# Patient Record
Sex: Male | Born: 1960
Health system: Southern US, Community
[De-identification: ages and names within clinical notes are randomized; demographics above are authoritative.]

## PROBLEM LIST (undated history)

## (undated) DIAGNOSIS — I10 Essential (primary) hypertension: Secondary | ICD-10-CM

## (undated) DIAGNOSIS — E119 Type 2 diabetes mellitus without complications: Secondary | ICD-10-CM

## (undated) DIAGNOSIS — C169 Malignant neoplasm of stomach, unspecified: Secondary | ICD-10-CM

## (undated) DIAGNOSIS — E785 Hyperlipidemia, unspecified: Secondary | ICD-10-CM

## (undated) DIAGNOSIS — C801 Malignant (primary) neoplasm, unspecified: Secondary | ICD-10-CM

## (undated) HISTORY — DX: Malignant (primary) neoplasm, unspecified: C80.1

## (undated) HISTORY — DX: Malignant neoplasm of stomach, unspecified: C16.9

## (undated) HISTORY — DX: Essential (primary) hypertension: I10

## (undated) HISTORY — PX: LAPAROSCOPIC SIGMOID COLECTOMY: SHX5928

## (undated) HISTORY — PX: LAPAROSCOPIC CHOLECYSTECTOMY: SUR755

## (undated) HISTORY — PX: APPENDECTOMY: SHX54

---

## 1998-06-03 ENCOUNTER — Emergency Department (HOSPITAL_COMMUNITY): Admission: EM | Admit: 1998-06-03 | Discharge: 1998-06-03 | Payer: Self-pay | Admitting: Emergency Medicine

## 1998-06-04 ENCOUNTER — Encounter: Admission: RE | Admit: 1998-06-04 | Discharge: 1998-09-02 | Payer: Self-pay | Admitting: Internal Medicine

## 1999-01-26 ENCOUNTER — Emergency Department (HOSPITAL_COMMUNITY): Admission: EM | Admit: 1999-01-26 | Discharge: 1999-01-26 | Payer: Self-pay

## 1999-12-29 ENCOUNTER — Emergency Department (HOSPITAL_COMMUNITY): Admission: EM | Admit: 1999-12-29 | Discharge: 1999-12-29 | Payer: Self-pay | Admitting: Emergency Medicine

## 2000-04-20 ENCOUNTER — Emergency Department (HOSPITAL_COMMUNITY): Admission: EM | Admit: 2000-04-20 | Discharge: 2000-04-20 | Payer: Self-pay | Admitting: Emergency Medicine

## 2000-04-20 ENCOUNTER — Encounter: Payer: Self-pay | Admitting: Emergency Medicine

## 2006-05-10 ENCOUNTER — Encounter: Admission: RE | Admit: 2006-05-10 | Discharge: 2006-05-10 | Payer: Self-pay | Admitting: General Surgery

## 2006-05-28 ENCOUNTER — Encounter: Admission: RE | Admit: 2006-05-28 | Discharge: 2006-05-28 | Payer: Self-pay | Admitting: General Surgery

## 2006-07-24 ENCOUNTER — Encounter (INDEPENDENT_AMBULATORY_CARE_PROVIDER_SITE_OTHER): Payer: Self-pay | Admitting: Specialist

## 2006-07-24 ENCOUNTER — Inpatient Hospital Stay (HOSPITAL_COMMUNITY): Admission: RE | Admit: 2006-07-24 | Discharge: 2006-07-31 | Payer: Self-pay | Admitting: General Surgery

## 2006-08-04 ENCOUNTER — Emergency Department (HOSPITAL_COMMUNITY): Admission: EM | Admit: 2006-08-04 | Discharge: 2006-08-04 | Payer: Self-pay | Admitting: Emergency Medicine

## 2007-03-14 ENCOUNTER — Emergency Department (HOSPITAL_COMMUNITY): Admission: EM | Admit: 2007-03-14 | Discharge: 2007-03-15 | Payer: Self-pay | Admitting: Emergency Medicine

## 2011-03-17 NOTE — Op Note (Signed)
Walter Phillips, STEARNS NO.:  [id number]   MEDICAL RECORD NO.:  [id number]          PATIENT TYPE:  INP   LOCATION:  0006                         FACILITY:  [facility]   PHYSICIAN:  Gita Kudo, M.D. DATE OF BIRTH:  1961/03/23   DATE OF PROCEDURE:  07/24/2006  DATE OF DISCHARGE:                                 OPERATIVE REPORT   OPERATIVE PROCEDURE:  Sigmoid colon resection.   SURGEON:  Dr. Maryagnes Amos.   ASSISTANT:  Dr. Derrell Lolling.   ANESTHESIA:  General endotracheal.   PREOPERATIVE DIAGNOSIS:  Sigmoid diverticulitis.   POSTOPERATIVE DIAGNOSIS:  Sigmoid diverticulitis with perforation and  adherence to the bladder, no apparent bladder fistula.   CLINICAL SUMMARY:  A 50 year old male brought in for elective surgery.  At  the laparoscopic cholecystectomy earlier this year, a mass was felt.  He was  asymptomatic.  It was worked up and felt to be a sigmoid diverticular  abscess with a little perforation.  He comes in for elective surgery now  approximately six to eight weeks later.   OPERATIVE FINDINGS:  The patient intense inflammatory mass in the sigmoid  colon that was very adherent to the bladder.   The pathologist inspected after it was removed and felt it was benign and  not malignant.  His other laparotomy was normal.  The gallbladder was  surgically absent, and there were markedly few adhesions.  She had a  previous appendix and very little adhesions from that also.  The ureters  were identified on both sides.  There was no leakage of blue dye that was  injected intravenously to check on bladder integrity.   OPERATIVE PROCEDURE:  Under satisfactory general endotracheal anesthesia,  having received both Lovenox and antibiotics preoperative, the patient was  positioned, prepped and draped in standard fashion.  PAS hose were applied.   The Foley catheter and nasogastric tubes had been inserted.  A midline  incision was made, and laparotomy revealed the findings  above.   The lateral reflections of the sigmoid were taken down with cautery.  The  colon was mobilized.  I removed as much of the colon from the bladder as  possible by gentle sharp and blunt dissection and then ended up using a  scalpel to cut away the inflammatory mass without entering the bladder.  There was some bleeding in the bladder that was controlled with suture.   Then, the colon was resected.  Proximally, the bowel was divided between  bowel clamps and then the mesentery taken between clamps and ties of 2-0  silk down to beyond the inflammatory mass.  Stay sutures of silk were placed  and the bowel clamped and removed.  It was inspected grossly and sent for  pathology.  Then, an anastomosis was performed with a single layer of 3-0  and 2-0 silk sutures of mattress, Gambee and simple type.  When completed,  the anastomosis lay without tension, had good blood supply and was patent.   There was no real mesentery to approximate.  The abdomen was lavaged with  saline.  The bladder was inspected and no  leakage of blue dye noted.  Two  Vicryl sutures were placed to help cover over the denuded area of bladder.  Then, the pelvis was drained with a #19 Blake drain, let out a right lower  quadrant stab wound.  The anastomosis did not lie the near the bladder  adherence.  It was actually placed distal to it in the pelvis.  Then, the  abdomen was closed in a single layer with running double-stranded #1 PDS  suture and the skin edges approximated with staples.  There were no  complications, and the sponge and needle counts were correct.           ______________________________  Gita Kudo, M.D.     MRL/MEDQ  D:  07/24/2006  T:  07/26/2006  Job:  [id number]

## 2011-03-17 NOTE — Discharge Summary (Signed)
Walter Phillips, PERNELL NO.:  [id number]   MEDICAL RECORD NO.:  [id number]          PATIENT TYPE:  INP   LOCATION:  1613                         FACILITY:  [facility]   PHYSICIAN:  Gita Kudo, M.D. DATE OF BIRTH:  [birth year]   DATE OF ADMISSION:  07/24/2006  DATE OF DISCHARGE:  07/31/2006                                 DISCHARGE SUMMARY   CHIEF COMPLAINT:  Sigmoid diverticulitis with some bladder symptoms.   HISTORY OF PRESENT ILLNESS:  [name] is a very nice 50 year old man who  underwent a laparoscopic cholecystectomy at [facility] on May 07, 2006.  The patient's exam at that time revealed a mass, and the laparoscopic  cholecystectomy was completed without complication.  The intraoperative  cholangiogram showed there might of been a tiny stone in the distal duct.  He was discharged to be followed as an outpatient.  In the interim, he  underwent CT scan, and we did indeed see severe diverticular disease with a  mass that looked very close to the bladder.  His liver functions remained  normal, and we just followed him up with a BE in preparation for surgery,  and he comes in now for that procedure having had a good outpatient bowel  prep.   LABORATORY STUDIES:  Pathology: Section of sigmoid colon with severe  diverticulitis and a pericolonic abscess.  No malignancy, four benign lymph  nodes.  Initial hemoglobin 15, hematocrit 43, white count 6300.  Followup 14  and 39.  His CMETs were totally normal except for a slightly low postop of  sodium that was corrected.  Glucose were slightly elevated at 105 and 117.  His LFTs showed alkaline phosphatase of 130 and the bilirubin of 1.3.   HOSPITAL COURSE:  On the morning of admission, the patient underwent a  sigmoid resection.  Of note is the fact that the colon was quite adherent to  the bladder, and it had to be removed by scalpel dissection.  The report  showed that it was benign.  The patient had a primary  anastomosis.  His  Foley catheter was left in for some time.  He continued to do quite well and  was improving to the point when he was discharged on the seventh postop day.  He had regained bowel function, was voiding, comfortable and had no problems  with his incision.  He will be followed as an outpatient.   DISCHARGE DIAGNOSES:  1. Sigmoid diverticulitis with adherence to the bladder.  2. Slightly elevated liver function studies, approximately 4 months post      laparoscopic cholecystectomy with intraoperative cholangiogram.   OPERATIONS:  Sigmoid resection.   COMPLICATIONS:  Infections.   CONSULTATIONS:  None.   CONDITION ON DISCHARGE:  Good.           ______________________________  Gita Kudo, M.D.     MRL/MEDQ  D:  08/13/2006  T:  08/13/2006  Job:  [id number]   cc:   Gabriel Earing, M.D.  Fax: [phone]   Barbette Hair. Arlyce Dice, MD,FACG  [address]  [place]  [place] [address]

## 2014-01-26 ENCOUNTER — Other Ambulatory Visit (INDEPENDENT_AMBULATORY_CARE_PROVIDER_SITE_OTHER): Payer: Self-pay

## 2014-01-26 ENCOUNTER — Telehealth (INDEPENDENT_AMBULATORY_CARE_PROVIDER_SITE_OTHER): Payer: Self-pay

## 2014-01-26 ENCOUNTER — Ambulatory Visit (INDEPENDENT_AMBULATORY_CARE_PROVIDER_SITE_OTHER): Payer: 59 | Admitting: General Surgery

## 2014-01-26 ENCOUNTER — Encounter (INDEPENDENT_AMBULATORY_CARE_PROVIDER_SITE_OTHER): Payer: Self-pay | Admitting: General Surgery

## 2014-01-26 VITALS — BP 128/82 | HR 78 | Temp 97.8°F | Resp 16 | Ht 62.0 in | Wt 173.2 lb

## 2014-01-26 DIAGNOSIS — M6200 Separation of muscle (nontraumatic), unspecified site: Secondary | ICD-10-CM

## 2014-01-26 DIAGNOSIS — M6208 Separation of muscle (nontraumatic), other site: Secondary | ICD-10-CM

## 2014-01-26 NOTE — Progress Notes (Signed)
Patient ID: Walter Phillips, male   DOB: [birth year], 53 y.o.   MRN: [id number]  Chief Complaint  Patient presents with  . New Evaluation    Eval abd Hernia    HPI Walter Phillips is a 53 y.o. male.  The patient is a 53 year old Spanish-speaking male who is referred by Dr. Jimmye Norman for evaluation of a possible ventral hernia. The patient underwent and bowel resection in 2007. Patient has had no pain or the symptoms on his anterior abdominal wall.  HPI  Past Medical History  Diagnosis Date  . Cancer   . Stomach cancer     History reviewed. No pertinent past surgical history.  Family History  Problem Relation Age of Onset  . Cancer Father   . Cancer Sister 27    stomach    Social History History  Substance Use Topics  . Smoking status: Current Some Day Smoker -- 0.50 packs/day  . Smokeless tobacco: Not on file  . Alcohol Use: Yes     Comment: occ    No Known Allergies  Current Outpatient Prescriptions  Medication Sig Dispense Refill  . betamethasone dipropionate 0.05 % lotion       . cephALEXin (KEFLEX) 500 MG capsule       . clobetasol (TEMOVATE) 0.05 % external solution       . cyclobenzaprine (FLEXERIL) 10 MG tablet       . fenofibrate (TRICOR) 145 MG tablet       . glimepiride (AMARYL) 2 MG tablet       . metFORMIN (GLUCOPHAGE) 500 MG tablet       . naproxen (NAPROSYN) 500 MG tablet        No current facility-administered medications for this visit.    Review of Systems Review of Systems  Constitutional: Negative.   HENT: Negative.   Eyes: Negative.   Respiratory: Negative.   Cardiovascular: Negative.   Gastrointestinal: Negative.   Endocrine: Negative.   Neurological: Negative.     Blood pressure 128/82, pulse 78, temperature 97.8 F (36.6 C), temperature source Temporal, resp. rate 16, height 5\' 2"  (1.575 m), weight 173 lb 3.2 oz (78.563 kg).  Physical Exam Physical Exam  Constitutional: He is oriented to person, place, and time. He appears  well-developed and well-nourished.  HENT:  Head: Normocephalic and atraumatic.  Eyes: Conjunctivae and EOM are normal. Pupils are equal, round, and reactive to light.  Neck: Normal range of motion. Neck supple.  Cardiovascular: Normal rate, regular rhythm and normal heart sounds.   Pulmonary/Chest: Effort normal and breath sounds normal.  Abdominal: Soft. Bowel sounds are normal. He exhibits no distension and no mass. There is no tenderness. There is no rebound and no guarding. Hernia confirmed negative in the ventral area.    Well-healed incision  Musculoskeletal: Normal range of motion.  Neurological: He is alert and oriented to person, place, and time.  Skin: Skin is warm and dry.    Data Reviewed none  Assessment    53 year old male with rectus diastases, no incisional hernia     Plan    1. The patient follow up as needed        Rosario Jacks., Anne Hahn 01/26/2014, 3:15 PM

## 2014-01-26 NOTE — Telephone Encounter (Signed)
Office notes faxed to [facility] attn: Dr. Jimmye Norman

## 2014-02-16 ENCOUNTER — Encounter (INDEPENDENT_AMBULATORY_CARE_PROVIDER_SITE_OTHER): Payer: Self-pay

## 2014-04-17 DIAGNOSIS — K439 Ventral hernia without obstruction or gangrene: Secondary | ICD-10-CM | POA: Insufficient documentation

## 2014-05-27 HISTORY — PX: VENTRAL HERNIA REPAIR: SHX424

## 2018-04-24 ENCOUNTER — Ambulatory Visit: Payer: Self-pay | Admitting: Family Medicine

## 2019-10-15 ENCOUNTER — Emergency Department (HOSPITAL_COMMUNITY): Payer: HRSA Program

## 2019-10-15 ENCOUNTER — Other Ambulatory Visit: Payer: Self-pay

## 2019-10-15 ENCOUNTER — Encounter (HOSPITAL_COMMUNITY): Payer: Self-pay | Admitting: Internal Medicine

## 2019-10-15 ENCOUNTER — Inpatient Hospital Stay (HOSPITAL_COMMUNITY)
Admission: EM | Admit: 2019-10-15 | Discharge: 2019-10-23 | DRG: 177 | Disposition: A | Discharge status: Home / Self Care | Payer: HRSA Program | Attending: Internal Medicine | Admitting: Internal Medicine

## 2019-10-15 DIAGNOSIS — Z791 Long term (current) use of non-steroidal anti-inflammatories (NSAID): Secondary | ICD-10-CM

## 2019-10-15 DIAGNOSIS — U071 COVID-19: Secondary | ICD-10-CM | POA: Diagnosis present

## 2019-10-15 DIAGNOSIS — Z809 Family history of malignant neoplasm, unspecified: Secondary | ICD-10-CM

## 2019-10-15 DIAGNOSIS — J9611 Chronic respiratory failure with hypoxia: Secondary | ICD-10-CM

## 2019-10-15 DIAGNOSIS — Z933 Colostomy status: Secondary | ICD-10-CM | POA: Diagnosis not present

## 2019-10-15 DIAGNOSIS — Z7984 Long term (current) use of oral hypoglycemic drugs: Secondary | ICD-10-CM

## 2019-10-15 DIAGNOSIS — Z85028 Personal history of other malignant neoplasm of stomach: Secondary | ICD-10-CM

## 2019-10-15 DIAGNOSIS — J1282 Pneumonia due to coronavirus disease 2019: Secondary | ICD-10-CM

## 2019-10-15 DIAGNOSIS — Z8616 Personal history of COVID-19: Secondary | ICD-10-CM | POA: Diagnosis present

## 2019-10-15 DIAGNOSIS — J1289 Other viral pneumonia: Secondary | ICD-10-CM | POA: Diagnosis present

## 2019-10-15 DIAGNOSIS — A4189 Other specified sepsis: Secondary | ICD-10-CM | POA: Diagnosis present

## 2019-10-15 DIAGNOSIS — A419 Sepsis, unspecified organism: Secondary | ICD-10-CM | POA: Diagnosis present

## 2019-10-15 DIAGNOSIS — Z79899 Other long term (current) drug therapy: Secondary | ICD-10-CM

## 2019-10-15 DIAGNOSIS — E785 Hyperlipidemia, unspecified: Secondary | ICD-10-CM | POA: Diagnosis present

## 2019-10-15 DIAGNOSIS — Z23 Encounter for immunization: Secondary | ICD-10-CM

## 2019-10-15 DIAGNOSIS — F17210 Nicotine dependence, cigarettes, uncomplicated: Secondary | ICD-10-CM | POA: Diagnosis present

## 2019-10-15 DIAGNOSIS — R0902 Hypoxemia: Secondary | ICD-10-CM

## 2019-10-15 DIAGNOSIS — E1165 Type 2 diabetes mellitus with hyperglycemia: Secondary | ICD-10-CM

## 2019-10-15 DIAGNOSIS — J9601 Acute respiratory failure with hypoxia: Secondary | ICD-10-CM | POA: Diagnosis present

## 2019-10-15 DIAGNOSIS — Z9049 Acquired absence of other specified parts of digestive tract: Secondary | ICD-10-CM

## 2019-10-15 DIAGNOSIS — E119 Type 2 diabetes mellitus without complications: Secondary | ICD-10-CM

## 2019-10-15 HISTORY — DX: Hyperlipidemia, unspecified: E78.5

## 2019-10-15 HISTORY — DX: Chronic respiratory failure with hypoxia: J96.11

## 2019-10-15 HISTORY — DX: Type 2 diabetes mellitus without complications: E11.9

## 2019-10-15 LAB — POCT I-STAT 7, (LYTES, BLD GAS, ICA,H+H)
Acid-base deficit: 2 mmol/L (ref 0.0–2.0)
Bicarbonate: 19.8 mmol/L — ABNORMAL LOW (ref 20.0–28.0)
Calcium, Ion: 1.18 mmol/L (ref 1.15–1.40)
HCT: 39 % (ref 39.0–52.0)
Hemoglobin: 13.3 g/dL (ref 13.0–17.0)
O2 Saturation: 93 %
Patient temperature: 98.3
Potassium: 3.8 mmol/L (ref 3.5–5.1)
Sodium: 137 mmol/L (ref 135–145)
TCO2: 21 mmol/L — ABNORMAL LOW (ref 22–32)
pCO2 arterial: 26.9 mmHg — ABNORMAL LOW (ref 32.0–48.0)
pH, Arterial: 7.476 — ABNORMAL HIGH (ref 7.350–7.450)
pO2, Arterial: 60 mmHg — ABNORMAL LOW (ref 83.0–108.0)

## 2019-10-15 LAB — CBC WITH DIFFERENTIAL/PLATELET
Abs Immature Granulocytes: 0.21 10*3/uL — ABNORMAL HIGH (ref 0.00–0.07)
Basophils Absolute: 0 10*3/uL (ref 0.0–0.1)
Basophils Relative: 0 %
Eosinophils Absolute: 0 10*3/uL (ref 0.0–0.5)
Eosinophils Relative: 0 %
HCT: 42.7 % (ref 39.0–52.0)
Hemoglobin: 14.6 g/dL (ref 13.0–17.0)
Immature Granulocytes: 2 %
Lymphocytes Relative: 7 %
Lymphs Abs: 0.8 10*3/uL (ref 0.7–4.0)
MCH: 30.5 pg (ref 26.0–34.0)
MCHC: 34.2 g/dL (ref 30.0–36.0)
MCV: 89.1 fL (ref 80.0–100.0)
Monocytes Absolute: 0.3 10*3/uL (ref 0.1–1.0)
Monocytes Relative: 3 %
Neutro Abs: 10 10*3/uL — ABNORMAL HIGH (ref 1.7–7.7)
Neutrophils Relative %: 88 %
Platelets: 386 10*3/uL (ref 150–400)
RBC: 4.79 MIL/uL (ref 4.22–5.81)
RDW: 12.3 % (ref 11.5–15.5)
WBC: 11.3 10*3/uL — ABNORMAL HIGH (ref 4.0–10.5)
nRBC: 0.2 % (ref 0.0–0.2)

## 2019-10-15 LAB — GLUCOSE, CAPILLARY
Glucose-Capillary: 231 mg/dL — ABNORMAL HIGH (ref 70–99)
Glucose-Capillary: 243 mg/dL — ABNORMAL HIGH (ref 70–99)

## 2019-10-15 LAB — CBG MONITORING, ED
Glucose-Capillary: 129 mg/dL — ABNORMAL HIGH (ref 70–99)
Glucose-Capillary: 190 mg/dL — ABNORMAL HIGH (ref 70–99)

## 2019-10-15 LAB — RESPIRATORY PANEL BY RT PCR (FLU A&B, COVID)
Influenza A by PCR: NEGATIVE
Influenza B by PCR: NEGATIVE
SARS Coronavirus 2 by RT PCR: POSITIVE — AB

## 2019-10-15 LAB — POC SARS CORONAVIRUS 2 AG -  ED: SARS Coronavirus 2 Ag: NEGATIVE

## 2019-10-15 LAB — COMPREHENSIVE METABOLIC PANEL
ALT: 56 U/L — ABNORMAL HIGH (ref 0–44)
AST: 44 U/L — ABNORMAL HIGH (ref 15–41)
Albumin: 2.9 g/dL — ABNORMAL LOW (ref 3.5–5.0)
Alkaline Phosphatase: 70 U/L (ref 38–126)
Anion gap: 13 (ref 5–15)
BUN: 15 mg/dL (ref 6–20)
CO2: 19 mmol/L — ABNORMAL LOW (ref 22–32)
Calcium: 8.7 mg/dL — ABNORMAL LOW (ref 8.9–10.3)
Chloride: 104 mmol/L (ref 98–111)
Creatinine, Ser: 0.52 mg/dL — ABNORMAL LOW (ref 0.61–1.24)
GFR calc Af Amer: >60 mL/min (ref >60)
GFR calc non Af Amer: >60 mL/min (ref >60)
Glucose, Bld: 141 mg/dL — ABNORMAL HIGH (ref 70–99)
Potassium: 3.9 mmol/L (ref 3.5–5.1)
Sodium: 136 mmol/L (ref 135–145)
Total Bilirubin: 1.2 mg/dL (ref 0.3–1.2)
Total Protein: 6.9 g/dL (ref 6.5–8.1)

## 2019-10-15 LAB — EXPECTORATED SPUTUM ASSESSMENT W GRAM STAIN, RFLX TO RESP C

## 2019-10-15 LAB — ABO/RH: ABO/RH(D): O POS

## 2019-10-15 LAB — PROCALCITONIN
Procalcitonin: <0.1 ng/mL
Procalcitonin: <0.1 ng/mL

## 2019-10-15 LAB — BRAIN NATRIURETIC PEPTIDE: B Natriuretic Peptide: 39.4 pg/mL (ref 0.0–100.0)

## 2019-10-15 LAB — FIBRINOGEN: Fibrinogen: 582 mg/dL — ABNORMAL HIGH (ref 210–475)

## 2019-10-15 LAB — LACTIC ACID, PLASMA
Lactic Acid, Venous: 2.3 mmol/L (ref 0.5–1.9)
Lactic Acid, Venous: 1.8 mmol/L (ref 0.5–1.9)

## 2019-10-15 LAB — HIV ANTIBODY (ROUTINE TESTING W REFLEX): HIV Screen 4th Generation wRfx: NONREACTIVE

## 2019-10-15 LAB — TRIGLYCERIDES: Triglycerides: 274 mg/dL — ABNORMAL HIGH (ref <150)

## 2019-10-15 LAB — C-REACTIVE PROTEIN: CRP: 3.8 mg/dL — ABNORMAL HIGH (ref <1.0)

## 2019-10-15 LAB — LACTATE DEHYDROGENASE: LDH: 590 U/L — ABNORMAL HIGH (ref 98–192)

## 2019-10-15 LAB — FERRITIN: Ferritin: 1382 ng/mL — ABNORMAL HIGH (ref 24–336)

## 2019-10-15 LAB — D-DIMER, QUANTITATIVE: D-Dimer, Quant: 2.66 ug/mL-FEU — ABNORMAL HIGH (ref 0.00–0.50)

## 2019-10-15 MED ORDER — ONDANSETRON HCL 4 MG PO TABS: 4.0000 mg | ORAL_TABLET | Freq: Four times a day (QID) | ORAL | Status: DC | PRN

## 2019-10-15 MED ORDER — ZINC SULFATE 220 (50 ZN) MG PO CAPS
220.0000 mg | ORAL_CAPSULE | Freq: Every day | ORAL | Status: DC
  Administered 2019-10-15 – 2019-10-23 (×9): 220 mg via ORAL
  Filled 2019-10-15 (×9): qty 1

## 2019-10-15 MED ORDER — ACETAMINOPHEN 325 MG PO TABS: 650.0000 mg | ORAL_TABLET | Freq: Four times a day (QID) | ORAL | Status: DC | PRN

## 2019-10-15 MED ORDER — ALBUTEROL SULFATE HFA 108 (90 BASE) MCG/ACT IN AERS
2.0000 | INHALATION_SPRAY | Freq: Four times a day (QID) | RESPIRATORY_TRACT | Status: DC
  Administered 2019-10-15 – 2019-10-23 (×31): 2 via RESPIRATORY_TRACT
  Filled 2019-10-15 (×2): qty 6.7

## 2019-10-15 MED ORDER — ENOXAPARIN SODIUM 40 MG/0.4ML ~~LOC~~ SOLN
40.0000 mg | SUBCUTANEOUS | Status: DC
  Administered 2019-10-15 – 2019-10-16 (×2): 40 mg via SUBCUTANEOUS
  Filled 2019-10-15 (×2): qty 0.4

## 2019-10-15 MED ORDER — DEXAMETHASONE SODIUM PHOSPHATE 10 MG/ML IJ SOLN: 6.0000 mg | INTRAMUSCULAR | Status: DC

## 2019-10-15 MED ORDER — INSULIN ASPART 100 UNIT/ML ~~LOC~~ SOLN
0.0000 [IU] | SUBCUTANEOUS | Status: DC
  Administered 2019-10-15: 13:00:00 2 [IU] via SUBCUTANEOUS
  Administered 2019-10-15: 1 [IU] via SUBCUTANEOUS
  Administered 2019-10-15 (×2): 3 [IU] via SUBCUTANEOUS

## 2019-10-15 MED ORDER — DEXAMETHASONE SODIUM PHOSPHATE 10 MG/ML IJ SOLN
6.0000 mg | Freq: Once | INTRAMUSCULAR | Status: AC
  Administered 2019-10-15: 6 mg via INTRAVENOUS
  Filled 2019-10-15: qty 1

## 2019-10-15 MED ORDER — ENOXAPARIN SODIUM 40 MG/0.4ML ~~LOC~~ SOLN: 40.0000 mg | SUBCUTANEOUS | Status: DC

## 2019-10-15 MED ORDER — DIPHENHYDRAMINE HCL 25 MG PO CAPS
25.0000 mg | ORAL_CAPSULE | Freq: Once | ORAL | Status: AC
  Administered 2019-10-16: 25 mg via ORAL
  Filled 2019-10-15: qty 1

## 2019-10-15 MED ORDER — HYDROCOD POLST-CPM POLST ER 10-8 MG/5ML PO SUER
5.0000 mL | Freq: Two times a day (BID) | ORAL | Status: DC | PRN
  Administered 2019-10-20: 5 mL via ORAL
  Filled 2019-10-15: qty 5

## 2019-10-15 MED ORDER — ALUM & MAG HYDROXIDE-SIMETH 200-200-20 MG/5ML PO SUSP
15.0000 mL | ORAL | Status: DC | PRN
  Administered 2019-10-15: 15 mL via ORAL
  Filled 2019-10-15 (×2): qty 30

## 2019-10-15 MED ORDER — SODIUM CHLORIDE 0.9 % IV SOLN
200.0000 mg | Freq: Once | INTRAVENOUS | Status: AC
  Administered 2019-10-15: 200 mg via INTRAVENOUS
  Filled 2019-10-15: qty 40

## 2019-10-15 MED ORDER — ONDANSETRON HCL 4 MG/2ML IJ SOLN: 4.0000 mg | Freq: Four times a day (QID) | INTRAMUSCULAR | Status: DC | PRN

## 2019-10-15 MED ORDER — SODIUM CHLORIDE 0.9 % IV SOLN
100.0000 mg | Freq: Every day | INTRAVENOUS | Status: AC
  Administered 2019-10-16 – 2019-10-19 (×4): 100 mg via INTRAVENOUS
  Filled 2019-10-15 (×5): qty 20

## 2019-10-15 MED ORDER — ASCORBIC ACID 500 MG PO TABS
500.0000 mg | ORAL_TABLET | Freq: Every day | ORAL | Status: DC
  Administered 2019-10-15 – 2019-10-23 (×9): 500 mg via ORAL
  Filled 2019-10-15 (×9): qty 1

## 2019-10-15 MED ORDER — FAMOTIDINE 20 MG PO TABS
20.0000 mg | ORAL_TABLET | Freq: Two times a day (BID) | ORAL | Status: DC
  Administered 2019-10-15 – 2019-10-23 (×17): 20 mg via ORAL
  Filled 2019-10-15 (×17): qty 1

## 2019-10-15 MED ORDER — GUAIFENESIN-DM 100-10 MG/5ML PO SYRP
10.0000 mL | ORAL_SOLUTION | ORAL | Status: DC | PRN
  Administered 2019-10-18 – 2019-10-21 (×4): 10 mL via ORAL
  Filled 2019-10-15 (×4): qty 10

## 2019-10-15 NOTE — ED Notes (Signed)
Lunch tray ordered 

## 2019-10-15 NOTE — ED Triage Notes (Signed)
Patient has been sick x3 weeks; saw his dr and was placed on abx. Tested for Covid yesterday and was positive.

## 2019-10-15 NOTE — ED Notes (Signed)
CBG 129 

## 2019-10-15 NOTE — ED Provider Notes (Signed)
[name] EMERGENCY DEPARTMENT Provider Note   CSN: [id number] Arrival date & time: 10/15/19  [id number]     History Chief Complaint  Patient presents with  . Covid/SOB    Walter Phillips is a 58 y.o. male.  The history is provided by the patient.  Shortness of Breath Severity:  Moderate Onset quality:  Gradual Duration:  3 weeks Timing:  Constant Progression:  Worsening Chronicity:  New Relieved by:  Nothing Worsened by:  Nothing Associated symptoms: chest pain and cough   Patient with history of diabetes and hyperlipidemia presents with cough and shortness of breath.  He reports over 2 weeks ago he began having cough symptoms.  Reports his PCP placed on antibiotics.  He never improved so he was tested for COVID-19 yesterday and was told he was positive  Today he reports worsening shortness of breath.  No fever.  He has had chest pain but none currently. No fevers today     Past Medical History:  Diagnosis Date  . Cancer   . Stomach cancer     There are no problems to display for this patient.   No past surgical history on file.     Family History  Problem Relation Age of Onset  . Cancer Father   . Cancer Sister 32       stomach    Social History   Tobacco Use  . Smoking status: Current Some Day Smoker    Packs/day: 0.50  Substance Use Topics  . Alcohol use: Yes    Comment: occ  . Drug use: No    Home Medications Prior to Admission medications   Medication Sig Start Date End Date Taking? Authorizing Provider  betamethasone dipropionate 0.05 % lotion  12/22/13   [provider]  cephALEXin (KEFLEX) 500 MG capsule  01/10/14   [provider]  clobetasol (TEMOVATE) 0.05 % external solution  01/16/14   [provider]  cyclobenzaprine (FLEXERIL) 10 MG tablet  12/22/13   [provider]  fenofibrate (TRICOR) 145 MG tablet  12/22/13   [provider]  glimepiride (AMARYL) 2 MG tablet  12/22/13    [provider]  metFORMIN (GLUCOPHAGE) 500 MG tablet  12/22/13   [provider]  naproxen (NAPROSYN) 500 MG tablet  01/10/14   [provider]    Allergies    Patient has no known allergies.  Review of Systems   Review of Systems  Respiratory: Positive for cough and shortness of breath.   Cardiovascular: Positive for chest pain.  All other systems reviewed and are negative.   Physical Exam Updated Vital Signs BP 126/75   Pulse 86   Temp 98.3 F (36.8 C) (Oral)   Resp (!) 26   SpO2 91%  Pulse ox 70s on room air per nursing Physical Exam CONSTITUTIONAL: Well developed/well nourished HEAD: Normocephalic/atraumatic EYES: EOMI ENMT: Mask in place NECK: supple no meningeal signs SPINE/BACK:entire spine nontender CV: S1/S2 noted, no murmurs/rubs/gallops noted LUNGS: Tachypnea, crackles bilaterally ABDOMEN: soft, nontender NEURO: Pt is awake/alert/appropriate, moves all extremitiesx4.  No facial droop.   EXTREMITIES: pulses normal/equal, full ROM, no lower extremity SKIN: warm, color normal PSYCH: no abnormalities of mood noted, alert and oriented to situation  ED Results / Procedures / Treatments   Labs (all labs ordered are listed, but only abnormal results are displayed) Labs Reviewed  RESPIRATORY PANEL BY RT PCR (FLU A&B, COVID) - Abnormal; Notable for the following components:      Result Value  SARS Coronavirus 2 by RT PCR POSITIVE (*)    All other components within normal limits  LACTIC ACID, PLASMA - Abnormal; Notable for the following components:   Lactic Acid, Venous 2.3 (*)    All other components within normal limits  CBC WITH DIFFERENTIAL/PLATELET - Abnormal; Notable for the following components:   WBC 11.3 (*)    Neutro Abs 10.0 (*)    Abs Immature Granulocytes 0.21 (*)    All other components within normal limits  COMPREHENSIVE METABOLIC PANEL - Abnormal; Notable for the following components:   CO2 19 (*)    Glucose, Bld 141  (*)    Creatinine, Ser 0.52 (*)    Calcium 8.7 (*)    Albumin 2.9 (*)    AST 44 (*)    ALT 56 (*)    All other components within normal limits  D-DIMER, QUANTITATIVE (NOT AT [facility]) - Abnormal; Notable for the following components:   D-Dimer, Quant 2.66 (*)    All other components within normal limits  LACTATE DEHYDROGENASE - Abnormal; Notable for the following components:   LDH 590 (*)    All other components within normal limits  FERRITIN - Abnormal; Notable for the following components:   Ferritin 1,382 (*)    All other components within normal limits  TRIGLYCERIDES - Abnormal; Notable for the following components:   Triglycerides 274 (*)    All other components within normal limits  FIBRINOGEN - Abnormal; Notable for the following components:   Fibrinogen 582 (*)    All other components within normal limits  C-REACTIVE PROTEIN - Abnormal; Notable for the following components:   CRP 3.8 (*)    All other components within normal limits  CULTURE, BLOOD (ROUTINE X 2)  CULTURE, BLOOD (ROUTINE X 2)  LACTIC ACID, PLASMA  PROCALCITONIN  BRAIN NATRIURETIC PEPTIDE  POC SARS CORONAVIRUS 2 AG -  ED    EKG EKG Interpretation  Date/Time:  Wednesday October 15 2019 04:41:41 EST Ventricular Rate:  75 PR Interval:    QRS Duration: 89 QT Interval:  385 QTC Calculation: 430 R Axis:   43 Text Interpretation: Sinus rhythm Abnormal R-wave progression, early transition Confirmed by Ripley Fraise [phone]) on 10/15/2019 5:00:02 AM   Radiology DG Chest Port 1 View  Result Date: 10/15/2019 CLINICAL DATA:  Shortness of breath.  COVID-19 positive. EXAM: PORTABLE CHEST 1 VIEW COMPARISON:  Radiograph 03/14/2007 FINDINGS: Low lung volumes. Prominent heterogeneous opacities throughout both lungs, basilar predominant. Mild background fine interstitial opacities. Heart is normal in size. Normal mediastinal contours. No pleural fluid or pneumothorax. Overlying artifact projects over the chest.  IMPRESSION: Low lung volumes with prominent heterogeneous opacities throughout both lungs, consistent with pneumonia, pattern typical of COVID-19 . Background fine interstitial opacities which may represent emphysema. Electronically Signed   By: Keith Rake M.D.   On: 10/15/2019 05:21    Procedures .Critical Care Performed by: Ripley Fraise, MD Authorized by: Ripley Fraise, MD   Critical care provider statement:    Critical care time (minutes):  60   Critical care start time:  10/15/2019 5:00 AM   Critical care end time:  10/15/2019 6:00 AM   Critical care time was exclusive of:  Separately billable procedures and treating other patients   Critical care was necessary to treat or prevent imminent or life-threatening deterioration of the following conditions:  Respiratory failure and sepsis   Critical care was time spent personally by me on the following activities:  Evaluation of patient's response to treatment,  examination of patient, re-evaluation of patient's condition, pulse oximetry, ordering and review of radiographic studies, ordering and review of laboratory studies and ordering and performing treatments and interventions   I assumed direction of critical care for this patient from another provider in my specialty: no       Medications Ordered in ED Medications  dexamethasone (DECADRON) injection 6 mg (has no administration in time range)    ED Course  I have reviewed the triage vital signs and the nursing notes.  Pertinent labs & imaging results that were available during my care of the patient were reviewed by me and considered in my medical decision making (see chart for details).    MDM Rules/Calculators/A&P                      5:04 AM Patient reports he has been feeling unwell for 3 weeks.  He was given antibiotics without improvement. He reports he tested positive for COVID-19 yesterday.  He became more short of breath tonight.  Room air pulse ox was in the  70s, patient is currently maintaining in the 90s on 15 L/min Work-up is pending at this time 7:10 AM Patient with severe Covid pneumonia.  Patient is requiring high flow oxygen, as he desats to the 70s on room air Overall he is hemodynamically appropriate. remdesivir and Decadron been ordered. Patient requires admission Dr. Ronnald Nian to call report to the hospitalist  Walter Phillips was evaluated in Emergency Department on 10/15/2019 for the symptoms described in the history of present illness. He was evaluated in the context of the global COVID-19 pandemic, which necessitated consideration that the patient might be at risk for infection with the SARS-CoV-2 virus that causes COVID-19. Institutional protocols and algorithms that pertain to the evaluation of patients at risk for COVID-19 are in a state of rapid change based on information released by regulatory bodies including the CDC and federal and state organizations. These policies and algorithms were followed during the patient's care in the ED.  Final Clinical Impression(s) / ED Diagnoses Final diagnoses:  Pneumonia due to COVID-19 virus  Hypoxia    Rx / DC Orders ED Discharge Orders    None       Ripley Fraise, MD 10/15/19 [phone]

## 2019-10-15 NOTE — Progress Notes (Signed)
Patient arrived to unit on 100%  Non-rebreathe with saturation 87-91%. Changed to 15 liters HFNC pt desats to 80% so back on NRB mask reapplied. Pt currently 92%. No distress noted at this time. Will continue to monitor.

## 2019-10-15 NOTE — ED Notes (Signed)
Pt O2 sats drop when speaking to 86-89% on 15 lpm NRB. Md aware.

## 2019-10-15 NOTE — H&P (Signed)
History and Physical    Walter Phillips DOB: [birth year] DOA: 10/15/2019  Referring MD/NP/PA: Lennice Sites, MD PCP: Patient, No Pcp Per  Patient coming from: Home  Chief Complaint: Difficulty breathing  I have personally briefly reviewed patient's old medical records in [place]   HPI: Walter Phillips is a 58 y.o. male with medical history significant of diabetes mellitus type 2, hyperlipidemia, and diverticulitis s/p sigmoid colectomy.  History obtained with the use of Spanish interpretive services.  He presents with complaints of difficulty breathing.  Patient reports a 3-week history of symptoms.  He reports having 3-day history of subjective fever, sore throat, headache, diarrhea, nausea, cough, chest discomfort, and then shortness of breath within this time period.  Patient had decreased appetite due to his symptoms.  Seen by his primary care provider yesterday and given an injection in the office along with medications antibiotics and cough medicine.  He reports that the cough medicine had helped some, but he felt as though he was unable to take a deep breath in last night.  ED Course: On admission to the emergency department patient was noted to be afebrile, respiration 24-33, O2 saturations 88- 98% on 15 L nonrebreather, and all other vital signs maintained.  Labs significant for WBC 11.3, lactic acid 2.3, CRP 3.8, ferritin 1382, LDH 590, triglycerides 274, D-dimer 2.66, fibrinogen 582.  Chest x-ray showing bilateral opacities consistent with atypical pneumonia.  Influenza screen was negative, but was positive for Covid.  Blood cultures have been obtained and the patient was started on 6 mg of Decadron IV, and remdesivir.  Review of Systems  Constitutional: Positive for fever.  HENT: Positive for sore throat. Negative for hearing loss.   Eyes: Negative for photophobia and pain.  Respiratory: Positive for cough and shortness of breath.   Cardiovascular: Negative for  chest pain and leg swelling.  Gastrointestinal: Positive for diarrhea and nausea.  Genitourinary: Negative for dysuria and hematuria.  Musculoskeletal: Negative for falls.  Neurological: Positive for weakness. Negative for focal weakness and loss of consciousness.  Psychiatric/Behavioral: Negative for substance abuse.    Past Medical History:  Diagnosis Date  . Cancer ([place])   . Diabetes mellitus, type II ([place])   . Dyslipidemia   . 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 APPENDECTOMY    . LAPAROSCOPIC CHOLECYSTECTOMY    . LAPAROSCOPIC SIGMOID COLECTOMY    . VENTRAL HERNIA REPAIR  05/27/2014     reports that he has been smoking. He has been smoking about 0.50 packs per day. He does not have any smokeless tobacco history on file. He reports current alcohol use. He reports that he does not use drugs.  No Known Allergies  Family History  Problem Relation Age of Onset  . Cancer Father   . Cancer Sister 20       stomach    Prior to Admission medications   Medication Sig Start Date End Date Taking? Authorizing Provider  betamethasone dipropionate 0.05 % lotion  12/22/13   [provider]  cephALEXin (KEFLEX) 500 MG capsule  01/10/14   [provider]  clobetasol (TEMOVATE) 0.05 % external solution  01/16/14   [provider]  cyclobenzaprine (FLEXERIL) 10 MG tablet  12/22/13   [provider]  fenofibrate (TRICOR) 145 MG tablet  12/22/13   [provider]  glimepiride (AMARYL) 2 MG tablet  12/22/13   [provider]  metFORMIN (GLUCOPHAGE) 500 MG tablet  12/22/13   [provider]  naproxen (NAPROSYN) 500 MG tablet  01/10/14   [provider]    Physical Exam:  Constitutional: Older male currently in  respiratory distress. Vitals:   10/15/19 0519 10/15/19 0545 10/15/19 0630 10/15/19 0715  BP:  125/65 133/68 131/77  Pulse:  78 65 69  Resp:  (!) 26 (!) 28 (!) 21  Temp:       TempSrc:      SpO2: 90% (!) 89% 98% (!) 88%   Eyes: PERRL, lids and conjunctivae normal ENMT: Mucous membranes are dry. Posterior pharynx clear of any exudate or lesions.  Neck: normal, supple, no masses, no thyromegaly Respiratory: Tachypneic on 15 L high flow nasal cannula oxygen and nonrebreather on.  Positive crackles noted in the lower lung fields.  No significant wheezing appreciated.  Patient only able to talk in short 2-3 word sentences with O2 saturation dropping into the mid 80s. . Cardiovascular: Regular rate and rhythm, no murmurs / rubs / gallops. No extremity edema. 2+ pedal pulses. No carotid bruits.  Abdomen: no tenderness, no masses palpated. No hepatosplenomegaly. Bowel sounds positive.  Musculoskeletal: no clubbing / cyanosis. No joint deformity upper and lower extremities. Good ROM, no contractures. Normal muscle tone.  Skin: no rashes, lesions, ulcers. No induration Neurologic: CN 2-12 grossly intact. Sensation intact, DTR normal. Strength 4+/5 in all 4.  Psychiatric: Normal judgment and insight. Alert and oriented x 3. Normal mood.     Labs on Admission: I have personally reviewed following labs and imaging studies  CBC: Recent Labs  Lab 10/15/19 0458  WBC 11.3*  NEUTROABS 10.0*  HGB 14.6  HCT 42.7  MCV 89.1  PLT [id number]   Basic Metabolic Panel: Recent Labs  Lab 10/15/19 0458  NA 136  K 3.9  CL 104  CO2 19*  GLUCOSE 141*  BUN 15  CREATININE 0.52*  CALCIUM 8.7*   GFR: CrCl cannot be calculated (Unknown ideal weight.). Liver Function Tests: Recent Labs  Lab 10/15/19 0458  AST 44*  ALT 56*  ALKPHOS 70  BILITOT 1.2  PROT 6.9  ALBUMIN 2.9*   No results for input(s): LIPASE, AMYLASE in the last 168 hours. No results for input(s): AMMONIA in the last 168 hours. Coagulation Profile: No results for input(s): INR, PROTIME in the last 168 hours. Cardiac Enzymes: No results for input(s): CKTOTAL, CKMB, CKMBINDEX, TROPONINI in the last 168  hours. BNP (last 3 results) No results for input(s): PROBNP in the last 8760 hours. HbA1C: No results for input(s): HGBA1C in the last 72 hours. CBG: No results for input(s): GLUCAP in the last 168 hours. Lipid Profile: Recent Labs    10/15/19 0458  TRIG 274*   Thyroid Function Tests: No results for input(s): TSH, T4TOTAL, FREET4, T3FREE, THYROIDAB in the last 72 hours. Anemia Panel: Recent Labs    10/15/19 0458  FERRITIN 1,382*   Urine analysis: No results found for: COLORURINE, APPEARANCEUR, LABSPEC, PHURINE, GLUCOSEU, HGBUR, BILIRUBINUR, KETONESUR, PROTEINUR, UROBILINOGEN, NITRITE, LEUKOCYTESUR Sepsis Labs: Recent Results (from the past 240 hour(s))  Blood Culture (routine x 2)     Status: None (Preliminary result)   Collection Time: 10/15/19  4:58 AM   Specimen: BLOOD RIGHT FOREARM  Result Value Ref Range Status   Specimen Description BLOOD RIGHT FOREARM  Final   Special Requests   Final    BOTTLES DRAWN AEROBIC AND ANAEROBIC Blood Culture results may not be optimal due to an inadequate volume of blood received in culture bottles Performed  at [facility], [place] [address] [place], [place] [address]    Culture PENDING  Incomplete   Report Status PENDING  Incomplete  Respiratory Panel by RT PCR (Flu A&B, Covid) - Nasopharyngeal Swab     Status: Abnormal   Collection Time: 10/15/19  5:26 AM   Specimen: Nasopharyngeal Swab  Result Value Ref Range Status   SARS Coronavirus 2 by RT PCR POSITIVE (A) NEGATIVE Final    Comment: RESULT CALLED TO, READ BACK BY AND VERIFIED WITH: RN MORRIS, TAYLOR [place] [id number] FCP (NOTE) SARS-CoV-2 target nucleic acids are DETECTED. SARS-CoV-2 RNA is generally detectable in upper respiratory specimens  during the acute phase of infection. Positive results are indicative of the presence of the identified virus, but do not rule out bacterial infection or co-infection with other pathogens not detected by the test. Clinical correlation with  patient history and other diagnostic information is necessary to determine patient infection status. The expected result is Negative. Fact Sheet for Patients:  PinkCheek.be Fact Sheet for Healthcare Providers: GravelBags.it This test is not yet approved or cleared by the [place] FDA and  has been authorized for detection and/or diagnosis of SARS-CoV-2 by FDA under an Emergency Use Authorization (EUA).  This EUA will remain in effect (meaning this test can be used) for  the duration of  the COVID-19 declaration under Section 564(b)(1) of the Act, 21 U.S.C. section 360bbb-3(b)(1), unless the authorization is terminated or revoked sooner.    Influenza A by PCR NEGATIVE NEGATIVE Final   Influenza B by PCR NEGATIVE NEGATIVE Final    Comment: (NOTE) The Xpert Xpress SARS-CoV-2/FLU/RSV assay is intended as an aid in  the diagnosis of influenza from Nasopharyngeal swab specimens and  should not be used as a sole basis for treatment. Nasal washings and  aspirates are unacceptable for Xpert Xpress SARS-CoV-2/FLU/RSV  testing. Fact Sheet for Patients: PinkCheek.be Fact Sheet for Healthcare Providers: GravelBags.it This test is not yet approved or cleared by the [place] FDA and  has been authorized for detection and/or diagnosis of SARS-CoV-2 by  FDA under an Emergency Use Authorization (EUA). This EUA will remain  in effect (meaning this test can be used) for the duration of the  Covid-19 declaration under Section 564(b)(1) of the Act, 21  U.S.C. section 360bbb-3(b)(1), unless the authorization is  terminated or revoked. Performed at [facility], [place] [address], [place], [place] [address]      Radiological Exams on Admission: DG Chest Port 1 View  Result Date: 10/15/2019 CLINICAL DATA:  Shortness of breath.  COVID-19 positive. EXAM: PORTABLE CHEST 1 VIEW  COMPARISON:  Radiograph 03/14/2007 FINDINGS: Low lung volumes. Prominent heterogeneous opacities throughout both lungs, basilar predominant. Mild background fine interstitial opacities. Heart is normal in size. Normal mediastinal contours. No pleural fluid or pneumothorax. Overlying artifact projects over the chest. IMPRESSION: Low lung volumes with prominent heterogeneous opacities throughout both lungs, consistent with pneumonia, pattern typical of COVID-19 . Background fine interstitial opacities which may represent emphysema. Electronically Signed   By: Keith Rake M.D.   On: 10/15/2019 05:21    EKG: Independently reviewed.  Sinus rhythm at 75 bpm   Assessment/Plan Acute respiratory failure with hypoxia Sepsis secondary to pneumonia due to COVID-19: Acute.  Patient presents plaints of cough and shortness of breath.  Found to be tachypneic with WBC 11.3 and lactic acid 2.3.  Chest x-ray showing a bilateral opacities concerning for pneumonia.  Inflammatory markers elevated.  Patient has been given  Decadron and started on remdesivir. -Admit to a progressive bed at [facility] -COVID-19 admission order set utilized -Continuous pulse oximetry with oxygen as needed to maintain O2 sats greater than 90% -Follow-up procalcitonin -Follow-up blood and sputum cultures -Check ABG -Albuterol inhaler -Decadron 6 mg IV -Remdesivir per pharmacy -Antitussives as needed -Vitamin C and zinc -Continue to monitor inflammatory markers daily   Diabetes mellitus type 2: Patient on oral medications of metformin and Amaryl setting.  No recent hemoglobin A1c available. -Hypoglycemic protocol -Check hemoglobin A1c in a.m. -Hold oral agents -CBGs every 4 hours with sensitive SSI -Adjust insulin regimen as needed  Dyslipidemia -Continue fenofibrate  DVT prophylaxis: Lovenox Code Status: Full Family Communication: No family present at bedside Disposition Plan: Possible discharge home in3- 5 days Consults  called: None Admission status: Inpatient  Norval Morton MD [facility] Pager [phone]   If 7PM-7AM, please contact night-coverage [email or web address] Password [email or web address]  10/15/2019, 7:24 AM

## 2019-10-16 DIAGNOSIS — A419 Sepsis, unspecified organism: Secondary | ICD-10-CM

## 2019-10-16 DIAGNOSIS — E119 Type 2 diabetes mellitus without complications: Secondary | ICD-10-CM

## 2019-10-16 DIAGNOSIS — J9601 Acute respiratory failure with hypoxia: Secondary | ICD-10-CM

## 2019-10-16 DIAGNOSIS — J1289 Other viral pneumonia: Secondary | ICD-10-CM

## 2019-10-16 DIAGNOSIS — R6520 Severe sepsis without septic shock: Secondary | ICD-10-CM

## 2019-10-16 DIAGNOSIS — E1165 Type 2 diabetes mellitus with hyperglycemia: Secondary | ICD-10-CM

## 2019-10-16 DIAGNOSIS — U071 COVID-19: Principal | ICD-10-CM

## 2019-10-16 LAB — COMPREHENSIVE METABOLIC PANEL
ALT: 49 U/L — ABNORMAL HIGH (ref 0–44)
AST: 39 U/L (ref 15–41)
Albumin: 3 g/dL — ABNORMAL LOW (ref 3.5–5.0)
Alkaline Phosphatase: 75 U/L (ref 38–126)
Anion gap: 15 (ref 5–15)
BUN: 17 mg/dL (ref 6–20)
CO2: 21 mmol/L — ABNORMAL LOW (ref 22–32)
Calcium: 8.6 mg/dL — ABNORMAL LOW (ref 8.9–10.3)
Chloride: 101 mmol/L (ref 98–111)
Creatinine, Ser: 0.57 mg/dL — ABNORMAL LOW (ref 0.61–1.24)
GFR calc Af Amer: >60 mL/min (ref >60)
GFR calc non Af Amer: >60 mL/min (ref >60)
Glucose, Bld: 97 mg/dL (ref 70–99)
Potassium: 3.6 mmol/L (ref 3.5–5.1)
Sodium: 137 mmol/L (ref 135–145)
Total Bilirubin: 1.3 mg/dL — ABNORMAL HIGH (ref 0.3–1.2)
Total Protein: 7.3 g/dL (ref 6.5–8.1)

## 2019-10-16 LAB — CBC WITH DIFFERENTIAL/PLATELET
Abs Immature Granulocytes: 0.2 10*3/uL — ABNORMAL HIGH (ref 0.00–0.07)
Basophils Absolute: 0 10*3/uL (ref 0.0–0.1)
Basophils Relative: 0 %
Eosinophils Absolute: 0.1 10*3/uL (ref 0.0–0.5)
Eosinophils Relative: 1 %
HCT: 44.6 % (ref 39.0–52.0)
Hemoglobin: 15.3 g/dL (ref 13.0–17.0)
Immature Granulocytes: 2 %
Lymphocytes Relative: 5 %
Lymphs Abs: 0.6 10*3/uL — ABNORMAL LOW (ref 0.7–4.0)
MCH: 30.8 pg (ref 26.0–34.0)
MCHC: 34.3 g/dL (ref 30.0–36.0)
MCV: 89.7 fL (ref 80.0–100.0)
Monocytes Absolute: 0.2 10*3/uL (ref 0.1–1.0)
Monocytes Relative: 2 %
Neutro Abs: 12.1 10*3/uL — ABNORMAL HIGH (ref 1.7–7.7)
Neutrophils Relative %: 90 %
Platelets: 405 10*3/uL — ABNORMAL HIGH (ref 150–400)
RBC: 4.97 MIL/uL (ref 4.22–5.81)
RDW: 12.6 % (ref 11.5–15.5)
WBC: 13.3 10*3/uL — ABNORMAL HIGH (ref 4.0–10.5)
nRBC: 0.2 % (ref 0.0–0.2)

## 2019-10-16 LAB — GLUCOSE, CAPILLARY
Glucose-Capillary: 113 mg/dL — ABNORMAL HIGH (ref 70–99)
Glucose-Capillary: 120 mg/dL — ABNORMAL HIGH (ref 70–99)
Glucose-Capillary: 255 mg/dL — ABNORMAL HIGH (ref 70–99)
Glucose-Capillary: 86 mg/dL (ref 70–99)
Glucose-Capillary: 260 mg/dL — ABNORMAL HIGH (ref 70–99)
Glucose-Capillary: 220 mg/dL — ABNORMAL HIGH (ref 70–99)
Glucose-Capillary: 195 mg/dL — ABNORMAL HIGH (ref 70–99)

## 2019-10-16 LAB — TYPE AND SCREEN
ABO/RH(D): O POS
Antibody Screen: NEGATIVE

## 2019-10-16 LAB — HEMOGLOBIN A1C
Hgb A1c MFr Bld: 7.9 % — ABNORMAL HIGH (ref 4.8–5.6)
Mean Plasma Glucose: 180.03 mg/dL

## 2019-10-16 LAB — D-DIMER, QUANTITATIVE: D-Dimer, Quant: 7.31 ug/mL-FEU — ABNORMAL HIGH (ref 0.00–0.50)

## 2019-10-16 LAB — FERRITIN: Ferritin: 1268 ng/mL — ABNORMAL HIGH (ref 24–336)

## 2019-10-16 LAB — ABO/RH: ABO/RH(D): O POS

## 2019-10-16 LAB — MAGNESIUM: Magnesium: 1.8 mg/dL (ref 1.7–2.4)

## 2019-10-16 LAB — C-REACTIVE PROTEIN: CRP: 11.9 mg/dL — ABNORMAL HIGH (ref <1.0)

## 2019-10-16 MED ORDER — ENOXAPARIN SODIUM 40 MG/0.4ML ~~LOC~~ SOLN
40.0000 mg | Freq: Two times a day (BID) | SUBCUTANEOUS | Status: DC
  Administered 2019-10-16 – 2019-10-18 (×4): 40 mg via SUBCUTANEOUS
  Filled 2019-10-16 (×4): qty 0.4

## 2019-10-16 MED ORDER — DEXAMETHASONE SODIUM PHOSPHATE 10 MG/ML IJ SOLN
6.0000 mg | Freq: Two times a day (BID) | INTRAMUSCULAR | Status: DC
  Administered 2019-10-16 – 2019-10-20 (×10): 6 mg via INTRAVENOUS
  Filled 2019-10-16 (×10): qty 1

## 2019-10-16 MED ORDER — TOCILIZUMAB 400 MG/20ML IV SOLN
8.0000 mg/kg | Freq: Once | INTRAVENOUS | Status: AC
  Administered 2019-10-16: 566 mg via INTRAVENOUS
  Filled 2019-10-16: qty 20

## 2019-10-16 MED ORDER — INSULIN ASPART 100 UNIT/ML ~~LOC~~ SOLN
0.0000 [IU] | Freq: Three times a day (TID) | SUBCUTANEOUS | Status: DC
  Administered 2019-10-16: 12:00:00 8 [IU] via SUBCUTANEOUS
  Administered 2019-10-16: 16:00:00 5 [IU] via SUBCUTANEOUS
  Administered 2019-10-17: 8 [IU] via SUBCUTANEOUS
  Administered 2019-10-17: 3 [IU] via SUBCUTANEOUS
  Administered 2019-10-17: 5 [IU] via SUBCUTANEOUS
  Administered 2019-10-18: 2 [IU] via SUBCUTANEOUS
  Administered 2019-10-18: 3 [IU] via SUBCUTANEOUS
  Administered 2019-10-18: 8 [IU] via SUBCUTANEOUS
  Administered 2019-10-19: 5 [IU] via SUBCUTANEOUS
  Administered 2019-10-19 – 2019-10-20 (×3): 3 [IU] via SUBCUTANEOUS
  Administered 2019-10-20: 16:00:00 8 [IU] via SUBCUTANEOUS
  Administered 2019-10-20: 5 [IU] via SUBCUTANEOUS
  Administered 2019-10-21: 8 [IU] via SUBCUTANEOUS
  Administered 2019-10-21: 2 [IU] via SUBCUTANEOUS
  Administered 2019-10-21: 3 [IU] via SUBCUTANEOUS
  Administered 2019-10-22: 5 [IU] via SUBCUTANEOUS
  Administered 2019-10-22 – 2019-10-23 (×3): 3 [IU] via SUBCUTANEOUS

## 2019-10-16 MED ORDER — INSULIN DETEMIR 100 UNIT/ML ~~LOC~~ SOLN
5.0000 [IU] | Freq: Every day | SUBCUTANEOUS | Status: DC
  Administered 2019-10-16 – 2019-10-22 (×7): 5 [IU] via SUBCUTANEOUS
  Filled 2019-10-16 (×9): qty 0.05

## 2019-10-16 MED ORDER — INSULIN ASPART 100 UNIT/ML ~~LOC~~ SOLN
0.0000 [IU] | Freq: Every day | SUBCUTANEOUS | Status: DC
  Administered 2019-10-17 – 2019-10-18 (×2): 2 [IU] via SUBCUTANEOUS
  Administered 2019-10-20: 3 [IU] via SUBCUTANEOUS
  Administered 2019-10-21 – 2019-10-22 (×2): 2 [IU] via SUBCUTANEOUS

## 2019-10-16 MED ORDER — INSULIN ASPART 100 UNIT/ML ~~LOC~~ SOLN
3.0000 [IU] | Freq: Three times a day (TID) | SUBCUTANEOUS | Status: DC
  Administered 2019-10-16 – 2019-10-18 (×7): 3 [IU] via SUBCUTANEOUS

## 2019-10-16 MED ORDER — METOPROLOL TARTRATE 25 MG PO TABS
25.0000 mg | ORAL_TABLET | Freq: Two times a day (BID) | ORAL | Status: DC
  Administered 2019-10-16 – 2019-10-23 (×15): 25 mg via ORAL
  Filled 2019-10-16 (×15): qty 1

## 2019-10-16 MED ORDER — SODIUM CHLORIDE 0.9% IV SOLUTION: Freq: Once | INTRAVENOUS | Status: AC

## 2019-10-16 NOTE — Progress Notes (Signed)
   Vital Signs MEWS/VS Documentation      10/15/2019 1935 10/16/2019 0020 10/16/2019 0409 10/16/2019 0421   MEWS Score:  0  0  5  4   MEWS Score Color:  Green  Green  Red  Red   Resp:  19  20  (!) 31  --   Pulse:  72  80  (!) 130  (!) 115   BP:  139/69  (!) 152/83  126/83  --   Temp:  98.3 F (36.8 C)  98.6 F (37 C)  100.1 F (37.8 C)  --   O2 Device:  Non-rebreather Mask;HFNC  Non-rebreather Mask;HFNC  HFNC  --   O2 Flow Rate (L/min):  15 L/min  15 L/min  --  --   Level of Consciousness:  Alert  Alert  --  --     Notified R. Shanon Brow, MD regarding changes; Charge RN Danae Chen notified; following MEWS protocol. Will continue to monitor.      Walter Phillips 10/16/2019,4:28 AM

## 2019-10-16 NOTE — Progress Notes (Signed)
[facility] PROGRESS NOTE    Progress Note  Walter Phillips  [id number] DOB: 1961-01-23 DOA: 10/15/2019 PCP: Patient, No Pcp Per     Brief Narrative:   Walter Phillips is an 58 y.o. male past medical history significant for diabetes mellitus type 2, hyperlipidemia diverticulosis status post sigmoid colostomy that comes in complaining of difficulty breathing.  He relates has been having fever sore throat headache diarrhea nausea cough and chest discomfort.  On admission to the emergency room he was found to be satting 88%, breathing 24-30 times per minute placed on a nonrebreather.  Was found to have a leukocytosis and elevated lactate with a chest x-ray showing bilateral atypical pneumonia flu swab was negative, SARS-CoV-2 PCR was positive.  Assessment/Plan:   Sepsis/acute respiratory failure with hypoxia secondary to pneumonia due to COVID-19: Patient is requiring 15 L of oxygen to keep saturations greater than 96%. General IV remdesivir, Decadron vitamin C and zinc. Inflammatory markers are elevated. He relates his breathing is unchanged compared to yesterday. I talked to him about convalescent plasma and Actemra and he has agreed to receive both Try to give the patient peripherally 16 hours a day, if not prone out of bed to chair. The treatment plan and use of medications (convalescent plasma and Actemra) and known side effects were discussed with patient/family, they were clearly explained that there is no proven definitive treatment for COVID-19 infection, any medications used here are based on published clinical articles/anecdotal data which are not peer-reviewed or randomized control trials.  Complete risks and long-term side effects are unknown, however in the best clinical judgment they seem to be of some clinical benefit rather than medical risks.  Patient/family agree with the treatment plan and want to receive the given medications.  Uncontrolled type 2 diabetes mellitus  with hyperglycemia ([place]) Hold oral hypoglycemic agents.  With an A1c of 7.9 His blood sugar is bound to become erratic, will add long-acting insulin, change sliding scale to moderate.  DVT prophylaxis: lovenox Family Communication:none Disposition Plan/Barrier to D/C: Unable to determine. Code Status:     Code Status Orders  (From admission, onward)         Start     Ordered   10/15/19 0732  Full code  Continuous     10/15/19 0737        Code Status History    This patient has a current code status but no historical code status.   Advance Care Planning Activity        IV Access:    Peripheral IV   Procedures and diagnostic studies:   DG Chest Port 1 View  Result Date: 10/15/2019 CLINICAL DATA:  Shortness of breath.  COVID-19 positive. EXAM: PORTABLE CHEST 1 VIEW COMPARISON:  Radiograph 03/14/2007 FINDINGS: Low lung volumes. Prominent heterogeneous opacities throughout both lungs, basilar predominant. Mild background fine interstitial opacities. Heart is normal in size. Normal mediastinal contours. No pleural fluid or pneumothorax. Overlying artifact projects over the chest. IMPRESSION: Low lung volumes with prominent heterogeneous opacities throughout both lungs, consistent with pneumonia, pattern typical of COVID-19 . Background fine interstitial opacities which may represent emphysema. Electronically Signed   By: Keith Rake M.D.   On: 10/15/2019 05:21     Medical Consultants:    None.  Anti-Infectives:   IV remdesivir  Subjective:    Walter Phillips he relates he continues to be short of breath even with going up to the chair, he relates his shortness of breath is the same or worse  than yesterday.  Objective:    Vitals:   10/16/19 0450 10/16/19 0519 10/16/19 0600 10/16/19 0700  BP: 121/63 122/73 122/78 123/77  Pulse: (!) 126 (!) 122 (!) 105 (!) 102  Resp: (!) 22 (!) 23 (!) 24 (!) 25  Temp: 99 F (37.2 C) 99.1 F (37.3 C) 99 F (37.2 C)     TempSrc: Oral Oral Oral   SpO2: 91% 93% 95% 96%  Weight:      Height:       SpO2: 96 % O2 Flow Rate (L/min): 15 L/min   Intake/Output Summary (Last 24 hours) at 10/16/2019 0721 Last data filed at 10/15/2019 2113 Gross per 24 hour  Intake 490 ml  Output 1450 ml  Net -960 ml   Filed Weights   10/15/19 0926 10/15/19 1445  Weight: 75.8 kg 70.7 kg    Exam: General exam: In no acute distress. Respiratory system: Good air movement and crackles bilaterally Cardiovascular system: S1 & S2 heard, RRR. No JVD. Gastrointestinal system: Abdomen is nondistended, soft and nontender.  Central nervous system: Alert and oriented. No focal neurological deficits. Extremities: No pedal edema. Skin: No rashes, lesions or ulcers Psychiatry: Judgement and insight appear normal. Mood & affect appropriate.    Data Reviewed:    Labs: Basic Metabolic Panel: Recent Labs  Lab 10/15/19 0458 10/15/19 0832  NA 136 137  K 3.9 3.8  CL 104  --   CO2 19*  --   GLUCOSE 141*  --   BUN 15  --   CREATININE 0.52*  --   CALCIUM 8.7*  --    GFR Estimated Creatinine Clearance: 85.4 mL/min (A) (by C-G formula based on SCr of 0.52 mg/dL (L)). Liver Function Tests: Recent Labs  Lab 10/15/19 0458  AST 44*  ALT 56*  ALKPHOS 70  BILITOT 1.2  PROT 6.9  ALBUMIN 2.9*   No results for input(s): LIPASE, AMYLASE in the last 168 hours. No results for input(s): AMMONIA in the last 168 hours. Coagulation profile No results for input(s): INR, PROTIME in the last 168 hours. COVID-19 Labs  Recent Labs    10/15/19 0458  DDIMER 2.66*  FERRITIN 1,382*  LDH 590*  CRP 3.8*    Lab Results  Component Value Date   SARSCOV2NAA POSITIVE (A) 10/15/2019    CBC: Recent Labs  Lab 10/15/19 0458 10/15/19 0832 10/16/19 0440  WBC 11.3*  --  13.3*  NEUTROABS 10.0*  --  12.1*  HGB 14.6 13.3 15.3  HCT 42.7 39.0 44.6  MCV 89.1  --  89.7  PLT 386  --  405*   Cardiac Enzymes: No results for input(s):  CKTOTAL, CKMB, CKMBINDEX, TROPONINI in the last 168 hours. BNP (last 3 results) No results for input(s): PROBNP in the last 8760 hours. CBG: Recent Labs  Lab 10/15/19 0914 10/15/19 1247 10/15/19 1619 10/15/19 1936 10/16/19 0457  GLUCAP 129* 190* 231* 243* 113*   D-Dimer: Recent Labs    10/15/19 0458  DDIMER 2.66*   Hgb A1c: No results for input(s): HGBA1C in the last 72 hours. Lipid Profile: Recent Labs    10/15/19 0458  TRIG 274*   Thyroid function studies: No results for input(s): TSH, T4TOTAL, T3FREE, THYROIDAB in the last 72 hours.  Invalid input(s): FREET3 Anemia work up: Recent Labs    10/15/19 0458  FERRITIN 1,382*   Sepsis Labs: Recent Labs  Lab 10/15/19 0458 10/15/19 0804 10/16/19 0440  PROCALCITON <0.10 <0.10  --   WBC 11.3*  --  13.3*  LATICACIDVEN 2.3* 1.8  --    Microbiology Recent Results (from the past 240 hour(s))  Blood Culture (routine x 2)     Status: None (Preliminary result)   Collection Time: 10/15/19  4:58 AM   Specimen: BLOOD RIGHT FOREARM  Result Value Ref Range Status   Specimen Description BLOOD RIGHT FOREARM  Final   Special Requests   Final    BOTTLES DRAWN AEROBIC AND ANAEROBIC Blood Culture results may not be optimal due to an inadequate volume of blood received in culture bottles Performed at [facility] [address] [place], [place] [address]    Culture PENDING  Incomplete   Report Status PENDING  Incomplete  Respiratory Panel by RT PCR (Flu A&B, Covid) - Nasopharyngeal Swab     Status: Abnormal   Collection Time: 10/15/19  5:26 AM   Specimen: Nasopharyngeal Swab  Result Value Ref Range Status   SARS Coronavirus 2 by RT PCR POSITIVE (A) NEGATIVE Final    Comment: RESULT CALLED TO, READ BACK BY AND VERIFIED WITH: RN MORRIS, TAY[place] [id number] FCP (NOTE) SARS-CoV-2 target nucleic acids are DETECTED. SARS-CoV-2 RNA is generally detectable in upper respiratory specimens  during the acute phase of infection.  Positive results are indicative of the presence of the identified virus, but do not rule out bacterial infection or co-infection with other pathogens not detected by the test. Clinical correlation with patient history and other diagnostic information is necessary to determine patient infection status. The expected result is Negative. Fact Sheet for Patients:  PinkCheek.be Fact Sheet for Healthcare Providers: GravelBags.it This test is not yet approved or cleared by the [place] FDA and  has been authorized for detection and/or diagnosis of SARS-CoV-2 by FDA under an Emergency Use Authorization (EUA).  This EUA will remain in effect (meaning this test can be used) for  the duration of  the COVID-19 declaration under Section 564(b)(1) of the Act, 21 U.S.C. section 360bbb-3(b)(1), unless the authorization is terminated or revoked sooner.    Influenza A by PCR NEGATIVE NEGATIVE Final   Influenza B by PCR NEGATIVE NEGATIVE Final    Comment: (NOTE) The Xpert Xpress SARS-CoV-2/FLU/RSV assay is intended as an aid in  the diagnosis of influenza from Nasopharyngeal swab specimens and  should not be used as a sole basis for treatment. Nasal washings and  aspirates are unacceptable for Xpert Xpress SARS-CoV-2/FLU/RSV  testing. Fact Sheet for Patients: PinkCheek.be Fact Sheet for Healthcare Providers: GravelBags.it This test is not yet approved or cleared by the [place] FDA and  has been authorized for detection and/or diagnosis of SARS-CoV-2 by  FDA under an Emergency Use Authorization (EUA). This EUA will remain  in effect (meaning this test can be used) for the duration of the  Covid-19 declaration under Section 564(b)(1) of the Act, 21  U.S.C. section 360bbb-3(b)(1), unless the authorization is  terminated or revoked. Performed at [facility], [place]  [address] [place], [place] [address]   Culture, sputum-assessment     Status: None   Collection Time: 10/15/19  3:18 PM   Specimen: Expectorated Sputum  Result Value Ref Range Status   Specimen Description EXPECTORATED SPUTUM  Final   Special Requests NONE  Final   Sputum evaluation   Final    Sputum specimen not acceptable for testing.  Please recollect.   Performed at [facility], [place] [address] [place],  [address]    Report Status 10/15/2019 FINAL  Final  Medications:   . albuterol  2 puff Inhalation Q6H  . vitamin C  500 mg Oral Daily  . dexamethasone (DECADRON) injection  6 mg Intravenous Q24H  . enoxaparin (LOVENOX) injection  40 mg Subcutaneous Q24H  . famotidine  20 mg Oral BID  . insulin aspart  0-9 Units Subcutaneous Q4H  . zinc sulfate  220 mg Oral Daily   Continuous Infusions: . remdesivir 100 mg in NS 100 mL        LOS: 1 day   Charlynne Cousins  [facility]  10/16/2019, 7:21 AM

## 2019-10-17 LAB — COMPREHENSIVE METABOLIC PANEL
ALT: 37 U/L (ref 0–44)
AST: 25 U/L (ref 15–41)
Albumin: 3.1 g/dL — ABNORMAL LOW (ref 3.5–5.0)
Alkaline Phosphatase: 81 U/L (ref 38–126)
Anion gap: 9 (ref 5–15)
BUN: 20 mg/dL (ref 6–20)
CO2: 25 mmol/L (ref 22–32)
Calcium: 8.8 mg/dL — ABNORMAL LOW (ref 8.9–10.3)
Chloride: 102 mmol/L (ref 98–111)
Creatinine, Ser: 0.4 mg/dL — ABNORMAL LOW (ref 0.61–1.24)
GFR calc Af Amer: >60 mL/min (ref >60)
GFR calc non Af Amer: >60 mL/min (ref >60)
Glucose, Bld: 158 mg/dL — ABNORMAL HIGH (ref 70–99)
Potassium: 4.1 mmol/L (ref 3.5–5.1)
Sodium: 136 mmol/L (ref 135–145)
Total Bilirubin: 1.1 mg/dL (ref 0.3–1.2)
Total Protein: 7.1 g/dL (ref 6.5–8.1)

## 2019-10-17 LAB — CBC WITH DIFFERENTIAL/PLATELET
Abs Immature Granulocytes: 0.13 10*3/uL — ABNORMAL HIGH (ref 0.00–0.07)
Basophils Absolute: 0 10*3/uL (ref 0.0–0.1)
Basophils Relative: 0 %
Eosinophils Absolute: 0 10*3/uL (ref 0.0–0.5)
Eosinophils Relative: 0 %
HCT: 40.6 % (ref 39.0–52.0)
Hemoglobin: 14 g/dL (ref 13.0–17.0)
Immature Granulocytes: 1 %
Lymphocytes Relative: 5 %
Lymphs Abs: 0.4 10*3/uL — ABNORMAL LOW (ref 0.7–4.0)
MCH: 31 pg (ref 26.0–34.0)
MCHC: 34.5 g/dL (ref 30.0–36.0)
MCV: 90 fL (ref 80.0–100.0)
Monocytes Absolute: 0.1 10*3/uL (ref 0.1–1.0)
Monocytes Relative: 1 %
Neutro Abs: 8.7 10*3/uL — ABNORMAL HIGH (ref 1.7–7.7)
Neutrophils Relative %: 93 %
Platelets: 382 10*3/uL (ref 150–400)
RBC: 4.51 MIL/uL (ref 4.22–5.81)
RDW: 12.8 % (ref 11.5–15.5)
WBC: 9.3 10*3/uL (ref 4.0–10.5)
nRBC: 0 % (ref 0.0–0.2)

## 2019-10-17 LAB — PREPARE FRESH FROZEN PLASMA: Unit division: 0

## 2019-10-17 LAB — GLUCOSE, CAPILLARY
Glucose-Capillary: 156 mg/dL — ABNORMAL HIGH (ref 70–99)
Glucose-Capillary: 236 mg/dL — ABNORMAL HIGH (ref 70–99)
Glucose-Capillary: 266 mg/dL — ABNORMAL HIGH (ref 70–99)
Glucose-Capillary: 239 mg/dL — ABNORMAL HIGH (ref 70–99)

## 2019-10-17 LAB — BPAM FFP
Blood Product Expiration Date: 202012180032
ISSUE DATE / TIME: 202012171313
Unit Type and Rh: 5100

## 2019-10-17 LAB — FERRITIN: Ferritin: 1261 ng/mL — ABNORMAL HIGH (ref 24–336)

## 2019-10-17 LAB — MAGNESIUM: Magnesium: 2.1 mg/dL (ref 1.7–2.4)

## 2019-10-17 LAB — C-REACTIVE PROTEIN: CRP: 16.6 mg/dL — ABNORMAL HIGH (ref <1.0)

## 2019-10-17 LAB — D-DIMER, QUANTITATIVE: D-Dimer, Quant: 5.91 ug/mL-FEU — ABNORMAL HIGH (ref 0.00–0.50)

## 2019-10-17 NOTE — TOC Initial Note (Signed)
Transition of Care [facility]) - Initial/Assessment Note    Patient Details  Name: Walter Phillips MRN: [id number] Date of Birth: Jan 07, 1961  Transition of Care [facility]) CM/SW Contact:    Ninfa Meeker, RN Phone Number: 10/17/2019, 2:16 PM  Clinical Narrative:  Patient is 58 yr old gentleman went to [facility] with shortness of breath, ill feeling. Patient admitted and transferred to [facility] for further  sepsis and acute respiratory failure, pneumonia due to COVID 19. Patient is on 10LHFNC, Remdesivir until 12/21. Case manager has scheduled telephonic hospital follow up with [facility] for [place] at 3:30pm. Appointment has been placed on AVS. Case manager will continue to monitor for needs.                     Expected Discharge Plan: Home/Self Care Barriers to Discharge: Continued Medical Work up   Patient Goals and CMS Choice       Expected Discharge Plan and Services Expected Discharge Plan: Home/Self Care   Discharge Planning Services: CM Consult                                          Prior Living Arrangements/Services                       Activities of Daily Living Home Assistive Devices/Equipment: None ADL Screening (condition at time of admission) Patient's cognitive ability adequate to safely complete daily activities?: Yes Is the patient deaf or have difficulty hearing?: No Does the patient have difficulty seeing, even when wearing glasses/contacts?: No Does the patient have difficulty concentrating, remembering, or making decisions?: No Patient able to express need for assistance with ADLs?: Yes Does the patient have difficulty dressing or bathing?: No Independently performs ADLs?: Yes (appropriate for developmental age) Does the patient have difficulty walking or climbing stairs?: No Weakness of Legs: None Weakness of Arms/Hands: None  Permission Sought/Granted                  Emotional Assessment               Admission diagnosis:  Hypoxia [R09.02] Acute respiratory failure with hypoxia ([place]) [J96.01] Pneumonia due to COVID-19 virus [U07.1, J12.89] Patient Active Problem List   Diagnosis Date Noted  . Uncontrolled type 2 diabetes mellitus with hyperglycemia ([place]) 10/16/2019  . Pneumonia due to COVID-19 virus 10/15/2019  . Sepsis ([place]) 10/15/2019  . Acute respiratory failure with hypoxia ([place]) 10/15/2019  . Dyslipidemia 10/15/2019   PCP:  Patient, No Pcp Per Pharmacy:   [place], [place] [address] Phone: [phone] Fax: [phone]     Social Determinants of Health (SDOH) Interventions    Readmission Risk Interventions No flowsheet data found.

## 2019-10-17 NOTE — Progress Notes (Signed)
[facility] PROGRESS NOTE    Progress Note  Walter Phillips  [id number] DOB: 1961-04-10 DOA: 10/15/2019 PCP: Patient, No Pcp Per     Brief Narrative:   Walter Phillips is an 58 y.o. male past medical history significant for diabetes mellitus type 2, hyperlipidemia diverticulosis status post sigmoid colostomy that comes in complaining of difficulty breathing.  He relates has been having fever sore throat headache diarrhea nausea cough and chest discomfort.  On admission to the emergency room he was found to be satting 88%, breathing 24-30 times per minute placed on a nonrebreather.  Was found to have a leukocytosis and elevated lactate with a chest x-ray showing bilateral atypical pneumonia flu swab was negative, SARS-CoV-2 PCR was positive.  Assessment/Plan:   Sepsis/acute respiratory failure with hypoxia secondary to pneumonia due to COVID-19: Walter Phillips is currently requiring 10 L of high flow nasal cannula to keep saturations greater than 95%. Order on IV remdesivir, Decadron vitamin C and zinc. He also received Actemra and convalescent plasma on 10/16/2019. His inflammatory markers were elevated and trending up.  Uncontrolled type 2 diabetes mellitus with hyperglycemia (HCC) A1c of 7.9, currently on long-acting insulin plus sliding scale.  DVT prophylaxis: lovenox Family Communication:none Disposition Plan/Barrier to D/C: Unable to determine. Code Status:     Code Status Orders  (From admission, onward)         Start     Ordered   10/15/19 0732  Full code  Continuous     10/15/19 0737        Code Status History    This patient has a current code status but no historical code status.   Advance Care Planning Activity        IV Access:    Peripheral IV   Procedures and diagnostic studies:   No results found.   Medical Consultants:    None.  Anti-Infectives:   IV remdesivir  Subjective:    Walter Phillips relates his breathing is better than  yesterday.  Objective:    Vitals:   10/16/19 1929 10/17/19 0000 10/17/19 0349 10/17/19 0412  BP: 90/67 128/77 121/73   Pulse: 79 66 64   Resp: 20 (!) 22 (!) 31 (!) 22  Temp: 98.1 F (36.7 C) 98.2 F (36.8 C) 97.6 F (36.4 C)   TempSrc: Oral Oral Oral   SpO2: 94% 95% 92%   Weight:      Height:       SpO2: 92 % O2 Flow Rate (L/min): 10 L/min   Intake/Output Summary (Last 24 hours) at 10/17/2019 0727 Last data filed at 10/17/2019 0300 Gross per 24 hour  Intake 848 ml  Output 350 ml  Net 498 ml   Filed Weights   10/15/19 0926 10/15/19 1445  Weight: 75.8 kg 70.7 kg    Exam: General exam: In no acute distress. Respiratory system: Good air movement and diffuse crackles bilaterally. Cardiovascular system: S1 & S2 heard, RRR. No JVD. Gastrointestinal system: Abdomen is nondistended, soft and nontender.  Central nervous system: Alert and oriented. No focal neurological deficits. Extremities: No pedal edema. Skin: No rashes, lesions or ulcers Psychiatry: Judgement and insight appear normal. Mood & affect appropriate.    Data Reviewed:    Labs: Basic Metabolic Panel: Recent Labs  Lab 10/15/19 0458 10/15/19 0832 10/16/19 0440 10/17/19 0315  NA 136 137 137 136  K 3.9 3.8 3.6 4.1  CL 104  --  101 102  CO2 19*  --  21* 25  GLUCOSE 141*  --  97 158*  BUN 15  --  17 20  CREATININE 0.52*  --  0.57* 0.40*  CALCIUM 8.7*  --  8.6* 8.8*  MG  --   --  1.8 2.1   GFR Estimated Creatinine Clearance: 85.4 mL/min (A) (by C-G formula based on SCr of 0.4 mg/dL (L)). Liver Function Tests: Recent Labs  Lab 10/15/19 0458 10/16/19 0440 10/17/19 0315  AST 44* 39 25  ALT 56* 49* 37  ALKPHOS 70 75 81  BILITOT 1.2 1.3* 1.1  PROT 6.9 7.3 7.1  ALBUMIN 2.9* 3.0* 3.1*   No results for input(s): LIPASE, AMYLASE in the last 168 hours. No results for input(s): AMMONIA in the last 168 hours. Coagulation profile No results for input(s): INR, PROTIME in the last 168 hours.  COVID-19 Labs  Recent Labs    10/15/19 0458 10/16/19 0440 10/17/19 0315  DDIMER 2.66* 7.31* 5.91*  FERRITIN 1,382* 1,268*  --   LDH 590*  --   --   CRP 3.8* 11.9*  --     Lab Results  Component Value Date   SARSCOV2NAA POSITIVE (A) 10/15/2019    CBC: Recent Labs  Lab 10/15/19 0458 10/15/19 0832 10/16/19 0440 10/17/19 0315  WBC 11.3*  --  13.3* 9.3  NEUTROABS 10.0*  --  12.1* 8.7*  HGB 14.6 13.3 15.3 14.0  HCT 42.7 39.0 44.6 40.6  MCV 89.1  --  89.7 90.0  PLT 386  --  405* 382   Cardiac Enzymes: No results for input(s): CKTOTAL, CKMB, CKMBINDEX, TROPONINI in the last 168 hours. BNP (last 3 results) No results for input(s): PROBNP in the last 8760 hours. CBG: Recent Labs  Lab 10/16/19 0457 10/16/19 0731 10/16/19 1153 10/16/19 1603 10/16/19 2024  GLUCAP 113* 120* 260* 220* 195*   D-Dimer: Recent Labs    10/16/19 0440 10/17/19 0315  DDIMER 7.31* 5.91*   Hgb A1c: Recent Labs    10/16/19 0440  HGBA1C 7.9*   Lipid Profile: Recent Labs    10/15/19 0458  TRIG 274*   Thyroid function studies: No results for input(s): TSH, T4TOTAL, T3FREE, THYROIDAB in the last 72 hours.  Invalid input(s): FREET3 Anemia work up: Recent Labs    10/15/19 0458 10/16/19 0440  FERRITIN 1,382* 1,268*   Sepsis Labs: Recent Labs  Lab 10/15/19 0458 10/15/19 0804 10/16/19 0440 10/17/19 0315  PROCALCITON <0.10 <0.10  --   --   WBC 11.3*  --  13.3* 9.3  LATICACIDVEN 2.3* 1.8  --   --    Microbiology Recent Results (from the past 240 hour(s))  Blood Culture (routine x 2)     Status: None (Preliminary result)   Collection Time: 10/15/19  4:58 AM   Specimen: BLOOD RIGHT FOREARM  Result Value Ref Range Status   Specimen Description BLOOD RIGHT FOREARM  Final   Special Requests   Final    BOTTLES DRAWN AEROBIC AND ANAEROBIC Blood Culture results may not be optimal due to an inadequate volume of blood received in culture bottles   Culture   Final    NO GROWTH 1 DAY  Performed at [facility], [place] [address], [place], [place] [address]    Report Status PENDING  Incomplete  Blood Culture (routine x 2)     Status: None (Preliminary result)   Collection Time: 10/15/19  5:03 AM   Specimen: BLOOD RIGHT FOREARM  Result Value Ref Range Status   Specimen Description BLOOD RIGHT FOREARM  Final   Special Requests  Final    BOTTLES DRAWN AEROBIC AND ANAEROBIC Blood Culture results may not be optimal due to an inadequate volume of blood received in culture bottles   Culture   Final    NO GROWTH 1 DAY Performed at [place] [address] [place], [place] [address]    Report Status PENDING  Incomplete  Respiratory Panel by RT PCR (Flu A&B, Covid) - Nasopharyngeal Swab     Status: Abnormal   Collection Time: 10/15/19  5:26 AM   Specimen: Nasopharyngeal Swab  Result Value Ref Range Status   SARS Coronavirus 2 by RT PCR POSITIVE (A) NEGATIVE Final    Comment: RESULT CALLED TO, READ BACK BY AND VERIFIED WITH: RN MORRIS, TAYLOR [place] [id number] FCP (NOTE) SARS-CoV-2 target nucleic acids are DETECTED. SARS-CoV-2 RNA is generally detectable in upper respiratory specimens  during the acute phase of infection. Positive results are indicative of the presence of the identified virus, but do not rule out bacterial infection or co-infection with other pathogens not detected by the test. Clinical correlation with patient history and other diagnostic information is necessary to determine patient infection status. The expected result is Negative. Fact Sheet for Patients:  PinkCheek.be Fact Sheet for Healthcare Providers: GravelBags.it This test is not yet approved or cleared by the [place] FDA and  has been authorized for detection and/or diagnosis of SARS-CoV-2 by FDA under an Emergency Use Authorization (EUA).  This EUA will remain in effect (meaning this test can be used) for  the duration  of  the COVID-19 declaration under Section 564(b)(1) of the Act, 21 U.S.C. section 360bbb-3(b)(1), unless the authorization is terminated or revoked sooner.    Influenza A by PCR NEGATIVE NEGATIVE Final   Influenza B by PCR NEGATIVE NEGATIVE Final    Comment: (NOTE) The Xpert Xpress SARS-CoV-2/FLU/RSV assay is intended as an aid in  the diagnosis of influenza from Nasopharyngeal swab specimens and  should not be used as a sole basis for treatment. Nasal washings and  aspirates are unacceptable for Xpert Xpress SARS-CoV-2/FLU/RSV  testing. Fact Sheet for Patients: PinkCheek.be Fact Sheet for Healthcare Providers: GravelBags.it This test is not yet approved or cleared by the [place] FDA and  has been authorized for detection and/or diagnosis of SARS-CoV-2 by  FDA under an Emergency Use Authorization (EUA). This EUA will remain  in effect (meaning this test can be used) for the duration of the  Covid-19 declaration under Section 564(b)(1) of the Act, 21  U.S.C. section 360bbb-3(b)(1), unless the authorization is  terminated or revoked. Performed at [facility], [place] [address], [place], [place] [address]   Culture, sputum-assessment     Status: None   Collection Time: 10/15/19  3:18 PM   Specimen: Expectorated Sputum  Result Value Ref Range Status   Specimen Description EXPECTORATED SPUTUM  Final   Special Requests NONE  Final   Sputum evaluation   Final    Sputum specimen not acceptable for testing.  Please recollect.   Performed at [facility], [place] [address], [place], [place] [address]    Report Status 10/15/2019 FINAL  Final     Medications:   . albuterol  2 puff Inhalation Q6H  . vitamin C  500 mg Oral Daily  . dexamethasone (DECADRON) injection  6 mg Intravenous Q12H  . enoxaparin (LOVENOX) injection  40 mg Subcutaneous Q12H  . famotidine  20 mg Oral BID  . insulin aspart  0-15  Units Subcutaneous TID  WC  . insulin aspart  0-5 Units Subcutaneous QHS  . insulin aspart  3 Units Subcutaneous TID WC  . insulin detemir  5 Units Subcutaneous QHS  . metoprolol tartrate  25 mg Oral BID  . zinc sulfate  220 mg Oral Daily   Continuous Infusions: . remdesivir 100 mg in NS 100 mL Stopped (10/16/19 1900)      LOS: 2 days   Charlynne Cousins  [facility]  10/17/2019, 7:27 AM

## 2019-10-17 NOTE — Plan of Care (Signed)
  Problem: Respiratory: Goal: Complications related to the disease process, condition or treatment will be avoided or minimized Outcome: Not Applicable  Remains on O2

## 2019-10-18 LAB — CBC WITH DIFFERENTIAL/PLATELET
Abs Immature Granulocytes: 0.18 10*3/uL — ABNORMAL HIGH (ref 0.00–0.07)
Basophils Absolute: 0 10*3/uL (ref 0.0–0.1)
Basophils Relative: 0 %
Eosinophils Absolute: 0 10*3/uL (ref 0.0–0.5)
Eosinophils Relative: 0 %
HCT: 44.9 % (ref 39.0–52.0)
Hemoglobin: 15.3 g/dL (ref 13.0–17.0)
Immature Granulocytes: 2 %
Lymphocytes Relative: 4 %
Lymphs Abs: 0.4 10*3/uL — ABNORMAL LOW (ref 0.7–4.0)
MCH: 30.8 pg (ref 26.0–34.0)
MCHC: 34.1 g/dL (ref 30.0–36.0)
MCV: 90.3 fL (ref 80.0–100.0)
Monocytes Absolute: 0.2 10*3/uL (ref 0.1–1.0)
Monocytes Relative: 2 %
Neutro Abs: 8.8 10*3/uL — ABNORMAL HIGH (ref 1.7–7.7)
Neutrophils Relative %: 92 %
Platelets: 441 10*3/uL — ABNORMAL HIGH (ref 150–400)
RBC: 4.97 MIL/uL (ref 4.22–5.81)
RDW: 13.1 % (ref 11.5–15.5)
WBC: 9.6 10*3/uL (ref 4.0–10.5)
nRBC: 0 % (ref 0.0–0.2)

## 2019-10-18 LAB — GLUCOSE, CAPILLARY
Glucose-Capillary: 152 mg/dL — ABNORMAL HIGH (ref 70–99)
Glucose-Capillary: 263 mg/dL — ABNORMAL HIGH (ref 70–99)
Glucose-Capillary: 147 mg/dL — ABNORMAL HIGH (ref 70–99)
Glucose-Capillary: 241 mg/dL — ABNORMAL HIGH (ref 70–99)

## 2019-10-18 LAB — COMPREHENSIVE METABOLIC PANEL
ALT: 37 U/L (ref 0–44)
AST: 22 U/L (ref 15–41)
Albumin: 3 g/dL — ABNORMAL LOW (ref 3.5–5.0)
Alkaline Phosphatase: 83 U/L (ref 38–126)
Anion gap: 12 (ref 5–15)
BUN: 23 mg/dL — ABNORMAL HIGH (ref 6–20)
CO2: 23 mmol/L (ref 22–32)
Calcium: 8.9 mg/dL (ref 8.9–10.3)
Chloride: 102 mmol/L (ref 98–111)
Creatinine, Ser: 0.54 mg/dL — ABNORMAL LOW (ref 0.61–1.24)
GFR calc Af Amer: >60 mL/min (ref >60)
GFR calc non Af Amer: >60 mL/min (ref >60)
Glucose, Bld: 132 mg/dL — ABNORMAL HIGH (ref 70–99)
Potassium: 3.9 mmol/L (ref 3.5–5.1)
Sodium: 137 mmol/L (ref 135–145)
Total Bilirubin: 1 mg/dL (ref 0.3–1.2)
Total Protein: 7 g/dL (ref 6.5–8.1)

## 2019-10-18 LAB — C-REACTIVE PROTEIN: CRP: 7.1 mg/dL — ABNORMAL HIGH (ref <1.0)

## 2019-10-18 LAB — MAGNESIUM: Magnesium: 2 mg/dL (ref 1.7–2.4)

## 2019-10-18 LAB — D-DIMER, QUANTITATIVE: D-Dimer, Quant: 2.87 ug/mL-FEU — ABNORMAL HIGH (ref 0.00–0.50)

## 2019-10-18 LAB — FERRITIN: Ferritin: 1087 ng/mL — ABNORMAL HIGH (ref 24–336)

## 2019-10-18 MED ORDER — INSULIN ASPART 100 UNIT/ML ~~LOC~~ SOLN
6.0000 [IU] | Freq: Three times a day (TID) | SUBCUTANEOUS | Status: DC
  Administered 2019-10-18 – 2019-10-23 (×17): 6 [IU] via SUBCUTANEOUS

## 2019-10-18 MED ORDER — ENOXAPARIN SODIUM 40 MG/0.4ML ~~LOC~~ SOLN
40.0000 mg | SUBCUTANEOUS | Status: DC
  Administered 2019-10-19 – 2019-10-23 (×5): 40 mg via SUBCUTANEOUS
  Filled 2019-10-18 (×5): qty 0.4

## 2019-10-18 NOTE — Plan of Care (Signed)
  Problem: Education: Goal: Knowledge of risk factors and measures for prevention of condition will improve Outcome: Progressing   Problem: Coping: Goal: Psychosocial and spiritual needs will be supported Outcome: Progressing   Problem: Respiratory: Goal: Will maintain a patent airway Outcome: Progressing   Problem: Education: Goal: Knowledge of General Education information will improve Description: Including pain rating scale, medication(s)/side effects and non-pharmacologic comfort measures Outcome: Progressing   Problem: Health Behavior/Discharge Planning: Goal: Ability to manage health-related needs will improve Outcome: Progressing   Problem: Clinical Measurements: Goal: Ability to maintain clinical measurements within normal limits will improve Outcome: Progressing Goal: Will remain free from infection Outcome: Progressing Goal: Diagnostic test results will improve Outcome: Progressing Goal: Respiratory complications will improve Outcome: Progressing Goal: Cardiovascular complication will be avoided Outcome: Progressing   Problem: Activity: Goal: Risk for activity intolerance will decrease Outcome: Progressing   Problem: Nutrition: Goal: Adequate nutrition will be maintained Outcome: Progressing   Problem: Coping: Goal: Level of anxiety will decrease Outcome: Progressing   Problem: Elimination: Goal: Will not experience complications related to bowel motility Outcome: Progressing Goal: Will not experience complications related to urinary retention Outcome: Progressing   Problem: Pain Managment: Goal: General experience of comfort will improve Outcome: Progressing   Problem: Safety: Goal: Ability to remain free from injury will improve Outcome: Progressing   Problem: Skin Integrity: Goal: Risk for impaired skin integrity will decrease Outcome: Progressing   

## 2019-10-18 NOTE — Progress Notes (Signed)
[facility] PROGRESS NOTE    Progress Note  [name]  CN:[id number] DOB: 1961/07/04 DOA: 10/15/2019 PCP: Patient, No Pcp Per     Brief Narrative:   Walter Phillips is an 58 y.o. male past medical history significant for diabetes mellitus type 2, hyperlipidemia diverticulosis status post sigmoid colostomy that comes in complaining of difficulty breathing.  He relates has been having fever sore throat headache diarrhea nausea cough and chest discomfort.  On admission to the emergency room he was found to be satting 88%, breathing 24-30 times per minute placed on a nonrebreather.  Was found to have a leukocytosis and elevated lactate with a chest x-ray showing bilateral atypical pneumonia flu swab was negative, SARS-CoV-2 PCR was positive.  Assessment/Plan:   Sepsis/acute respiratory failure with hypoxia secondary to pneumonia due to COVID-19: He is back on 15 L of high flow nasal cannula to keep saturations greater than 88%. New IV remdesivir, steroids vitamin C and vitamin zinc, he did receive Actemra and convalescent plasma on 10/16/2019. His inflammatory markers are improving. His breathing is not as good as yesterday.  He seems a little bit anxious and fatigued today.  Uncontrolled type 2 diabetes mellitus with hyperglycemia (HCC) A1c of 7.9, his blood glucose seems to be fairly controlled continue long-acting insulin plus sliding scale, will increase his meal coverage.  DVT prophylaxis: lovenox Family Communication:none Disposition Plan/Barrier to D/C: Unable to determine. Code Status:     Code Status Orders  (From admission, onward)         Start     Ordered   10/15/19 0732  Full code  Continuous     10/15/19 0737        Code Status History    This patient has a current code status but no historical code status.   Advance Care Planning Activity        IV Access:    Peripheral IV   Procedures and diagnostic studies:   No results  found.   Medical Consultants:    None.  Anti-Infectives:   IV remdesivir  Subjective:    Victoriano Gillooly relates his breathing is unchanged compared to yesterday.  Objective:    Vitals:   10/18/19 0344 10/18/19 0345 10/18/19 0756 10/18/19 0821  BP:   107/68   Pulse: (!) 56  76   Resp:   16   Temp:   97.7 F (36.5 C)   TempSrc:   Oral   SpO2: (!) 88% 90% (!) 81% 92%  Weight:      Height:       SpO2: 92 % O2 Flow Rate (L/min): 15 L/min   Intake/Output Summary (Last 24 hours) at 10/18/2019 0828 Last data filed at 10/18/2019 0400 Gross per 24 hour  Intake --  Output 500 ml  Net -500 ml   Filed Weights   10/15/19 0926 10/15/19 1445  Weight: 75.8 kg 70.7 kg    Exam: General exam: In no acute distress. Respiratory system: Good air movement and diffuse crackles bilaterally Cardiovascular system: S1 & S2 heard, RRR. No JVD. Gastrointestinal system: Abdomen is nondistended, soft and nontender.  Central nervous system: Alert and oriented. No focal neurological deficits. Extremities: No pedal edema. Skin: No rashes, lesions or ulcers Psychiatry: Judgement and insight appear normal. Mood & affect appropriate.    Data Reviewed:    Labs: Basic Metabolic Panel: Recent Labs  Lab 10/15/19 0458 10/15/19 0832 10/16/19 0440 10/17/19 0315 10/18/19 0323  NA 136 137 137 136 137  K  3.9 3.8 3.6 4.1 3.9  CL 104  --  101 102 102  CO2 19*  --  21* 25 23  GLUCOSE 141*  --  97 158* 132*  BUN 15  --  17 20 23*  CREATININE 0.52*  --  0.57* 0.40* 0.54*  CALCIUM 8.7*  --  8.6* 8.8* 8.9  MG  --   --  1.8 2.1 2.0   GFR Estimated Creatinine Clearance: 85.4 mL/min (A) (by C-G formula based on SCr of 0.54 mg/dL (L)). Liver Function Tests: Recent Labs  Lab 10/15/19 0458 10/16/19 0440 10/17/19 0315 10/18/19 0323  AST 44* 39 25 22  ALT 56* 49* 37 37  ALKPHOS 70 75 81 83  BILITOT 1.2 1.3* 1.1 1.0  PROT 6.9 7.3 7.1 7.0  ALBUMIN 2.9* 3.0* 3.1* 3.0*   No results for  input(s): LIPASE, AMYLASE in the last 168 hours. No results for input(s): AMMONIA in the last 168 hours. Coagulation profile No results for input(s): INR, PROTIME in the last 168 hours. COVID-19 Labs  Recent Labs    10/16/19 0440 10/17/19 0315 10/18/19 0323  DDIMER 7.31* 5.91* 2.87*  FERRITIN 1,268* 1,261* 1,087*  CRP 11.9* 16.6* 7.1*    Lab Results  Component Value Date   SARSCOV2NAA POSITIVE (A) 10/15/2019    CBC: Recent Labs  Lab 10/15/19 0458 10/15/19 0832 10/16/19 0440 10/17/19 0315 10/18/19 0323  WBC 11.3*  --  13.3* 9.3 9.6  NEUTROABS 10.0*  --  12.1* 8.7* 8.8*  HGB 14.6 13.3 15.3 14.0 15.3  HCT 42.7 39.0 44.6 40.6 44.9  MCV 89.1  --  89.7 90.0 90.3  PLT 386  --  405* 382 441*   Cardiac Enzymes: No results for input(s): CKTOTAL, CKMB, CKMBINDEX, TROPONINI in the last 168 hours. BNP (last 3 results) No results for input(s): PROBNP in the last 8760 hours. CBG: Recent Labs  Lab 10/17/19 0733 10/17/19 1139 10/17/19 1658 10/17/19 1922 10/18/19 0718  GLUCAP 156* 236* 266* 239* 152*   D-Dimer: Recent Labs    10/17/19 0315 10/18/19 0323  DDIMER 5.91* 2.87*   Hgb A1c: Recent Labs    10/16/19 0440  HGBA1C 7.9*   Lipid Profile: No results for input(s): CHOL, HDL, LDLCALC, TRIG, CHOLHDL, LDLDIRECT in the last 72 hours. Thyroid function studies: No results for input(s): TSH, T4TOTAL, T3FREE, THYROIDAB in the last 72 hours.  Invalid input(s): FREET3 Anemia work up: Recent Labs    10/17/19 0315 10/18/19 0323  FERRITIN 1,261* 1,087*   Sepsis Labs: Recent Labs  Lab 10/15/19 0458 10/15/19 0804 10/16/19 0440 10/17/19 0315 10/18/19 0323  PROCALCITON <0.10 <0.10  --   --   --   WBC 11.3*  --  13.3* 9.3 9.6  LATICACIDVEN 2.3* 1.8  --   --   --    Microbiology Recent Results (from the past 240 hour(s))  Blood Culture (routine x 2)     Status: None (Preliminary result)   Collection Time: 10/15/19  4:58 AM   Specimen: BLOOD RIGHT FOREARM   Result Value Ref Range Status   Specimen Description BLOOD RIGHT FOREARM  Final   Special Requests   Final    BOTTLES DRAWN AEROBIC AND ANAEROBIC Blood Culture results may not be optimal due to an inadequate volume of blood received in culture bottles   Culture   Final    NO GROWTH 2 DAYS Performed at [facility], [place] [address], [place], [place] [address]    Report Status PENDING  Incomplete  Blood Culture (routine x 2)     Status: None (Preliminary result)   Collection Time: 10/15/19  5:03 AM   Specimen: BLOOD RIGHT FOREARM  Result Value Ref Range Status   Specimen Description BLOOD RIGHT FOREARM  Final   Special Requests   Final    BOTTLES DRAWN AEROBIC AND ANAEROBIC Blood Culture results may not be optimal due to an inadequate volume of blood received in culture bottles   Culture   Final    NO GROWTH 2 DAYS Performed at [facility], [place] [address], [place], [place] [address]    Report Status PENDING  Incomplete  Respiratory Panel by RT PCR (Flu A&B, Covid) - Nasopharyngeal Swab     Status: Abnormal   Collection Time: 10/15/19  5:26 AM   Specimen: Nasopharyngeal Swab  Result Value Ref Range Status   SARS Coronavirus 2 by RT PCR POSITIVE (A) NEGATIVE Final    Comment: RESULT CALLED TO, READ BACK BY AND VERIFIED WITH: RN MORRIS, TAYLOR [place] [id number] FCP (NOTE) SARS-CoV-2 target nucleic acids are DETECTED. SARS-CoV-2 RNA is generally detectable in upper respiratory specimens  during the acute phase of infection. Positive results are indicative of the presence of the identified virus, but do not rule out bacterial infection or co-infection with other pathogens not detected by the test. Clinical correlation with patient history and other diagnostic information is necessary to determine patient infection status. The expected result is Negative. Fact Sheet for Patients:  PinkCheek.be Fact Sheet for Healthcare  Providers: GravelBags.it This test is not yet approved or cleared by the [place] FDA and  has been authorized for detection and/or diagnosis of SARS-CoV-2 by FDA under an Emergency Use Authorization (EUA).  This EUA will remain in effect (meaning this test can be used) for  the duration of  the COVID-19 declaration under Section 564(b)(1) of the Act, 21 U.S.C. section 360bbb-3(b)(1), unless the authorization is terminated or revoked sooner.    Influenza A by PCR NEGATIVE NEGATIVE Final   Influenza B by PCR NEGATIVE NEGATIVE Final    Comment: (NOTE) The Xpert Xpress SARS-CoV-2/FLU/RSV assay is intended as an aid in  the diagnosis of influenza from Nasopharyngeal swab specimens and  should not be used as a sole basis for treatment. Nasal washings and  aspirates are unacceptable for Xpert Xpress SARS-CoV-2/FLU/RSV  testing. Fact Sheet for Patients: PinkCheek.be Fact Sheet for Healthcare Providers: GravelBags.it This test is not yet approved or cleared by the [place] FDA and  has been authorized for detection and/or diagnosis of SARS-CoV-2 by  FDA under an Emergency Use Authorization (EUA). This EUA will remain  in effect (meaning this test can be used) for the duration of the  Covid-19 declaration under Section 564(b)(1) of the Act, 21  U.S.C. section 360bbb-3(b)(1), unless the authorization is  terminated or revoked. Performed at [facility], [place] [address], [place], [place] [address]   Culture, sputum-assessment     Status: None   Collection Time: 10/15/19  3:18 PM   Specimen: Expectorated Sputum  Result Value Ref Range Status   Specimen Description EXPECTORATED SPUTUM  Final   Special Requests NONE  Final   Sputum evaluation   Final    Sputum specimen not acceptable for testing.  Please recollect.   Performed at [facility], [place] [address],  [place], [place] [address]    Report Status 10/15/2019 FINAL  Final     Medications:   . albuterol  2 puff Inhalation Q6H  . vitamin C  500 mg Oral Daily  . dexamethasone (DECADRON) injection  6 mg Intravenous Q12H  . enoxaparin (LOVENOX) injection  40 mg Subcutaneous Q12H  . famotidine  20 mg Oral BID  . insulin aspart  0-15 Units Subcutaneous TID WC  . insulin aspart  0-5 Units Subcutaneous QHS  . insulin aspart  3 Units Subcutaneous TID WC  . insulin detemir  5 Units Subcutaneous QHS  . metoprolol tartrate  25 mg Oral BID  . zinc sulfate  220 mg Oral Daily   Continuous Infusions: . remdesivir 100 mg in NS 100 mL 100 mg (10/17/19 1002)      LOS: 3 days   Charlynne Cousins  [facility]  10/18/2019, 8:28 AM

## 2019-10-18 NOTE — Progress Notes (Signed)
Patient called wife and updated her on his status. Wife is spanish speaking only. Phone number is [phone]

## 2019-10-19 LAB — CBC WITH DIFFERENTIAL/PLATELET
Abs Immature Granulocytes: 0.36 10*3/uL — ABNORMAL HIGH (ref 0.00–0.07)
Basophils Absolute: 0 10*3/uL (ref 0.0–0.1)
Basophils Relative: 0 %
Eosinophils Absolute: 0 10*3/uL (ref 0.0–0.5)
Eosinophils Relative: 0 %
HCT: 45 % (ref 39.0–52.0)
Hemoglobin: 15.4 g/dL (ref 13.0–17.0)
Immature Granulocytes: 3 %
Lymphocytes Relative: 4 %
Lymphs Abs: 0.4 10*3/uL — ABNORMAL LOW (ref 0.7–4.0)
MCH: 31 pg (ref 26.0–34.0)
MCHC: 34.2 g/dL (ref 30.0–36.0)
MCV: 90.5 fL (ref 80.0–100.0)
Monocytes Absolute: 0.3 10*3/uL (ref 0.1–1.0)
Monocytes Relative: 2 %
Neutro Abs: 9.7 10*3/uL — ABNORMAL HIGH (ref 1.7–7.7)
Neutrophils Relative %: 91 %
Platelets: 464 10*3/uL — ABNORMAL HIGH (ref 150–400)
RBC: 4.97 MIL/uL (ref 4.22–5.81)
RDW: 13 % (ref 11.5–15.5)
WBC: 10.7 10*3/uL — ABNORMAL HIGH (ref 4.0–10.5)
nRBC: 0 % (ref 0.0–0.2)

## 2019-10-19 LAB — FERRITIN: Ferritin: 982 ng/mL — ABNORMAL HIGH (ref 24–336)

## 2019-10-19 LAB — COMPREHENSIVE METABOLIC PANEL
ALT: 36 U/L (ref 0–44)
AST: 20 U/L (ref 15–41)
Albumin: 3.1 g/dL — ABNORMAL LOW (ref 3.5–5.0)
Alkaline Phosphatase: 77 U/L (ref 38–126)
Anion gap: 12 (ref 5–15)
BUN: 20 mg/dL (ref 6–20)
CO2: 22 mmol/L (ref 22–32)
Calcium: 8.7 mg/dL — ABNORMAL LOW (ref 8.9–10.3)
Chloride: 102 mmol/L (ref 98–111)
Creatinine, Ser: 0.51 mg/dL — ABNORMAL LOW (ref 0.61–1.24)
GFR calc Af Amer: >60 mL/min (ref >60)
GFR calc non Af Amer: >60 mL/min (ref >60)
Glucose, Bld: 126 mg/dL — ABNORMAL HIGH (ref 70–99)
Potassium: 4.2 mmol/L (ref 3.5–5.1)
Sodium: 136 mmol/L (ref 135–145)
Total Bilirubin: 1.3 mg/dL — ABNORMAL HIGH (ref 0.3–1.2)
Total Protein: 6.9 g/dL (ref 6.5–8.1)

## 2019-10-19 LAB — GLUCOSE, CAPILLARY
Glucose-Capillary: 156 mg/dL — ABNORMAL HIGH (ref 70–99)
Glucose-Capillary: 215 mg/dL — ABNORMAL HIGH (ref 70–99)
Glucose-Capillary: 177 mg/dL — ABNORMAL HIGH (ref 70–99)
Glucose-Capillary: 169 mg/dL — ABNORMAL HIGH (ref 70–99)

## 2019-10-19 LAB — MAGNESIUM: Magnesium: 2.1 mg/dL (ref 1.7–2.4)

## 2019-10-19 LAB — D-DIMER, QUANTITATIVE: D-Dimer, Quant: 3.06 ug/mL-FEU — ABNORMAL HIGH (ref 0.00–0.50)

## 2019-10-19 LAB — C-REACTIVE PROTEIN: CRP: 2.7 mg/dL — ABNORMAL HIGH (ref <1.0)

## 2019-10-19 MED ORDER — INFLUENZA VAC SPLIT QUAD 0.5 ML IM SUSY
0.5000 mL | PREFILLED_SYRINGE | INTRAMUSCULAR | Status: AC
  Administered 2019-10-23: 0.5 mL via INTRAMUSCULAR
  Filled 2019-10-19: qty 0.5

## 2019-10-19 NOTE — Progress Notes (Signed)
Oriented pt to room and unit. Pt understands some English and able to speak small sentences in [place]. Pt arrived on unit on 3L O2 via [place]. Pt now on RA with sats 94% Pt has no complaints. VSS. Will continue to monitor.

## 2019-10-19 NOTE — Plan of Care (Signed)
  Problem: Education: Goal: Knowledge of risk factors and measures for prevention of condition will improve Outcome: Progressing   Problem: Coping: Goal: Psychosocial and spiritual needs will be supported Outcome: Progressing   Problem: Respiratory: Goal: Will maintain a patent airway Outcome: Progressing   Problem: Education: Goal: Knowledge of General Education information will improve Description: Including pain rating scale, medication(s)/side effects and non-pharmacologic comfort measures Outcome: Progressing   Problem: Health Behavior/Discharge Planning: Goal: Ability to manage health-related needs will improve Outcome: Progressing   Problem: Clinical Measurements: Goal: Ability to maintain clinical measurements within normal limits will improve Outcome: Progressing Goal: Will remain free from infection Outcome: Progressing Goal: Diagnostic test results will improve Outcome: Progressing Goal: Respiratory complications will improve Outcome: Progressing Goal: Cardiovascular complication will be avoided Outcome: Progressing   Problem: Activity: Goal: Risk for activity intolerance will decrease Outcome: Progressing   Problem: Nutrition: Goal: Adequate nutrition will be maintained Outcome: Progressing   Problem: Coping: Goal: Level of anxiety will decrease Outcome: Progressing   Problem: Elimination: Goal: Will not experience complications related to bowel motility Outcome: Progressing Goal: Will not experience complications related to urinary retention Outcome: Progressing   Problem: Pain Managment: Goal: General experience of comfort will improve Outcome: Progressing   Problem: Safety: Goal: Ability to remain free from injury will improve Outcome: Progressing   Problem: Skin Integrity: Goal: Risk for impaired skin integrity will decrease Outcome: Progressing   

## 2019-10-19 NOTE — Progress Notes (Signed)
[facility] PROGRESS NOTE    Progress Note  [name]  CN:[id number] DOB: [birth year] DOA: 10/15/2019 PCP: Patient, No Pcp Per     Brief Narrative:   Walter Phillips is an 58 y.o. male past medical history significant for diabetes mellitus type 2, hyperlipidemia diverticulosis status post sigmoid colostomy that comes in complaining of difficulty breathing.  He relates has been having fever sore throat headache diarrhea nausea cough and chest discomfort.  On admission to the emergency room he was found to be satting 88%, breathing 24-30 times per minute placed on a nonrebreather.  Was found to have a leukocytosis and elevated lactate with a chest x-ray showing bilateral atypical pneumonia flu swab was negative, SARS-CoV-2 PCR was positive.  Assessment/Plan:   Sepsis/acute respiratory failure with hypoxia secondary to pneumonia due to COVID-19: His oxygen saturation is improving, his oxygen needs are also improving, he is currently requiring 6 to 8 L of high flow nasal count to keep saturations greater than 88%. Continue IV remdesivir, steroids vitamin C and zinc.  He is status post convalescent plasma and Actemra on 10/16/2019.  Will be his last day of IV remdesivir continue steroids for now. His inflammatory markers continue to improve.  He relates his breathing is significantly better than yesterday. Keep the patient prone for at least 16 hours a day, if not prone out of bed to chair.  Uncontrolled type 2 diabetes mellitus with hyperglycemia (HCC) A1c of 7.9, currently on long-acting insulin plus sliding scale blood glucose is fairly controlled.  DVT prophylaxis: lovenox Family Communication:none Disposition Plan/Barrier to D/C: Unable to determine. Code Status:     Code Status Orders  (From admission, onward)         Start     Ordered   10/15/19 0732  Full code  Continuous     10/15/19 0737        Code Status History    This patient has a current code status but no  historical code status.   Advance Care Planning Activity        IV Access:    Peripheral IV   Procedures and diagnostic studies:   No results found.   Medical Consultants:    None.  Anti-Infectives:   IV remdesivir  Subjective:    [name] he relates his breathing is better than yesterday, in a better mood this morning.  Objective:    Vitals:   10/18/19 2010 10/19/19 0002 10/19/19 0345 10/19/19 0727  BP: 116/63 122/71 (!) 97/57 112/64  Pulse: 75 (!) 57 86 67  Resp: (!) 22 20 20 20   Temp: 98.2 F (36.8 C) 97.7 F (36.5 C) 97.9 F (36.6 C) 97.9 F (36.6 C)  TempSrc: Oral Oral Oral Oral  SpO2: 92% 100% 92% (!) 82%  Weight:      Height:       SpO2: (!) 82 % O2 Flow Rate (L/min): 8 L/min  No intake or output data in the 24 hours ending 10/19/19 0756 Filed Weights   10/15/19 0926 10/15/19 1445  Weight: 75.8 kg 70.7 kg    Exam: General exam: In no acute distress. Respiratory system: Good air movement and diffuse crackles bilaterally. Cardiovascular system: S1 & S2 heard, RRR. No JVD. Gastrointestinal system: Abdomen is nondistended, soft and nontender.  Central nervous system: Alert and oriented. No focal neurological deficits. Extremities: No pedal edema. Skin: No rashes, lesions or ulcers Psychiatry: Judgement and insight appear normal. Mood & affect appropriate.    Data Reviewed:  Labs: Basic Metabolic Panel: Recent Labs  Lab 10/15/19 0458 10/15/19 0832 10/16/19 0440 10/17/19 0315 10/18/19 0323 10/19/19 0106  NA 136 137 137 136 137 136  K 3.9 3.8 3.6 4.1 3.9 4.2  CL 104  --  101 102 102 102  CO2 19*  --  21* 25 23 22   GLUCOSE 141*  --  97 158* 132* 126*  BUN 15  --  17 20 23* 20  CREATININE 0.52*  --  0.57* 0.40* 0.54* 0.51*  CALCIUM 8.7*  --  8.6* 8.8* 8.9 8.7*  MG  --   --  1.8 2.1 2.0 2.1   GFR Estimated Creatinine Clearance: 85.4 mL/min (A) (by C-G formula based on SCr of 0.51 mg/dL (L)). Liver Function Tests:  Recent Labs  Lab 10/15/19 0458 10/16/19 0440 10/17/19 0315 10/18/19 0323 10/19/19 0106  AST 44* 39 25 22 20   ALT 56* 49* 37 37 36  ALKPHOS 70 75 81 83 77  BILITOT 1.2 1.3* 1.1 1.0 1.3*  PROT 6.9 7.3 7.1 7.0 6.9  ALBUMIN 2.9* 3.0* 3.1* 3.0* 3.1*   No results for input(s): LIPASE, AMYLASE in the last 168 hours. No results for input(s): AMMONIA in the last 168 hours. Coagulation profile No results for input(s): INR, PROTIME in the last 168 hours. COVID-19 Labs  Recent Labs    10/17/19 0315 10/18/19 0323 10/19/19 0106  DDIMER 5.91* 2.87* 3.06*  FERRITIN 1,261* 1,087* 982*  CRP 16.6* 7.1* 2.7*    Lab Results  Component Value Date   SARSCOV2NAA POSITIVE (A) 10/15/2019    CBC: Recent Labs  Lab 10/15/19 0458 10/15/19 0832 10/16/19 0440 10/17/19 0315 10/18/19 0323 10/19/19 0106  WBC 11.3*  --  13.3* 9.3 9.6 10.7*  NEUTROABS 10.0*  --  12.1* 8.7* 8.8* 9.7*  HGB 14.6 13.3 15.3 14.0 15.3 15.4  HCT 42.7 39.0 44.6 40.6 44.9 45.0  MCV 89.1  --  89.7 90.0 90.3 90.5  PLT 386  --  405* 382 441* 464*   Cardiac Enzymes: No results for input(s): CKTOTAL, CKMB, CKMBINDEX, TROPONINI in the last 168 hours. BNP (last 3 results) No results for input(s): PROBNP in the last 8760 hours. CBG: Recent Labs  Lab 10/17/19 1922 10/18/19 0718 10/18/19 1131 10/18/19 1708 10/18/19 2025  GLUCAP 239* 152* 263* 147* 241*   D-Dimer: Recent Labs    10/18/19 0323 10/19/19 0106  DDIMER 2.87* 3.06*   Hgb A1c: No results for input(s): HGBA1C in the last 72 hours. Lipid Profile: No results for input(s): CHOL, HDL, LDLCALC, TRIG, CHOLHDL, LDLDIRECT in the last 72 hours. Thyroid function studies: No results for input(s): TSH, T4TOTAL, T3FREE, THYROIDAB in the last 72 hours.  Invalid input(s): FREET3 Anemia work up: Recent Labs    10/18/19 0323 10/19/19 0106  FERRITIN 1,087* 982*   Sepsis Labs: Recent Labs  Lab 10/15/19 0458 10/15/19 0804 10/16/19 0440 10/17/19 0315  10/18/19 0323 10/19/19 0106  PROCALCITON <0.10 <0.10  --   --   --   --   WBC 11.3*  --  13.3* 9.3 9.6 10.7*  LATICACIDVEN 2.3* 1.8  --   --   --   --    Microbiology Recent Results (from the past 240 hour(s))  Blood Culture (routine x 2)     Status: None (Preliminary result)   Collection Time: 10/15/19  4:58 AM   Specimen: BLOOD RIGHT FOREARM  Result Value Ref Range Status   Specimen Description BLOOD RIGHT FOREARM  Final   Special Requests  Final    BOTTLES DRAWN AEROBIC AND ANAEROBIC Blood Culture results may not be optimal due to an inadequate volume of blood received in culture bottles   Culture   Final    NO GROWTH 3 DAYS Performed at [facility], [place] [address], [place], [place] [address]    Report Status PENDING  Incomplete  Blood Culture (routine x 2)     Status: None (Preliminary result)   Collection Time: 10/15/19  5:03 AM   Specimen: BLOOD RIGHT FOREARM  Result Value Ref Range Status   Specimen Description BLOOD RIGHT FOREARM  Final   Special Requests   Final    BOTTLES DRAWN AEROBIC AND ANAEROBIC Blood Culture results may not be optimal due to an inadequate volume of blood received in culture bottles   Culture   Final    NO GROWTH 3 DAYS Performed at [facility], [place] [address] [place], [place] [address]    Report Status PENDING  Incomplete  Respiratory Panel by RT PCR (Flu A&B, Covid) - Nasopharyngeal Swab     Status: Abnormal   Collection Time: 10/15/19  5:26 AM   Specimen: Nasopharyngeal Swab  Result Value Ref Range Status   SARS Coronavirus 2 by RT PCR POSITIVE (A) NEGATIVE Final    Comment: RESULT CALLED TO, READ BACK BY AND VERIFIED WITH: RN MORRIS, TAYLOR [place] [id number] FCP (NOTE) SARS-CoV-2 target nucleic acids are DETECTED. SARS-CoV-2 RNA is generally detectable in upper respiratory specimens  during the acute phase of infection. Positive results are indicative of the presence of the identified virus, but do not rule out bacterial  infection or co-infection with other pathogens not detected by the test. Clinical correlation with patient history and other diagnostic information is necessary to determine patient infection status. The expected result is Negative. Fact Sheet for Patients:  PinkCheek.be Fact Sheet for Healthcare Providers: GravelBags.it This test is not yet approved or cleared by the [place] FDA and  has been authorized for detection and/or diagnosis of SARS-CoV-2 by FDA under an Emergency Use Authorization (EUA).  This EUA will remain in effect (meaning this test can be used) for  the duration of  the COVID-19 declaration under Section 564(b)(1) of the Act, 21 U.S.C. section 360bbb-3(b)(1), unless the authorization is terminated or revoked sooner.    Influenza A by PCR NEGATIVE NEGATIVE Final   Influenza B by PCR NEGATIVE NEGATIVE Final    Comment: (NOTE) The Xpert Xpress SARS-CoV-2/FLU/RSV assay is intended as an aid in  the diagnosis of influenza from Nasopharyngeal swab specimens and  should not be used as a sole basis for treatment. Nasal washings and  aspirates are unacceptable for Xpert Xpress SARS-CoV-2/FLU/RSV  testing. Fact Sheet for Patients: PinkCheek.be Fact Sheet for Healthcare Providers: GravelBags.it This test is not yet approved or cleared by the [place] FDA and  has been authorized for detection and/or diagnosis of SARS-CoV-2 by  FDA under an Emergency Use Authorization (EUA). This EUA will remain  in effect (meaning this test can be used) for the duration of the  Covid-19 declaration under Section 564(b)(1) of the Act, 21  U.S.C. section 360bbb-3(b)(1), unless the authorization is  terminated or revoked. Performed at [facility], [place] [address], [place],  [address]   Culture, sputum-assessment     Status: None   Collection Time: 10/15/19   3:18 PM   Specimen: Expectorated Sputum  Result Value Ref Range Status   Specimen Description EXPECTORATED SPUTUM  Final  Special Requests NONE  Final   Sputum evaluation   Final    Sputum specimen not acceptable for testing.  Please recollect.   Performed at [facility], [place] [address], [place], [place] [address]    Report Status 10/15/2019 FINAL  Final     Medications:   . albuterol  2 puff Inhalation Q6H  . vitamin C  500 mg Oral Daily  . dexamethasone (DECADRON) injection  6 mg Intravenous Q12H  . enoxaparin (LOVENOX) injection  40 mg Subcutaneous Q24H  . famotidine  20 mg Oral BID  . insulin aspart  0-15 Units Subcutaneous TID WC  . insulin aspart  0-5 Units Subcutaneous QHS  . insulin aspart  6 Units Subcutaneous TID WC  . insulin detemir  5 Units Subcutaneous QHS  . metoprolol tartrate  25 mg Oral BID  . zinc sulfate  220 mg Oral Daily   Continuous Infusions: . remdesivir 100 mg in NS 100 mL 100 mg (10/18/19 1033)      LOS: 4 days   Charlynne Cousins  [facility]  10/19/2019, 7:56 AM

## 2019-10-20 LAB — CBC WITH DIFFERENTIAL/PLATELET
Abs Immature Granulocytes: 0.17 10*3/uL — ABNORMAL HIGH (ref 0.00–0.07)
Basophils Absolute: 0 10*3/uL (ref 0.0–0.1)
Basophils Relative: 0 %
Eosinophils Absolute: 0 10*3/uL (ref 0.0–0.5)
Eosinophils Relative: 0 %
HCT: 46.6 % (ref 39.0–52.0)
Hemoglobin: 16.1 g/dL (ref 13.0–17.0)
Immature Granulocytes: 2 %
Lymphocytes Relative: 4 %
Lymphs Abs: 0.4 10*3/uL — ABNORMAL LOW (ref 0.7–4.0)
MCH: 31 pg (ref 26.0–34.0)
MCHC: 34.5 g/dL (ref 30.0–36.0)
MCV: 89.8 fL (ref 80.0–100.0)
Monocytes Absolute: 0.2 10*3/uL (ref 0.1–1.0)
Monocytes Relative: 2 %
Neutro Abs: 9.5 10*3/uL — ABNORMAL HIGH (ref 1.7–7.7)
Neutrophils Relative %: 92 %
Platelets: 401 10*3/uL — ABNORMAL HIGH (ref 150–400)
RBC: 5.19 MIL/uL (ref 4.22–5.81)
RDW: 13.2 % (ref 11.5–15.5)
WBC: 10.4 10*3/uL (ref 4.0–10.5)
nRBC: 0 % (ref 0.0–0.2)

## 2019-10-20 LAB — COMPREHENSIVE METABOLIC PANEL
ALT: 38 U/L (ref 0–44)
AST: 20 U/L (ref 15–41)
Albumin: 2.9 g/dL — ABNORMAL LOW (ref 3.5–5.0)
Alkaline Phosphatase: 76 U/L (ref 38–126)
Anion gap: 9 (ref 5–15)
BUN: 22 mg/dL — ABNORMAL HIGH (ref 6–20)
CO2: 23 mmol/L (ref 22–32)
Calcium: 8.6 mg/dL — ABNORMAL LOW (ref 8.9–10.3)
Chloride: 102 mmol/L (ref 98–111)
Creatinine, Ser: 0.58 mg/dL — ABNORMAL LOW (ref 0.61–1.24)
GFR calc Af Amer: >60 mL/min (ref >60)
GFR calc non Af Amer: >60 mL/min (ref >60)
Glucose, Bld: 159 mg/dL — ABNORMAL HIGH (ref 70–99)
Potassium: 4.6 mmol/L (ref 3.5–5.1)
Sodium: 134 mmol/L — ABNORMAL LOW (ref 135–145)
Total Bilirubin: 0.9 mg/dL (ref 0.3–1.2)
Total Protein: 6.6 g/dL (ref 6.5–8.1)

## 2019-10-20 LAB — CULTURE, BLOOD (ROUTINE X 2)
Culture: NO GROWTH
Culture: NO GROWTH

## 2019-10-20 LAB — FERRITIN: Ferritin: 837 ng/mL — ABNORMAL HIGH (ref 24–336)

## 2019-10-20 LAB — GLUCOSE, CAPILLARY
Glucose-Capillary: 146 mg/dL — ABNORMAL HIGH (ref 70–99)
Glucose-Capillary: 152 mg/dL — ABNORMAL HIGH (ref 70–99)
Glucose-Capillary: 240 mg/dL — ABNORMAL HIGH (ref 70–99)
Glucose-Capillary: 271 mg/dL — ABNORMAL HIGH (ref 70–99)
Glucose-Capillary: 296 mg/dL — ABNORMAL HIGH (ref 70–99)

## 2019-10-20 LAB — D-DIMER, QUANTITATIVE: D-Dimer, Quant: 3.94 ug/mL-FEU — ABNORMAL HIGH (ref 0.00–0.50)

## 2019-10-20 LAB — C-REACTIVE PROTEIN: CRP: 1 mg/dL — ABNORMAL HIGH (ref <1.0)

## 2019-10-20 LAB — MAGNESIUM: Magnesium: 2.1 mg/dL (ref 1.7–2.4)

## 2019-10-20 NOTE — Progress Notes (Signed)
[facility] PROGRESS NOTE    Progress Note  [name]  [id number] DOB: 23-Jun-1961 DOA: 10/15/2019 PCP: Patient, No Pcp Per     Brief Narrative:   Vasilis Relles is an 58 y.o. male past medical history significant for diabetes mellitus type 2, hyperlipidemia diverticulosis status post sigmoid colostomy that comes in complaining of difficulty breathing.  He relates has been having fever sore throat headache diarrhea nausea cough and chest discomfort.  On admission to the emergency room he was found to be satting 88%, breathing 24-30 times per minute placed on a nonrebreather.  Was found to have a leukocytosis and elevated lactate with a chest x-ray showing bilateral atypical pneumonia flu swab was negative, SARS-CoV-2 PCR was positive.  Assessment/Plan:   Sepsis/acute respiratory failure with hypoxia secondary to pneumonia due to COVID-19: His oxygen requirements continued to improve, this morning he was on 3 L to keep saturations greater than 89%. Completed his course of IV remdesivir, will continue steroids for total of 10 days. He is status post convalescent plasma and Actemra.  His inflammatory markers continue to improve. Try to keep the patient peripherally 16 hours a day for now prone out of bed to chair. He relates his breathing feels better than yesterday, but he was just recently bumped to 7 L of high flow nasal cannula as his saturations were in the low 80s.  Uncontrolled type 2 diabetes mellitus with hyperglycemia (HCC) A1c of 7.9 continue long-acting insulin plus sliding scale. Blood glucose is fairly controlled.  DVT prophylaxis: lovenox Family Communication:none Disposition Plan/Barrier to D/C: Unable to determine. Code Status:     Code Status Orders  (From admission, onward)         Start     Ordered   10/15/19 0732  Full code  Continuous     10/15/19 0737        Code Status History    This patient has a current code status but no historical code  status.   Advance Care Planning Activity        IV Access:    Peripheral IV   Procedures and diagnostic studies:   No results found.   Medical Consultants:    None.  Anti-Infectives:   IV remdesivir  Subjective:    Wilmore Headley relates his breathing is better than yesterday.  Objective:    Vitals:   10/19/19 1637 10/19/19 1800 10/19/19 2000 10/20/19 0500  BP:   119/72 97/61  Pulse:   65 60  Resp: 20  18 20   Temp:   98.2 F (36.8 C) 98 F (36.7 C)  TempSrc:   Oral Oral  SpO2: 94% 94% (!) 89% (!) 89%  Weight:      Height:       SpO2: (!) 89 % O2 Flow Rate (L/min): 3 L/min   Intake/Output Summary (Last 24 hours) at 10/20/2019 0751 Last data filed at 10/19/2019 1900 Gross per 24 hour  Intake --  Output 450 ml  Net -450 ml   Filed Weights   10/15/19 0926 10/15/19 1445  Weight: 75.8 kg 70.7 kg    Exam: General exam: In no acute distress. Respiratory system: Good air movement and significant less crackles today compared to yesterday, barely audible. Cardiovascular system: S1 & S2 heard, RRR. No JVD. Gastrointestinal system: Abdomen is nondistended, soft and nontender.  Central nervous system: Alert and oriented. No focal neurological deficits. Extremities: No pedal edema. Skin: No rashes, lesions or ulcers Psychiatry: Judgement and insight appear normal. Mood &  affect appropriate.    Data Reviewed:    Labs: Basic Metabolic Panel: Recent Labs  Lab 10/16/19 0440 10/17/19 0315 10/18/19 0323 10/19/19 0106 10/20/19 0222  NA 137 136 137 136 134*  K 3.6 4.1 3.9 4.2 4.6  CL 101 102 102 102 102  CO2 21* 25 23 22 23   GLUCOSE 97 158* 132* 126* 159*  BUN 17 20 23* 20 22*  CREATININE 0.57* 0.40* 0.54* 0.51* 0.58*  CALCIUM 8.6* 8.8* 8.9 8.7* 8.6*  MG 1.8 2.1 2.0 2.1 2.1   GFR Estimated Creatinine Clearance: 85.4 mL/min (A) (by C-G formula based on SCr of 0.58 mg/dL (L)). Liver Function Tests: Recent Labs  Lab 10/16/19 0440  10/17/19 0315 10/18/19 0323 10/19/19 0106 10/20/19 0222  AST 39 25 22 20 20   ALT 49* 37 37 36 38  ALKPHOS 75 81 83 77 76  BILITOT 1.3* 1.1 1.0 1.3* 0.9  PROT 7.3 7.1 7.0 6.9 6.6  ALBUMIN 3.0* 3.1* 3.0* 3.1* 2.9*   No results for input(s): LIPASE, AMYLASE in the last 168 hours. No results for input(s): AMMONIA in the last 168 hours. Coagulation profile No results for input(s): INR, PROTIME in the last 168 hours. COVID-19 Labs  Recent Labs    10/18/19 0323 10/19/19 0106 10/20/19 0222  DDIMER 2.87* 3.06* 3.94*  FERRITIN 1,087* 982* 837*  CRP 7.1* 2.7* 1.0*    Lab Results  Component Value Date   SARSCOV2NAA POSITIVE (A) 10/15/2019    CBC: Recent Labs  Lab 10/16/19 0440 10/17/19 0315 10/18/19 0323 10/19/19 0106 10/20/19 0222  WBC 13.3* 9.3 9.6 10.7* 10.4  NEUTROABS 12.1* 8.7* 8.8* 9.7* 9.5*  HGB 15.3 14.0 15.3 15.4 16.1  HCT 44.6 40.6 44.9 45.0 46.6  MCV 89.7 90.0 90.3 90.5 89.8  PLT 405* 382 441* 464* 401*   Cardiac Enzymes: No results for input(s): CKTOTAL, CKMB, CKMBINDEX, TROPONINI in the last 168 hours. BNP (last 3 results) No results for input(s): PROBNP in the last 8760 hours. CBG: Recent Labs  Lab 10/19/19 0805 10/19/19 1208 10/19/19 1642 10/19/19 2048 10/20/19 0126  GLUCAP 156* 215* 177* 169* 146*   D-Dimer: Recent Labs    10/19/19 0106 10/20/19 0222  DDIMER 3.06* 3.94*   Hgb A1c: No results for input(s): HGBA1C in the last 72 hours. Lipid Profile: No results for input(s): CHOL, HDL, LDLCALC, TRIG, CHOLHDL, LDLDIRECT in the last 72 hours. Thyroid function studies: No results for input(s): TSH, T4TOTAL, T3FREE, THYROIDAB in the last 72 hours.  Invalid input(s): FREET3 Anemia work up: Recent Labs    10/19/19 0106 10/20/19 0222  FERRITIN 982* 837*   Sepsis Labs: Recent Labs  Lab 10/15/19 0458 10/15/19 0804 10/17/19 0315 10/18/19 0323 10/19/19 0106 10/20/19 0222  PROCALCITON <0.10 <0.10  --   --   --   --   WBC 11.3*  --   9.3 9.6 10.7* 10.4  LATICACIDVEN 2.3* 1.8  --   --   --   --    Microbiology Recent Results (from the past 240 hour(s))  Blood Culture (routine x 2)     Status: None   Collection Time: 10/15/19  4:58 AM   Specimen: BLOOD RIGHT FOREARM  Result Value Ref Range Status   Specimen Description BLOOD RIGHT FOREARM  Final   Special Requests   Final    BOTTLES DRAWN AEROBIC AND ANAEROBIC Blood Culture results may not be optimal due to an inadequate volume of blood received in culture bottles   Culture   Final  NO GROWTH 5 DAYS Performed at [facility], [place] [address], [place], [place] [address]    Report Status 10/20/2019 FINAL  Final  Blood Culture (routine x 2)     Status: None   Collection Time: 10/15/19  5:03 AM   Specimen: BLOOD RIGHT FOREARM  Result Value Ref Range Status   Specimen Description BLOOD RIGHT FOREARM  Final   Special Requests   Final    BOTTLES DRAWN AEROBIC AND ANAEROBIC Blood Culture results may not be optimal due to an inadequate volume of blood received in culture bottles   Culture   Final    NO GROWTH 5 DAYS Performed at [facility], [place] [address], [place], [place] [address]    Report Status 10/20/2019 FINAL  Final  Respiratory Panel by RT PCR (Flu A&B, Covid) - Nasopharyngeal Swab     Status: Abnormal   Collection Time: 10/15/19  5:26 AM   Specimen: Nasopharyngeal Swab  Result Value Ref Range Status   SARS Coronavirus 2 by RT PCR POSITIVE (A) NEGATIVE Final    Comment: RESULT CALLED TO, READ BACK BY AND VERIFIED WITH: RN MORRIS, TAYLOR [place] [id number] FCP (NOTE) SARS-CoV-2 target nucleic acids are DETECTED. SARS-CoV-2 RNA is generally detectable in upper respiratory specimens  during the acute phase of infection. Positive results are indicative of the presence of the identified virus, but do not rule out bacterial infection or co-infection with other pathogens not detected by the test. Clinical correlation with patient history and other  diagnostic information is necessary to determine patient infection status. The expected result is Negative. Fact Sheet for Patients:  PinkCheek.be Fact Sheet for Healthcare Providers: GravelBags.it This test is not yet approved or cleared by the [place] FDA and  has been authorized for detection and/or diagnosis of SARS-CoV-2 by FDA under an Emergency Use Authorization (EUA).  This EUA will remain in effect (meaning this test can be used) for  the duration of  the COVID-19 declaration under Section 564(b)(1) of the Act, 21 U.S.C. section 360bbb-3(b)(1), unless the authorization is terminated or revoked sooner.    Influenza A by PCR NEGATIVE NEGATIVE Final   Influenza B by PCR NEGATIVE NEGATIVE Final    Comment: (NOTE) The Xpert Xpress SARS-CoV-2/FLU/RSV assay is intended as an aid in  the diagnosis of influenza from Nasopharyngeal swab specimens and  should not be used as a sole basis for treatment. Nasal washings and  aspirates are unacceptable for Xpert Xpress SARS-CoV-2/FLU/RSV  testing. Fact Sheet for Patients: PinkCheek.be Fact Sheet for Healthcare Providers: GravelBags.it This test is not yet approved or cleared by the [place] FDA and  has been authorized for detection and/or diagnosis of SARS-CoV-2 by  FDA under an Emergency Use Authorization (EUA). This EUA will remain  in effect (meaning this test can be used) for the duration of the  Covid-19 declaration under Section 564(b)(1) of the Act, 21  U.S.C. section 360bbb-3(b)(1), unless the authorization is  terminated or revoked. Performed at [facility], [place] [address] [place], [place] [address]   Culture, sputum-assessment     Status: None   Collection Time: 10/15/19  3:18 PM   Specimen: Expectorated Sputum  Result Value Ref Range Status   Specimen Description EXPECTORATED SPUTUM  Final    Special Requests NONE  Final   Sputum evaluation   Final    Sputum specimen not acceptable for testing.  Please recollect.   Performed at [facility], [place]  [address], [place], [place] [address]    Report Status 10/15/2019 FINAL  Final     Medications:   . albuterol  2 puff Inhalation Q6H  . vitamin C  500 mg Oral Daily  . dexamethasone (DECADRON) injection  6 mg Intravenous Q12H  . enoxaparin (LOVENOX) injection  40 mg Subcutaneous Q24H  . famotidine  20 mg Oral BID  . influenza vac split quadrivalent PF  0.5 mL Intramuscular Tomorrow-1000  . insulin aspart  0-15 Units Subcutaneous TID WC  . insulin aspart  0-5 Units Subcutaneous QHS  . insulin aspart  6 Units Subcutaneous TID WC  . insulin detemir  5 Units Subcutaneous QHS  . metoprolol tartrate  25 mg Oral BID  . zinc sulfate  220 mg Oral Daily   Continuous Infusions:     LOS: 5 days   Charlynne Cousins  [facility]  10/20/2019, 7:51 AM

## 2019-10-21 LAB — GLUCOSE, CAPILLARY
Glucose-Capillary: 149 mg/dL — ABNORMAL HIGH (ref 70–99)
Glucose-Capillary: 160 mg/dL — ABNORMAL HIGH (ref 70–99)
Glucose-Capillary: 179 mg/dL — ABNORMAL HIGH (ref 70–99)
Glucose-Capillary: 267 mg/dL — ABNORMAL HIGH (ref 70–99)
Glucose-Capillary: 235 mg/dL — ABNORMAL HIGH (ref 70–99)

## 2019-10-21 LAB — D-DIMER, QUANTITATIVE: D-Dimer, Quant: 3.61 ug/mL-FEU — ABNORMAL HIGH (ref 0.00–0.50)

## 2019-10-21 LAB — C-REACTIVE PROTEIN: CRP: 0.5 mg/dL (ref <1.0)

## 2019-10-21 MED ORDER — DEXAMETHASONE 6 MG PO TABS
6.0000 mg | ORAL_TABLET | Freq: Every day | ORAL | Status: AC
  Administered 2019-10-21 – 2019-10-23 (×3): 6 mg via ORAL
  Filled 2019-10-21 (×3): qty 1

## 2019-10-21 NOTE — Progress Notes (Addendum)
Ambulated [place]. Required 4-5 L 02. Pt become SHOB and had coughing episode. currently resting in bed prone position on 4L no s/s of respiratory distress.

## 2019-10-21 NOTE — Progress Notes (Signed)
Weaned from [facility] HFNC to 4L sats 91%. Will continue to monitor

## 2019-10-21 NOTE — Progress Notes (Signed)
[facility] PROGRESS NOTE    Progress Note  Walter Phillips  [id number] DOB: [birth year] DOA: 10/15/2019 PCP: Patient, No Pcp Per     Brief Narrative:   Walter Phillips is an 58 y.o. male past medical history significant for diabetes mellitus type 2, hyperlipidemia diverticulosis status post sigmoid colostomy that comes in complaining of difficulty breathing.  He relates has been having fever sore throat headache diarrhea nausea cough and chest discomfort.  On admission to the emergency room he was found to be satting 88%, breathing 24-30 times per minute placed on a nonrebreather.  Was found to have a leukocytosis and elevated lactate with a chest x-ray showing bilateral atypical pneumonia flu swab was negative, SARS-CoV-2 PCR was positive.  Assessment/Plan:   Sepsis/acute respiratory failure with hypoxia secondary to pneumonia due to COVID-19: His oxygen requirements are worsening, on 10/19/2019 he was requiring 3 L, this morning and yesterday he is requiring 5-6. Complete his course of IV remdesivir, continued steroids for total of 10 days. He is status post convalescent plasma and Actemra, inflammatory markers were trending down last check on 10/19/2019, will recheck CRP and D-dimer today. Try to keep him prone for early 16 hours a day, if not prone out of bed to chair.  Uncontrolled type 2 diabetes mellitus with hyperglycemia (HCC) A1c of 7.9, his blood glucose is fairly controlled on long-acting insulin plus sliding scale. He is to note that he is on steroids.   DVT prophylaxis: lovenox Family Communication:none Disposition Plan/Barrier to D/C: Unable to determine. Code Status:     Code Status Orders  (From admission, onward)         Start     Ordered   10/15/19 0732  Full code  Continuous     10/15/19 0737        Code Status History    This patient has a current code status but no historical code status.   Advance Care Planning Activity        IV Access:     Peripheral IV   Procedures and diagnostic studies:   No results found.   Medical Consultants:    None.  Anti-Infectives:   IV remdesivir  Subjective:    Walter Phillips relates his breathing continues to improve.  Objective:    Vitals:   10/20/19 1621 10/20/19 2004 10/21/19 0400 10/21/19 0813  BP: 113/73 128/76 (!) 99/59 (!) 140/116  Pulse: 71 67 (!) 56 61  Resp: (!) 22 20 20 20   Temp: 98 F (36.7 C) 98.1 F (36.7 C) 98.4 F (36.9 C) 97.7 F (36.5 C)  TempSrc: Oral Oral Oral Oral  SpO2: 90% 95% 96% (!) 88%  Weight:      Height:       SpO2: (!) 88 % O2 Flow Rate (L/min): 5 L/min   Intake/Output Summary (Last 24 hours) at 10/21/2019 0844 Last data filed at 10/21/2019 0427 Gross per 24 hour  Intake 1320 ml  Output 650 ml  Net 670 ml   Filed Weights   10/15/19 0926 10/15/19 1445  Weight: 75.8 kg 70.7 kg    Exam: General exam: In no acute distress. Respiratory system: Good air movement and diffuse crackles bilaterally. Cardiovascular system: S1 & S2 heard, RRR. No JVD. Gastrointestinal system: Abdomen is nondistended, soft and nontender.  Central nervous system: Alert and oriented. No focal neurological deficits. Extremities: No pedal edema. Skin: No rashes, lesions or ulcers Psychiatry: Judgement and insight appear normal. Mood & affect appropriate.    Data  Reviewed:    Labs: Basic Metabolic Panel: Recent Labs  Lab 10/16/19 0440 10/17/19 0315 10/18/19 0323 10/19/19 0106 10/20/19 0222  NA 137 136 137 136 134*  K 3.6 4.1 3.9 4.2 4.6  CL 101 102 102 102 102  CO2 21* 25 23 22 23   GLUCOSE 97 158* 132* 126* 159*  BUN 17 20 23* 20 22*  CREATININE 0.57* 0.40* 0.54* 0.51* 0.58*  CALCIUM 8.6* 8.8* 8.9 8.7* 8.6*  MG 1.8 2.1 2.0 2.1 2.1   GFR Estimated Creatinine Clearance: 85.4 mL/min (A) (by C-G formula based on SCr of 0.58 mg/dL (L)). Liver Function Tests: Recent Labs  Lab 10/16/19 0440 10/17/19 0315 10/18/19 0323 10/19/19 0106  10/20/19 0222  AST 39 25 22 20 20   ALT 49* 37 37 36 38  ALKPHOS 75 81 83 77 76  BILITOT 1.3* 1.1 1.0 1.3* 0.9  PROT 7.3 7.1 7.0 6.9 6.6  ALBUMIN 3.0* 3.1* 3.0* 3.1* 2.9*   No results for input(s): LIPASE, AMYLASE in the last 168 hours. No results for input(s): AMMONIA in the last 168 hours. Coagulation profile No results for input(s): INR, PROTIME in the last 168 hours. COVID-19 Labs  Recent Labs    10/19/19 0106 10/20/19 0222  DDIMER 3.06* 3.94*  FERRITIN 982* 837*  CRP 2.7* 1.0*    Lab Results  Component Value Date   SARSCOV2NAA POSITIVE (A) 10/15/2019    CBC: Recent Labs  Lab 10/16/19 0440 10/17/19 0315 10/18/19 0323 10/19/19 0106 10/20/19 0222  WBC 13.3* 9.3 9.6 10.7* 10.4  NEUTROABS 12.1* 8.7* 8.8* 9.7* 9.5*  HGB 15.3 14.0 15.3 15.4 16.1  HCT 44.6 40.6 44.9 45.0 46.6  MCV 89.7 90.0 90.3 90.5 89.8  PLT 405* 382 441* 464* 401*   Cardiac Enzymes: No results for input(s): CKTOTAL, CKMB, CKMBINDEX, TROPONINI in the last 168 hours. BNP (last 3 results) No results for input(s): PROBNP in the last 8760 hours. CBG: Recent Labs  Lab 10/20/19 0754 10/20/19 1130 10/20/19 1619 10/20/19 2008 10/21/19 0812  GLUCAP 152* 240* 271* 296* 149*   D-Dimer: Recent Labs    10/19/19 0106 10/20/19 0222  DDIMER 3.06* 3.94*   Hgb A1c: No results for input(s): HGBA1C in the last 72 hours. Lipid Profile: No results for input(s): CHOL, HDL, LDLCALC, TRIG, CHOLHDL, LDLDIRECT in the last 72 hours. Thyroid function studies: No results for input(s): TSH, T4TOTAL, T3FREE, THYROIDAB in the last 72 hours.  Invalid input(s): FREET3 Anemia work up: Recent Labs    10/19/19 0106 10/20/19 0222  FERRITIN 982* 837*   Sepsis Labs: Recent Labs  Lab 10/15/19 0458 10/15/19 0804 10/17/19 0315 10/18/19 0323 10/19/19 0106 10/20/19 0222  PROCALCITON <0.10 <0.10  --   --   --   --   WBC 11.3*  --  9.3 9.6 10.7* 10.4  LATICACIDVEN 2.3* 1.8  --   --   --   --     Microbiology Recent Results (from the past 240 hour(s))  Blood Culture (routine x 2)     Status: None   Collection Time: 10/15/19  4:58 AM   Specimen: BLOOD RIGHT FOREARM  Result Value Ref Range Status   Specimen Description BLOOD RIGHT FOREARM  Final   Special Requests   Final    BOTTLES DRAWN AEROBIC AND ANAEROBIC Blood Culture results may not be optimal due to an inadequate volume of blood received in culture bottles   Culture   Final    NO GROWTH 5 DAYS Performed at [facility], [address], [place], [place] [address]    Report Status 10/20/2019 FINAL  Final  Blood Culture (routine x 2)     Status: None   Collection Time: 10/15/19  5:03 AM   Specimen: BLOOD RIGHT FOREARM  Result Value Ref Range Status   Specimen Description BLOOD RIGHT FOREARM  Final   Special Requests   Final    BOTTLES DRAWN AEROBIC AND ANAEROBIC Blood Culture results may not be optimal due to an inadequate volume of blood received in culture bottles   Culture   Final    NO GROWTH 5 DAYS Performed at [facility], [place] [address] [place], [place] [address]    Report Status 10/20/2019 FINAL  Final  Respiratory Panel by RT PCR (Flu A&B, Covid) - Nasopharyngeal Swab     Status: Abnormal   Collection Time: 10/15/19  5:26 AM   Specimen: Nasopharyngeal Swab  Result Value Ref Range Status   SARS Coronavirus 2 by RT PCR POSITIVE (A) NEGATIVE Final    Comment: RESULT CALLED TO, READ BACK BY AND VERIFIED WITH: RN MORRIS, TAYLOR [place] [id number] FCP (NOTE) SARS-CoV-2 target nucleic acids are DETECTED. SARS-CoV-2 RNA is generally detectable in upper respiratory specimens  during the acute phase of infection. Positive results are indicative of the presence of the identified virus, but do not rule out bacterial infection or co-infection with other pathogens not detected by the test. Clinical correlation with patient history and other diagnostic information is necessary to determine patient infection  status. The expected result is Negative. Fact Sheet for Patients:  PinkCheek.be Fact Sheet for Healthcare Providers: GravelBags.it This test is not yet approved or cleared by the [place] FDA and  has been authorized for detection and/or diagnosis of SARS-CoV-2 by FDA under an Emergency Use Authorization (EUA).  This EUA will remain in effect (meaning this test can be used) for  the duration of  the COVID-19 declaration under Section 564(b)(1) of the Act, 21 U.S.C. section 360bbb-3(b)(1), unless the authorization is terminated or revoked sooner.    Influenza A by PCR NEGATIVE NEGATIVE Final   Influenza B by PCR NEGATIVE NEGATIVE Final    Comment: (NOTE) The Xpert Xpress SARS-CoV-2/FLU/RSV assay is intended as an aid in  the diagnosis of influenza from Nasopharyngeal swab specimens and  should not be used as a sole basis for treatment. Nasal washings and  aspirates are unacceptable for Xpert Xpress SARS-CoV-2/FLU/RSV  testing. Fact Sheet for Patients: PinkCheek.be Fact Sheet for Healthcare Providers: GravelBags.it This test is not yet approved or cleared by the [place] FDA and  has been authorized for detection and/or diagnosis of SARS-CoV-2 by  FDA under an Emergency Use Authorization (EUA). This EUA will remain  in effect (meaning this test can be used) for the duration of the  Covid-19 declaration under Section 564(b)(1) of the Act, 21  U.S.C. section 360bbb-3(b)(1), unless the authorization is  terminated or revoked. Performed at [facility], [place] [address] [place], [place] [address]   Culture, sputum-assessment     Status: None   Collection Time: 10/15/19  3:18 PM   Specimen: Expectorated Sputum  Result Value Ref Range Status   Specimen Description EXPECTORATED SPUTUM  Final   Special Requests NONE  Final   Sputum evaluation   Final     Sputum specimen not acceptable for testing.  Please recollect.   Performed at [facility], [place] [address] [place], [place] [address]    Report  Status 10/15/2019 FINAL  Final     Medications:   . albuterol  2 puff Inhalation Q6H  . vitamin C  500 mg Oral Daily  . dexamethasone (DECADRON) injection  6 mg Intravenous Q12H  . enoxaparin (LOVENOX) injection  40 mg Subcutaneous Q24H  . famotidine  20 mg Oral BID  . influenza vac split quadrivalent PF  0.5 mL Intramuscular Tomorrow-1000  . insulin aspart  0-15 Units Subcutaneous TID WC  . insulin aspart  0-5 Units Subcutaneous QHS  . insulin aspart  6 Units Subcutaneous TID WC  . insulin detemir  5 Units Subcutaneous QHS  . metoprolol tartrate  25 mg Oral BID  . zinc sulfate  220 mg Oral Daily   Continuous Infusions:     LOS: 6 days   Charlynne Cousins  [facility]  10/21/2019, 8:44 AM

## 2019-10-22 LAB — D-DIMER, QUANTITATIVE: D-Dimer, Quant: 3.18 ug/mL-FEU — ABNORMAL HIGH (ref 0.00–0.50)

## 2019-10-22 LAB — C-REACTIVE PROTEIN: CRP: 0.8 mg/dL (ref <1.0)

## 2019-10-22 LAB — GLUCOSE, CAPILLARY
Glucose-Capillary: 112 mg/dL — ABNORMAL HIGH (ref 70–99)
Glucose-Capillary: 163 mg/dL — ABNORMAL HIGH (ref 70–99)
Glucose-Capillary: 216 mg/dL — ABNORMAL HIGH (ref 70–99)

## 2019-10-22 NOTE — Progress Notes (Signed)
PROGRESS NOTE    Walter Phillips  OK:[id number] DOB: 10-03-1961 DOA: 10/15/2019 PCP: Patient, No Pcp Per    Brief Narrative:  58 year old gentleman with history of type 2 diabetes on Metformin, hyperlipidemia, diverticulosis who presented to the emergency room with complaining of shortness of breath.  Patient was tachypneic, 88% on room air, initially placed on nonrebreather.  Admitted with COVID-19 pneumonia.   Assessment & Plan:   Principal Problem:   Sepsis ([place]) Active Problems:   Pneumonia due to COVID-19 virus   Acute respiratory failure with hypoxia ([place])   Dyslipidemia   Uncontrolled type 2 diabetes mellitus with hyperglycemia ([place])  Sepsis present on admission, acute hypoxemic respiratory failure secondary to COVID-19 pneumonia: Completed course of IV remdesivir, continue steroid for total 10 days therapy. Received a dose of convalescent plasma and Actemra. Continues to require supplemental oxygen, continue chest physiotherapy and breathing exercises and continue to wean off the oxygen. If he requires ongoing oxygen, will need discharge on oxygen.  Uncontrolled type 2 diabetes mellitus with hyperglycemia: Hemoglobin A1c 7.9.  On Metformin at home.  Currently covered with insulin.  He is not able to do insulin at home, however is a steroid will finish in the next 2 days.  Hopefully he will do okay on diet exercise and Metformin.  DVT prophylaxis: Lovenox subcu Code Status: Full code Family Communication: None Disposition Plan: Home, possibly with supplemental oxygen when requirement is less than 3 L.   Consultants:   None  Procedures:   None  Antimicrobials:  Anti-infectives (From admission, onward)   Start     Dose/Rate Route Frequency Ordered Stop   10/16/19 1000  remdesivir 100 mg in sodium chloride 0.9 % 100 mL IVPB     100 mg 200 mL/hr over 30 Minutes Intravenous Daily 10/15/19 0723 10/19/19 1145   10/15/19 0730  remdesivir 200 mg in sodium chloride 0.9%  250 mL IVPB     200 mg 580 mL/hr over 30 Minutes Intravenous Once 10/15/19 0723 10/15/19 1023         Subjective: Patient seen and examined.  Has some dry cough otherwise no other complaints.  Still remains on 3 to 4 L of oxygen, mostly on 4 L on ambulation.  Remains afebrile. Very motivated to get up and walk.  Objective: Vitals:   10/21/19 1600 10/21/19 2057 10/22/19 0500 10/22/19 0823  BP: 112/61 110/67 106/72 108/73  Pulse: 69 68 65 74  Resp:  18 20 16   Temp: 98.5 F (36.9 C) 98.3 F (36.8 C) 98.4 F (36.9 C) (!) 97.3 F (36.3 C)  TempSrc: Oral Oral Oral Oral  SpO2: 90% 92% 94% (!) 86%  Weight:      Height:       No intake or output data in the 24 hours ending 10/22/19 1553 Filed Weights   10/15/19 0926 10/15/19 1445  Weight: 75.8 kg 70.7 kg    Examination:  General exam: Appears calm and comfortable, Respiratory system: Clear to auscultation. Respiratory effort normal.  No added sounds.  On 4 L oxygen. Cardiovascular system: S1 & S2 heard, RRR.  Gastrointestinal system: Abdomen is nondistended, soft and nontender.  Central nervous system: Alert and oriented. No focal neurological deficits. Extremities: Symmetric 5 x 5 power. Skin: No rashes, lesions or ulcers Psychiatry: Judgement and insight appear normal. Mood & affect appropriate.     Data Reviewed: I have personally reviewed following labs and imaging studies  CBC: Recent Labs  Lab 10/16/19 0440 10/17/19 0315 10/18/19 0323 10/19/19  0106 10/20/19 0222  WBC 13.3* 9.3 9.6 10.7* 10.4  NEUTROABS 12.1* 8.7* 8.8* 9.7* 9.5*  HGB 15.3 14.0 15.3 15.4 16.1  HCT 44.6 40.6 44.9 45.0 46.6  MCV 89.7 90.0 90.3 90.5 89.8  PLT 405* 382 441* 464* [id number]*   Basic Metabolic Panel: Recent Labs  Lab 10/16/19 0440 10/17/19 0315 10/18/19 0323 10/19/19 0106 10/20/19 0222  NA 137 136 137 136 134*  K 3.6 4.1 3.9 4.2 4.6  CL 101 102 102 102 102  CO2 21* 25 23 22 23   GLUCOSE 97 158* 132* 126* 159*  BUN 17 20 23*  20 22*  CREATININE 0.57* 0.40* 0.54* 0.51* 0.58*  CALCIUM 8.6* 8.8* 8.9 8.7* 8.6*  MG 1.8 2.1 2.0 2.1 2.1   GFR: Estimated Creatinine Clearance: 85.4 mL/min (A) (by C-G formula based on SCr of 0.58 mg/dL (L)). Liver Function Tests: Recent Labs  Lab 10/16/19 0440 10/17/19 0315 10/18/19 0323 10/19/19 0106 10/20/19 0222  AST 39 25 22 20 20   ALT 49* 37 37 36 38  ALKPHOS 75 81 83 77 76  BILITOT 1.3* 1.1 1.0 1.3* 0.9  PROT 7.3 7.1 7.0 6.9 6.6  ALBUMIN 3.0* 3.1* 3.0* 3.1* 2.9*   No results for input(s): LIPASE, AMYLASE in the last 168 hours. No results for input(s): AMMONIA in the last 168 hours. Coagulation Profile: No results for input(s): INR, PROTIME in the last 168 hours. Cardiac Enzymes: No results for input(s): CKTOTAL, CKMB, CKMBINDEX, TROPONINI in the last 168 hours. BNP (last 3 results) No results for input(s): PROBNP in the last 8760 hours. HbA1C: No results for input(s): HGBA1C in the last 72 hours. CBG: Recent Labs  Lab 10/21/19 1223 10/21/19 1718 10/21/19 2054 10/22/19 0813 10/22/19 1206  GLUCAP 160* 267* 235* 112* 163*   Lipid Profile: No results for input(s): CHOL, HDL, LDLCALC, TRIG, CHOLHDL, LDLDIRECT in the last 72 hours. Thyroid Function Tests: No results for input(s): TSH, T4TOTAL, FREET4, T3FREE, THYROIDAB in the last 72 hours. Anemia Panel: Recent Labs    10/20/19 0222  FERRITIN 837*   Sepsis Labs: No results for input(s): PROCALCITON, LATICACIDVEN in the last 168 hours.  Recent Results (from the past 240 hour(s))  Blood Culture (routine x 2)     Status: None   Collection Time: 10/15/19  4:58 AM   Specimen: BLOOD RIGHT FOREARM  Result Value Ref Range Status   Specimen Description BLOOD RIGHT FOREARM  Final   Special Requests   Final    BOTTLES DRAWN AEROBIC AND ANAEROBIC Blood Culture results may not be optimal due to an inadequate volume of blood received in culture bottles   Culture   Final    NO GROWTH 5 DAYS Performed at [facility], [place] [address] [place], [place] [address]    Report Status 10/20/2019 FINAL  Final  Blood Culture (routine x 2)     Status: None   Collection Time: 10/15/19  5:03 AM   Specimen: BLOOD RIGHT FOREARM  Result Value Ref Range Status   Specimen Description BLOOD RIGHT FOREARM  Final   Special Requests   Final    BOTTLES DRAWN AEROBIC AND ANAEROBIC Blood Culture results may not be optimal due to an inadequate volume of blood received in culture bottles   Culture   Final    NO GROWTH 5 DAYS Performed at [facility], [place] [address] [place], [place] [address]    Report Status 10/20/2019 FINAL  Final  Respiratory Panel by RT PCR (  Flu A&B, Covid) - Nasopharyngeal Swab     Status: Abnormal   Collection Time: 10/15/19  5:26 AM   Specimen: Nasopharyngeal Swab  Result Value Ref Range Status   SARS Coronavirus 2 by RT PCR POSITIVE (A) NEGATIVE Final    Comment: RESULT CALLED TO, READ BACK BY AND VERIFIED WITH: RN MORRIS, TAYLOR [place] [id number] FCP (NOTE) SARS-CoV-2 target nucleic acids are DETECTED. SARS-CoV-2 RNA is generally detectable in upper respiratory specimens  during the acute phase of infection. Positive results are indicative of the presence of the identified virus, but do not rule out bacterial infection or co-infection with other pathogens not detected by the test. Clinical correlation with patient history and other diagnostic information is necessary to determine patient infection status. The expected result is Negative. Fact Sheet for Patients:  PinkCheek.be Fact Sheet for Healthcare Providers: GravelBags.it This test is not yet approved or cleared by the [place] FDA and  has been authorized for detection and/or diagnosis of SARS-CoV-2 by FDA under an Emergency Use Authorization (EUA).  This EUA will remain in effect (meaning this test can be used) for  the duration of  the COVID-19 declaration under  Section 564(b)(1) of the Act, 21 U.S.C. section 360bbb-3(b)(1), unless the authorization is terminated or revoked sooner.    Influenza A by PCR NEGATIVE NEGATIVE Final   Influenza B by PCR NEGATIVE NEGATIVE Final    Comment: (NOTE) The Xpert Xpress SARS-CoV-2/FLU/RSV assay is intended as an aid in  the diagnosis of influenza from Nasopharyngeal swab specimens and  should not be used as a sole basis for treatment. Nasal washings and  aspirates are unacceptable for Xpert Xpress SARS-CoV-2/FLU/RSV  testing. Fact Sheet for Patients: PinkCheek.be Fact Sheet for Healthcare Providers: GravelBags.it This test is not yet approved or cleared by the [place] FDA and  has been authorized for detection and/or diagnosis of SARS-CoV-2 by  FDA under an Emergency Use Authorization (EUA). This EUA will remain  in effect (meaning this test can be used) for the duration of the  Covid-19 declaration under Section 564(b)(1) of the Act, 21  U.S.C. section 360bbb-3(b)(1), unless the authorization is  terminated or revoked. Performed at [facility], [place] [address] [place], [place] [address]   Culture, sputum-assessment     Status: None   Collection Time: 10/15/19  3:18 PM   Specimen: Expectorated Sputum  Result Value Ref Range Status   Specimen Description EXPECTORATED SPUTUM  Final   Special Requests NONE  Final   Sputum evaluation   Final    Sputum specimen not acceptable for testing.  Please recollect.   Performed at [facility], [place] [address] [place], [place] [address]    Report Status 10/15/2019 FINAL  Final         Radiology Studies: No results found.      Scheduled Meds: . albuterol  2 puff Inhalation Q6H  . vitamin C  500 mg Oral Daily  . dexamethasone  6 mg Oral Daily  . enoxaparin (LOVENOX) injection  40 mg Subcutaneous Q24H  . famotidine  20 mg Oral BID  . influenza vac split  quadrivalent PF  0.5 mL Intramuscular Tomorrow-1000  . insulin aspart  0-15 Units Subcutaneous TID WC  . insulin aspart  0-5 Units Subcutaneous QHS  . insulin aspart  6 Units Subcutaneous TID WC  . insulin detemir  5 Units Subcutaneous QHS  . metoprolol tartrate  25 mg Oral BID  . zinc sulfate  220  mg Oral Daily   Continuous Infusions:   LOS: 7 days    Time spent: 25 minutes    Barb Merino, MD [facility] Pager [phone]

## 2019-10-23 LAB — GLUCOSE, CAPILLARY
Glucose-Capillary: 232 mg/dL — ABNORMAL HIGH (ref 70–99)
Glucose-Capillary: 111 mg/dL — ABNORMAL HIGH (ref 70–99)
Glucose-Capillary: 157 mg/dL — ABNORMAL HIGH (ref 70–99)
Glucose-Capillary: 173 mg/dL — ABNORMAL HIGH (ref 70–99)

## 2019-10-23 NOTE — Progress Notes (Signed)
Pt needs:  2L via [place] at rest  4L via [place] when ambulating   To maintain sats at >90%

## 2019-10-23 NOTE — Discharge Summary (Signed)
Physician Discharge Summary  Walter Phillips [id number] DOB: 10-24-1961 DOA: 10/15/2019  PCP: Patient, No Pcp Per  Admit date: 10/15/2019 Discharge date: 10/23/2019  Admitted From: Home. Disposition: Home.  Recommendations for Outpatient Follow-up:  1. Follow up with PCP in 1-2 weeks   Home Health: Not applicable Equipment/Devices: Portable oxygen  Discharge Condition: Stable CODE STATUS: Full code Diet recommendation: Low-carb diet  Discharge summary: 58 year old gentleman with history of type 2 diabetes on Metformin, hyperlipidemia presented to the emergency room with complaining of shortness of breath.  In the emergency room he was tachypneic, 88% on room air, initially placed on nonrebreather.  He was admitted to the hospital with COVID-19 pneumonia.  Acute hypoxemic respiratory failure secondary to COVID-19 pneumonia: He completed 5 days of remdesivir therapy, he was started on steroid as outpatient, finished about 10 days of steroid therapy.  Patient also received a dose of convalescent plasma and dose of Actemra on admission.  His clinical status has improved, however he still requires supplemental oxygen. Has improved from acute phase of infection, however remains persistently hypoxic.  Patient will need to go home on oxygen and follow-up outpatient for subsequent weaning off the oxygen.  His blood sugars are elevated, hemoglobin A1c 7.9.  He is uncomfortable using insulin at home.  Since now he is going to stop further steroids, he will just resume Metformin and control his diet.  Discharge Diagnoses:  Principal Problem:   Sepsis ([place]) Active Problems:   Pneumonia due to COVID-19 virus   Acute respiratory failure with hypoxia (HCC)   Dyslipidemia   Uncontrolled type 2 diabetes mellitus with hyperglycemia [facility])    Discharge Instructions  Discharge Instructions    Call MD for:  difficulty breathing, headache or visual disturbances   Complete by: As directed    Call  MD for:  temperature >100.4   Complete by: As directed    Diet Carb Modified   Complete by: As directed    Discharge instructions   Complete by: As directed    Use over the counter cough medicine.   Increase activity slowly   Complete by: As directed    MyChart COVID-19 home monitoring program   Complete by: Oct 23, 2019    Is the patient willing to use the [place] for home monitoring?: Yes   Temperature monitoring   Complete by: Oct 23, 2019    After how many days would you like to receive a notification of this patient's flowsheet entries?: 1     Allergies as of 10/23/2019   No Known Allergies     Medication List    STOP taking these medications   dexamethasone 1 MG tablet Commonly known as: DECADRON     TAKE these medications   benzonatate 200 MG capsule Commonly known as: TESSALON Take 200 mg by mouth 3 (three) times daily as needed for cough.   betamethasone dipropionate 0.05 % cream Apply 1 application topically 2 (two) times daily.   Cough DM 30 MG/5ML liquid Generic drug: dextromethorphan Take 30 mg by mouth as needed for cough.   fenofibrate 160 MG tablet Take 160 mg by mouth daily.   metFORMIN 500 MG tablet Commonly known as: GLUCOPHAGE Take 500 mg by mouth daily with breakfast.      Follow-up Information    [facility] Follow up.   Why: You are scheduled for a telephonic follow up hospital call on Thursday, November 06, 2019 at 3:10p Please be available for this call. Should  you have to reschedule please call [phone] to reschedule Contact information: [address] [place] [phone]         No Known Allergies  Consultations:  None   Procedures/Studies: DG Chest Port 1 View  Result Date: 10/15/2019 CLINICAL DATA:  Shortness of breath.  COVID-19 positive. EXAM: PORTABLE CHEST 1 VIEW COMPARISON:  Radiograph 03/14/2007 FINDINGS: Low lung volumes. Prominent heterogeneous  opacities throughout both lungs, basilar predominant. Mild background fine interstitial opacities. Heart is normal in size. Normal mediastinal contours. No pleural fluid or pneumothorax. Overlying artifact projects over the chest. IMPRESSION: Low lung volumes with prominent heterogeneous opacities throughout both lungs, consistent with pneumonia, pattern typical of COVID-19 . Background fine interstitial opacities which may represent emphysema. Electronically Signed   By: Keith Rake M.D.   On: 10/15/2019 05:21       Subjective: Patient was seen and examined.  No overnight events.  He has some cough.  Able to walk around, however needs additional oxygen.   Discharge Exam: Vitals:   10/23/19 0400 10/23/19 0939  BP: 107/75 103/67  Pulse: 64 92  Resp: 16   Temp: 97.9 F (36.6 C)   SpO2: 95% 91%   Vitals:   10/22/19 1630 10/22/19 1934 10/23/19 0400 10/23/19 0939  BP: 109/66 114/70 107/75 103/67  Pulse: (!) 59 81 64 92  Resp: 16 18 16    Temp: 97.8 F (36.6 C) 97.8 F (36.6 C) 97.9 F (36.6 C)   TempSrc: Oral Oral Oral   SpO2: 95% 92% 95% 91%  Weight:      Height:        General: Pt is alert, awake, not in acute distress, resting on 2 L oxygen. Cardiovascular: RRR, S1/S2 +, no rubs, no gallops Respiratory: CTA bilaterally, no wheezing, no rhonchi Abdominal: Soft, NT, ND, bowel sounds + Extremities: no edema, no cyanosis    The results of significant diagnostics from this hospitalization (including imaging, microbiology, ancillary and laboratory) are listed below for reference.     Microbiology: Recent Results (from the past 240 hour(s))  Blood Culture (routine x 2)     Status: None   Collection Time: 10/15/19  4:58 AM   Specimen: BLOOD RIGHT FOREARM  Result Value Ref Range Status   Specimen Description BLOOD RIGHT FOREARM  Final   Special Requests   Final    BOTTLES DRAWN AEROBIC AND ANAEROBIC Blood Culture results may not be optimal due to an inadequate volume of  blood received in culture bottles   Culture   Final    NO GROWTH 5 DAYS Performed at [facility], [place] [address] [place], [place] [address]    Report Status 10/20/2019 FINAL  Final  Blood Culture (routine x 2)     Status: None   Collection Time: 10/15/19  5:03 AM   Specimen: BLOOD RIGHT FOREARM  Result Value Ref Range Status   Specimen Description BLOOD RIGHT FOREARM  Final   Special Requests   Final    BOTTLES DRAWN AEROBIC AND ANAEROBIC Blood Culture results may not be optimal due to an inadequate volume of blood received in culture bottles   Culture   Final    NO GROWTH 5 DAYS Performed at [facility], [place] [address], [place], [place] [address]    Report Status 10/20/2019 FINAL  Final  Respiratory Panel by RT PCR (Flu A&B, Covid) - Nasopharyngeal Swab     Status: Abnormal   Collection Time: 10/15/19  5:26  AM   Specimen: Nasopharyngeal Swab  Result Value Ref Range Status   SARS Coronavirus 2 by RT PCR POSITIVE (A) NEGATIVE Final    Comment: RESULT CALLED TO, READ BACK BY AND VERIFIED WITH: RN MORRIS, TAYLOR [place] [id number] FCP (NOTE) SARS-CoV-2 target nucleic acids are DETECTED. SARS-CoV-2 RNA is generally detectable in upper respiratory specimens  during the acute phase of infection. Positive results are indicative of the presence of the identified virus, but do not rule out bacterial infection or co-infection with other pathogens not detected by the test. Clinical correlation with patient history and other diagnostic information is necessary to determine patient infection status. The expected result is Negative. Fact Sheet for Patients:  PinkCheek.be Fact Sheet for Healthcare Providers: GravelBags.it This test is not yet approved or cleared by the [place] FDA and  has been authorized for detection and/or diagnosis of SARS-CoV-2 by FDA under an Emergency Use Authorization (EUA).  This EUA will remain  in effect (meaning this test can be used) for  the duration of  the COVID-19 declaration under Section 564(b)(1) of the Act, 21 U.S.C. section 360bbb-3(b)(1), unless the authorization is terminated or revoked sooner.    Influenza A by PCR NEGATIVE NEGATIVE Final   Influenza B by PCR NEGATIVE NEGATIVE Final    Comment: (NOTE) The Xpert Xpress SARS-CoV-2/FLU/RSV assay is intended as an aid in  the diagnosis of influenza from Nasopharyngeal swab specimens and  should not be used as a sole basis for treatment. Nasal washings and  aspirates are unacceptable for Xpert Xpress SARS-CoV-2/FLU/RSV  testing. Fact Sheet for Patients: PinkCheek.be Fact Sheet for Healthcare Providers: GravelBags.it This test is not yet approved or cleared by the [place] FDA and  has been authorized for detection and/or diagnosis of SARS-CoV-2 by  FDA under an Emergency Use Authorization (EUA). This EUA will remain  in effect (meaning this test can be used) for the duration of the  Covid-19 declaration under Section 564(b)(1) of the Act, 21  U.S.C. section 360bbb-3(b)(1), unless the authorization is  terminated or revoked. Performed at [facility], [place] [address], [place], [place] [address]   Culture, sputum-assessment     Status: None   Collection Time: 10/15/19  3:18 PM   Specimen: Expectorated Sputum  Result Value Ref Range Status   Specimen Description EXPECTORATED SPUTUM  Final   Special Requests NONE  Final   Sputum evaluation   Final    Sputum specimen not acceptable for testing.  Please recollect.   Performed at [facility], [place] [address] [place], [place] [address]    Report Status 10/15/2019 FINAL  Final     Labs: BNP (last 3 results) Recent Labs    10/15/19 0521  BNP [id number]   Basic Metabolic Panel: Recent Labs  Lab 10/17/19 0315 10/18/19 0323 10/19/19 0106 10/20/19 0222  NA 136 137 136 134*  K  4.1 3.9 4.2 4.6  CL 102 102 102 102  CO2 25 23 22 23   GLUCOSE 158* 132* 126* 159*  BUN 20 23* 20 22*  CREATININE 0.40* 0.54* 0.51* 0.58*  CALCIUM 8.8* 8.9 8.7* 8.6*  MG 2.1 2.0 2.1 2.1   Liver Function Tests: Recent Labs  Lab 10/17/19 0315 10/18/19 0323 10/19/19 0106 10/20/19 0222  AST 25 22 20 20   ALT 37 37 36 38  ALKPHOS 81 83 77 76  BILITOT 1.1 1.0 1.3* 0.9  PROT 7.1 7.0 6.9 6.6  ALBUMIN 3.1* 3.0* 3.1* 2.9*   No  results for input(s): LIPASE, AMYLASE in the last 168 hours. No results for input(s): AMMONIA in the last 168 hours. CBC: Recent Labs  Lab 10/17/19 0315 10/18/19 0323 10/19/19 0106 10/20/19 0222  WBC 9.3 9.6 10.7* 10.4  NEUTROABS 8.7* 8.8* 9.7* 9.5*  HGB 14.0 15.3 15.4 16.1  HCT 40.6 44.9 45.0 46.6  MCV 90.0 90.3 90.5 89.8  PLT 382 441* 464* 401*   Cardiac Enzymes: No results for input(s): CKTOTAL, CKMB, CKMBINDEX, TROPONINI in the last 168 hours. BNP: Invalid input(s): POCBNP CBG: Recent Labs  Lab 10/22/19 0813 10/22/19 1206 10/22/19 1618 10/22/19 2111 10/23/19 0836  GLUCAP 112* 163* 216* 232* 111*   D-Dimer Recent Labs    10/21/19 1015 10/22/19 0000  DDIMER 3.61* 3.18*   Hgb A1c No results for input(s): HGBA1C in the last 72 hours. Lipid Profile No results for input(s): CHOL, HDL, LDLCALC, TRIG, CHOLHDL, LDLDIRECT in the last 72 hours. Thyroid function studies No results for input(s): TSH, T4TOTAL, T3FREE, THYROIDAB in the last 72 hours.  Invalid input(s): FREET3 Anemia work up No results for input(s): VITAMINB12, FOLATE, FERRITIN, TIBC, IRON, RETICCTPCT in the last 72 hours. Urinalysis No results found for: COLORURINE, APPEARANCEUR, [place], [place], [place], [place], [place], [place], PROTEINUR, UROBILINOGEN, NITRITE, LEUKOCYTESUR Sepsis Labs Invalid input(s): PROCALCITONIN,  WBC,  LACTICIDVEN Microbiology Recent Results (from the past 240 hour(s))  Blood Culture (routine x 2)     Status: None   Collection Time: 10/15/19   4:58 AM   Specimen: BLOOD RIGHT FOREARM  Result Value Ref Range Status   Specimen Description BLOOD RIGHT FOREARM  Final   Special Requests   Final    BOTTLES DRAWN AEROBIC AND ANAEROBIC Blood Culture results may not be optimal due to an inadequate volume of blood received in culture bottles   Culture   Final    NO GROWTH 5 DAYS Performed at [facility], [place] [address] [place], [place] [address]    Report Status 10/20/2019 FINAL  Final  Blood Culture (routine x 2)     Status: None   Collection Time: 10/15/19  5:03 AM   Specimen: BLOOD RIGHT FOREARM  Result Value Ref Range Status   Specimen Description BLOOD RIGHT FOREARM  Final   Special Requests   Final    BOTTLES DRAWN AEROBIC AND ANAEROBIC Blood Culture results may not be optimal due to an inadequate volume of blood received in culture bottles   Culture   Final    NO GROWTH 5 DAYS Performed at [facility], [place] [address] [place], [place] [address]    Report Status 10/20/2019 FINAL  Final  Respiratory Panel by RT PCR (Flu A&B, Covid) - Nasopharyngeal Swab     Status: Abnormal   Collection Time: 10/15/19  5:26 AM   Specimen: Nasopharyngeal Swab  Result Value Ref Range Status   SARS Coronavirus 2 by RT PCR POSITIVE (A) NEGATIVE Final    Comment: RESULT CALLED TO, READ BACK BY AND VERIFIED WITH: RN MORRIS, TAYLOR [place] [id number] FCP (NOTE) SARS-CoV-2 target nucleic acids are DETECTED. SARS-CoV-2 RNA is generally detectable in upper respiratory specimens  during the acute phase of infection. Positive results are indicative of the presence of the identified virus, but do not rule out bacterial infection or co-infection with other pathogens not detected by the test. Clinical correlation with patient history and other diagnostic information is necessary to determine patient infection status. The expected result is Negative. Fact Sheet for Patients:  PinkCheek.be Fact Sheet for Healthcare  Providers: GravelBags.it This test is not yet approved or cleared by the [place] and  has been authorized for detection and/or diagnosis of SARS-CoV-2 by FDA under an Emergency Use Authorization (EUA).  This EUA will remain in effect (meaning this test can be used) for  the duration of  the COVID-19 declaration under Section 564(b)(1) of the Act, 21 U.S.C. section 360bbb-3(b)(1), unless the authorization is terminated or revoked sooner.    Influenza A by PCR NEGATIVE NEGATIVE Final   Influenza B by PCR NEGATIVE NEGATIVE Final    Comment: (NOTE) The Xpert Xpress SARS-CoV-2/FLU/RSV assay is intended as an aid in  the diagnosis of influenza from Nasopharyngeal swab specimens and  should not be used as a sole basis for treatment. Nasal washings and  aspirates are unacceptable for Xpert Xpress SARS-CoV-2/FLU/RSV  testing. Fact Sheet for Patients: PinkCheek.be Fact Sheet for Healthcare Providers: GravelBags.it This test is not yet approved or cleared by the [place] FDA and  has been authorized for detection and/or diagnosis of SARS-CoV-2 by  FDA under an Emergency Use Authorization (EUA). This EUA will remain  in effect (meaning this test can be used) for the duration of the  Covid-19 declaration under Section 564(b)(1) of the Act, 21  U.S.C. section 360bbb-3(b)(1), unless the authorization is  terminated or revoked. Performed at [facility], [place] [address], [place], [place] [address]   Culture, sputum-assessment     Status: None   Collection Time: 10/15/19  3:18 PM   Specimen: Expectorated Sputum  Result Value Ref Range Status   Specimen Description EXPECTORATED SPUTUM  Final   Special Requests NONE  Final   Sputum evaluation   Final    Sputum specimen not acceptable for testing.  Please recollect.   Performed at [facility], [place] [address],  [place], [place] [address]    Report Status 10/15/2019 FINAL  Final     Time coordinating discharge: 40 minutes  SIGNED:   Barb Merino, MD  [facility] 10/23/2019, 10:32 AM

## 2019-10-23 NOTE — Plan of Care (Signed)
  Problem: Education: Goal: Knowledge of risk factors and measures for prevention of condition will improve Outcome: Progressing   Problem: Coping: Goal: Psychosocial and spiritual needs will be supported Outcome: Progressing   Problem: Respiratory: Goal: Will maintain a patent airway Outcome: Progressing   Problem: Education: Goal: Knowledge of General Education information will improve Description: Including pain rating scale, medication(s)/side effects and non-pharmacologic comfort measures Outcome: Progressing   Problem: Health Behavior/Discharge Planning: Goal: Ability to manage health-related needs will improve Outcome: Progressing   Problem: Clinical Measurements: Goal: Ability to maintain clinical measurements within normal limits will improve Outcome: Progressing Goal: Will remain free from infection Outcome: Progressing Goal: Diagnostic test results will improve Outcome: Progressing Goal: Respiratory complications will improve Outcome: Progressing Goal: Cardiovascular complication will be avoided Outcome: Progressing   Problem: Activity: Goal: Risk for activity intolerance will decrease Outcome: Progressing   Problem: Nutrition: Goal: Adequate nutrition will be maintained Outcome: Progressing   Problem: Coping: Goal: Level of anxiety will decrease Outcome: Progressing   Problem: Elimination: Goal: Will not experience complications related to bowel motility Outcome: Progressing Goal: Will not experience complications related to urinary retention Outcome: Progressing   Problem: Pain Managment: Goal: General experience of comfort will improve Outcome: Progressing   Problem: Safety: Goal: Ability to remain free from injury will improve Outcome: Progressing   Problem: Skin Integrity: Goal: Risk for impaired skin integrity will decrease Outcome: Progressing   

## 2019-10-23 NOTE — TOC Transition Note (Signed)
Transition of Care [facility]) - CM/SW Discharge Note   Patient Details  Name: Walter Phillips MRN: [id number] Date of Birth: Apr 29, 1961  [facility] CM/SW Contact:  Boneta Lucks, RN Phone Number: 10/23/2019, 11:34 AM   Clinical Narrative:   Patient discharging home today. MD called and ordered Home Oxygen.  Learta Codding with [employer] has no tanks.  Called [employer] with [employer]. She took the referral, will update CM when home oxygen has been delivered to the home. Advised to use Administrator, sports.  AC is taking 2 tank to the room.  CM updated RN.     Final next level of care: Home/Self Care Barriers to Discharge: Barriers Resolved   Patient Goals and CMS Choice Patient states their goals for this hospitalization and ongoing recovery are:: to return home.   Choice offered to / list presented to : NA(first available/ Holiday)  Discharge Placement                  Name of family member notified: Wife Patient and family notified of of transfer: 10/23/19  Discharge Plan and Services   Discharge Planning Services: CM Consult            DME Arranged: Oxygen   Date DME Agency Contacted: 10/23/19 Time DME Agency Contacted: [phone]

## 2019-10-23 NOTE — Discharge Instructions (Signed)
Person Under Monitoring Name: Walter Phillips  Location: [address] [place] [address]   Infection Prevention Recommendations for Individuals Confirmed to have, or Being Evaluated for, 2019 Novel Coronavirus (COVID-19) Infection Who Receive Care at Home  Individuals who are confirmed to have, or are being evaluated for, COVID-19 should follow the prevention steps below until a healthcare provider or local or state health department says they can return to normal activities.  Stay home except to get medical care You should restrict activities outside your home, except for getting medical care. Do not go to work, school, or public areas, and do not use public transportation or taxis.  Call ahead before visiting your doctor Before your medical appointment, call the healthcare provider and tell them that you have, or are being evaluated for, COVID-19 infection. This will help the healthcare provider's office take steps to keep other people from getting infected. Ask your healthcare provider to call the local or state health department.  Monitor your symptoms Seek prompt medical attention if your illness is worsening (e.g., difficulty breathing). Before going to your medical appointment, call the healthcare provider and tell them that you have, or are being evaluated for, COVID-19 infection. Ask your healthcare provider to call the local or state health department.  Wear a facemask You should wear a facemask that covers your nose and mouth when you are in the same room with other people and when you visit a healthcare provider. People who live with or visit you should also wear a facemask while they are in the same room with you.  Separate yourself from other people in your home As much as possible, you should stay in a different room from other people in your home. Also, you should use a separate bathroom, if available.  Avoid sharing household items You should not  share dishes, drinking glasses, cups, eating utensils, towels, bedding, or other items with other people in your home. After using these items, you should wash them thoroughly with soap and water.  Cover your coughs and sneezes Cover your mouth and nose with a tissue when you cough or sneeze, or you can cough or sneeze into your sleeve. Throw used tissues in a lined trash can, and immediately wash your hands with soap and water for at least 20 seconds or use an alcohol-based hand rub.  Wash your [employer] your hands often and thoroughly with soap and water for at least 20 seconds. You can use an alcohol-based hand sanitizer if soap and water are not available and if your hands are not visibly dirty. Avoid touching your eyes, nose, and mouth with unwashed hands.   Prevention Steps for Caregivers and Household Members of Individuals Confirmed to have, or Being Evaluated for, COVID-19 Infection Being Cared for in the Home  If you live with, or provide care at home for, a person confirmed to have, or being evaluated for, COVID-19 infection please follow these guidelines to prevent infection:  Follow healthcare provider's instructions Make sure that you understand and can help the patient follow any healthcare provider instructions for all care.  Provide for the patient's basic needs You should help the patient with basic needs in the home and provide support for getting groceries, prescriptions, and other personal needs.  Monitor the patient's symptoms If they are getting sicker, call his or her medical provider and tell them that the patient has, or is being evaluated for, COVID-19 infection. This will help the healthcare provider's office take  steps to keep other people from getting infected. Ask the healthcare provider to call the local or state health department.  Limit the number of people who have contact with the patient  If possible, have only one caregiver for the  patient.  Other household members should stay in another home or place of residence. If this is not possible, they should stay  in another room, or be separated from the patient as much as possible. Use a separate bathroom, if available.  Restrict visitors who do not have an essential need to be in the home.  Keep older adults, very young children, and other sick people away from the patient Keep older adults, very young children, and those who have compromised immune systems or chronic health conditions away from the patient. This includes people with chronic heart, lung, or kidney conditions, diabetes, and cancer.  Ensure good ventilation Make sure that shared spaces in the home have good air flow, such as from an air conditioner or an opened window, weather permitting.  Wash your hands often  Wash your hands often and thoroughly with soap and water for at least 20 seconds. You can use an alcohol based hand sanitizer if soap and water are not available and if your hands are not visibly dirty.  Avoid touching your eyes, nose, and mouth with unwashed hands.  Use disposable paper towels to dry your hands. If not available, use dedicated cloth towels and replace them when they become wet.  Wear a facemask and gloves  Wear a disposable facemask at all times in the room and gloves when you touch or have contact with the patient's blood, body fluids, and/or secretions or excretions, such as sweat, saliva, sputum, nasal mucus, vomit, urine, or feces.  Ensure the mask fits over your nose and mouth tightly, and do not touch it during use.  Throw out disposable facemasks and gloves after using them. Do not reuse.  Wash your hands immediately after removing your facemask and gloves.  If your personal clothing becomes contaminated, carefully remove clothing and launder. Wash your hands after handling contaminated clothing.  Place all used disposable facemasks, gloves, and other waste in a lined  container before disposing them with other household waste.  Remove gloves and wash your hands immediately after handling these items.  Do not share dishes, glasses, or other household items with the patient  Avoid sharing household items. You should not share dishes, drinking glasses, cups, eating utensils, towels, bedding, or other items with a patient who is confirmed to have, or being evaluated for, COVID-19 infection.  After the person uses these items, you should wash them thoroughly with soap and water.  Wash laundry thoroughly  Immediately remove and wash clothes or bedding that have blood, body fluids, and/or secretions or excretions, such as sweat, saliva, sputum, nasal mucus, vomit, urine, or feces, on them.  Wear gloves when handling laundry from the patient.  Read and follow directions on labels of laundry or clothing items and detergent. In general, wash and dry with the warmest temperatures recommended on the label.  Clean all areas the individual has used often  Clean all touchable surfaces, such as counters, tabletops, doorknobs, bathroom fixtures, toilets, phones, keyboards, tablets, and bedside tables, every day. Also, clean any surfaces that may have blood, body fluids, and/or secretions or excretions on them.  Wear gloves when cleaning surfaces the patient has come in contact with.  Use a diluted bleach solution (e.g., dilute bleach with 1 part bleach  and 10 parts water) or a household disinfectant with a label that says EPA-registered for coronaviruses. To make a bleach solution at home, add 1 tablespoon of bleach to 1 quart (4 cups) of water. For a larger supply, add  cup of bleach to 1 gallon (16 cups) of water.  Read labels of cleaning products and follow recommendations provided on product labels. Labels contain instructions for safe and effective use of the cleaning product including precautions you should take when applying the product, such as wearing gloves or  eye protection and making sure you have good ventilation during use of the product.  Remove gloves and wash hands immediately after cleaning.  Monitor yourself for signs and symptoms of illness Caregivers and household members are considered close contacts, should monitor their health, and will be asked to limit movement outside of the home to the extent possible. Follow the monitoring steps for close contacts listed on the symptom monitoring form.   ? If you have additional questions, contact your local health department or call the epidemiologist on call at [phone] (available 24/7). ? This guidance is subject to change. For the most up-to-date guidance from [facility], please refer to their website: [email or web address].taylor.biz/ COVID-19 La COVID-19 es una infeccin respiratoria causada por un virus llamado coronavirus tipo 2 causante del sndrome respiratorio agudo grave (SARS-CoV-2). La enfermedad tambin se conoce como enfermedad por coronavirus o nuevo coronavirus. En algunas personas, el virus puede no ocasionar sntomas. En otras, puede producir una infeccin grave. La infeccin puede empeorar rpidamente y causar complicaciones, como:  Neumona o infeccin en los pulmones.  Sndrome de dificultad respiratoria aguda o SDRA. Se trata de la acumulacin de lquido en los pulmones.  Insuficiencia respiratoria aguda. Se trata de una afeccin en la que no pasa suficiente oxgeno de los pulmones al cuerpo.  Sepsis o choque sptico. Se trata de una reaccin grave del cuerpo ante una infeccin.  Problemas de coagulacin.  Infecciones secundarias debido a bacterias u hongos. El virus que causa la COVID-19 es contagioso. Esto significa que puede transmitirse de [place] persona a otra a travs de las gotitas de saliva de la tos y de los estornudos (secreciones respiratorias). Cules son las causas? Esta enfermedad es causada por un virus.  Usted puede contagiarse con este virus:  Al aspirar las gotitas que una persona infectada elimina al toser o Brewing technologist.  Al tocar algo, como una mesa o el picaportes de [place], que estuvo expuesto al virus (contaminado) y luego tocarse la boca, nariz o los ojos. Qu incrementa el riesgo? Riesgo de infeccin Es ms probable que se infecte con este virus si:  Vive o viaja a una zona donde hay un brote de COVID-19.  Rodman Comp en contacto con una persona enferma que recientemente viaj a una zona con un brote de COVID-19.  Cuida o vive con una persona infectada con COVID-19. Riesgo de enfermedad grave Es ms probable que se enferme gravemente por el virus si:  Tiene 63aos o ms.  Tiene una enfermedad crnica que disminuye la capacidad del cuerpo para combatir las infecciones (immunocomprometido).  Vive en un hogar de ancianos o centro de atencin a Barrister's clerk.  Tiene una enfermedad prolongada (crnica), como las siguientes: ? Enfermedad pulmonar crnica, que incluye la enfermedad pulmonar obstructiva crnica o asma. ? Enfermedad cardaca. ? Diabetes. ? Enfermedad renal crnica. ? Enfermedad heptica.  Es obeso. Cules son los signos o sntomas? Los sntomas de esta afeccin pueden ser de leves a graves. Los sntomas pueden aparecer  en el trmino de [address] a [address] despus de haber estado expuesto al virus. Incluyen los siguientes:  Herculaneum.  Tos.  Dificultad para respirar.  Escalofros.  Dolores musculares.  Dolor de Investment banker, operational.  Prdida del gusto o Armed forces logistics/support/administrative officer. Algunas personas tambin pueden [employer], como nuseas, vmitos o diarrea. Es posible que otras personas no tengan sntomas de COVID-19. Cmo se diagnostica? Esta afeccin se puede diagnosticar en funcin de lo siguiente:  Sus signos y sntomas, especialmente si: ? Vive en una zona donde hay un brote de COVID-19. ? Viaj recientemente a una zona donde el virus es frecuente. ? Cuida o vive  con Ardelia Mems persona a quien se le diagnostic COVID-19.  Un examen fsico.  Anlisis de laboratorio que pueden incluir: ? Un hisopado nasal para tomar [place] de lquido de la nariz. ? Un hisopado de garganta para tomar Truddie Coco de lquido de la garganta. ? Una muestra de mucosidad de los pulmones (esputo). ? Anlisis de [place].  Los estudios de diagnstico por imgenes pueden incluir radiografas, exploracin por tomografa computarizada (TC) o ecografa. Cmo se trata? En este momento, no hay ningn medicamento para tratar la COVID-19. Los medicamentos para tratar otras enfermedades se usan a modo de ensayo para comprobar si son eficaces contra la COVID-19. El mdico le informar sobre las maneras de tratar los sntomas. En la [employer], la infeccin es leve y puede controlarse en el hogar con reposo, lquidos y medicamentos de [place]. El tratamiento para una infeccin grave suele realizarse en la unidad de cuidados intensivos (UCI) de un hospital. Puede incluir uno o ms de los siguientes. Estos tratamientos se administran hasta que los sntomas mejoran.  Recibir lquidos y [employer] a travs de una va intravenosa.  Oxgeno complementario. Para administrar oxgeno extra, se [place] un tubo en la Doran Durand, una mascarilla o una campana de oxgeno.  Colocarlo para que se recueste boca abajo (decbito prono). Esto facilita el ingreso de oxgeno a los pulmones.  Uso continuo de [place] de presin positiva de las vas areas (CPAP) o de presin positiva de las vas areas de dos niveles (BPAP). Este tratamiento utiliza una presin de aire leve para Theatre manager las vas respiratorias abiertas. Un tubo conectado a un motor administra oxgeno al cuerpo.  Respirador. Este tratamiento mueve el aire dentro y fuera de los pulmones mediante el uso de un tubo que se coloca en la trquea.  Traqueostoma. En este procedimiento se hace un orificio en el cuello para insertar un  tubo de respiracin.  Oxigenacin por membrana extracorprea (OMEC). En este procedimiento, los pulmones tienen la posibilidad de recuperarse al asumir las funciones del corazn y los pulmones. Suministra oxgeno al cuerpo y elimina el dixido de carbono. Siga estas instrucciones en su casa: Estilo de vida  Si est enfermo, qudese en su casa, excepto para obtener atencin mdica. El mdico le indicar cunto tiempo debe quedarse en casa. Llame al mdico antes de buscar atencin mdica.  Haga reposo en su casa como se lo haya indicado el mdico.  No consuma ningn producto que contenga nicotina o tabaco, como cigarrillos, cigarrillos electrnicos y tabaco de Higher education careers adviser. Si necesita ayuda para dejar de fumar, consulte al mdico.  Retome sus actividades normales como se lo haya indicado el mdico. Pregntele al mdico qu actividades son seguras para usted. Instrucciones generales  Use los medicamentos de venta libre y los recetados solamente como se lo haya indicado el mdico.  Beba suficiente lquido como para  mantener la orina de color amarillo plido.  Concurra a todas las visitas de seguimiento como se lo haya indicado el mdico. Esto es importante. Cmo se evita?  No hay ninguna vacuna que ayude a prevenir la infeccin por la COVID-19. Sin embargo, hay medidas que puede tomar para protegerse y Museum/gallery curator a Producer, television/film/video de este virus. Para protegerse:   No viaje a zonas donde la COVID-19 sea un riesgo. Las zonas donde se informa la presencia de la COVID-19 [place] con frecuencia. Para identificar las zonas de alto riesgo y las restricciones de viaje, consulte el sitio web de viajes de Garment/textile technologist for [employer] and Prevention Librarian, academic) (Centros para el Control y la Prevencin de Arboriculturist): FatFares.com.br  Si vive o debe viajar a una zona donde COVID-19 es un riesgo, tome precauciones para evitar infecciones. ? Aljese de [place]. ? Lvese las manos  frecuentemente con agua y [place]. Use desinfectante para manos con alcohol si no dispone de [place] y [place]. ? Evite tocarse la boca, la cara, los ojos o la [employer]. ? Evite salir de su casa, siga las indicaciones de su estado y [place] autoridades sanitarias locales. ? Si debe salir de su casa, use un barbijo de tela o una mascarilla facial. ? Desinfecte los objetos y las superficies que se tocan con frecuencia todos [place]. Pueden incluir:  Encimeras y [place].  Picaportes e interruptores de luz.  Lavabos, fregaderos y grifos.  Aparatos electrnicos tales como telfonos, controles remotos, teclados, computadoras y tabletas. Cmo proteger a los dems: Si tiene sntomas de la COVID-19, tome medidas para evitar que el virus se propague a Producer, television/film/video.  Si cree que tiene una infeccin por la COVID-19, comunquese de inmediato con su mdico. Informe al equipo de atencin mdica que cree que puede tener una infeccin por la COVID-19.  Qudese en su casa. Salga de su casa solo para buscar atencin mdica. No utilice el transporte pblico.  No viaje mientras est enfermo.  Lvese las manos frecuentemente con agua y [place]. Usar desinfectante para manos con alcohol si no dispone de [place] y [place].  Mantngase alejado de quienes vivan con usted. Permita que los miembros de la familia sanos cuiden a los nios y las [place], si es posible. Si tiene que cuidar a los nios o las mascotas, lvese las manos con frecuencia y use un barbijo. Si es posible, permanezca en su habitacin, separado de los dems. Utilice un bao diferente.  Asegrese de que todas las personas que viven en su casa se laven bien las manos y con frecuencia.  Tosa o estornude en un pauelo de papel o sobre su manga o codo. No tosa o estornude al aire ni se cubra la boca o la nariz con la [place].  Use un barbijo de tela o una mascarilla facial. Dnde buscar ms informacin  Centers for Disease  Control and Prevention (Centros para el Control y la Prevencin de Arboriculturist): PurpleGadgets.be  World Health Organization (Organizacin Mundial de la Salud): https://www.castaneda.info/ Comunquese con un mdico si:  Vive o ha viajado a una zona donde la COVID-19 es un riesgo y tiene sntomas de infeccin.  Ha tenido contacto con alguien que tiene COVID-19 y usted tiene sntomas de infeccin. Solicite ayuda de inmediato si:  Tiene dificultad para respirar.  Siente dolor u opresin en el pecho.  Experimenta confusin.  Gregory uas de los dedos y los labios de color [place].  Tiene dificultad para despertarse.  Los sntomas empeoran. Estos sntomas pueden representar un problema grave que constituye Engineer, maintenance (IT). No espere a ver si los sntomas desaparecen. Solicite atencin mdica de inmediato. Comunquese con el servicio de emergencias de su localidad (911 en los [place]). No conduzca por sus propios medios [employer] hospital. Informe al personal mdico de emergencias si cree que tiene COVID-19. Resumen  La COVID-19 es una infeccin respiratoria causada por un virus. Tambin se conoce como enfermedad por coronavirus o nuevo coronavirus. Puede causar infecciones graves, como neumona, sndrome de dificultad respiratoria aguda, insuficiencia respiratoria aguda o sepsis.  El virus que causa la COVID-19 es contagioso. Esto significa que puede transmitirse de [place] persona a otra a travs de las gotitas de saliva de la tos y de los estornudos.  Es ms probable que desarrolle una enfermedad grave si tiene 65 aos o ms, tiene un sistema inmunitario dbil, vive en un hogar de ancianos o tiene enfermedad crnica.  No hay ningn medicamento para tratar la COVID-19. El mdico le informar sobre las maneras de tratar los sntomas.  Tome medidas para protegerse y Museum/gallery curator a los Location manager las infecciones. Lvese las manos con frecuencia y  desinfecte los objetos y las superficies que se tocan con frecuencia todos [place]. Mantngase alejado de las personas que estn enfermas y use un barbijo si est enfermo. Esta informacin no tiene Marine scientist el consejo del mdico. Asegrese de hacerle al mdico cualquier pregunta que tenga. Document Released: 12/14/2018 Document Revised: 03/18/2019 Document Reviewed: 12/14/2018 Elsevier Patient Education  2020 [employer].

## 2019-10-23 NOTE — Treatment Plan (Signed)
  Oxygen saturation at rest on room air 87% Oxygen saturation at rest on 2 L oxygen 91% Oxygen saturation on ambulation on 4 L oxygen 91%  Patient will need ambulatory oxygen to maintain adequate oxygenation.

## 2019-11-04 ENCOUNTER — Other Ambulatory Visit: Payer: Self-pay | Admitting: Internal Medicine

## 2019-11-04 DIAGNOSIS — J1282 Pneumonia due to coronavirus disease 2019: Secondary | ICD-10-CM

## 2019-11-06 ENCOUNTER — Ambulatory Visit (INDEPENDENT_AMBULATORY_CARE_PROVIDER_SITE_OTHER): Payer: Self-pay | Admitting: Critical Care Medicine

## 2019-11-06 DIAGNOSIS — E1165 Type 2 diabetes mellitus with hyperglycemia: Secondary | ICD-10-CM

## 2019-11-06 DIAGNOSIS — E785 Hyperlipidemia, unspecified: Secondary | ICD-10-CM

## 2019-11-06 DIAGNOSIS — U071 COVID-19: Secondary | ICD-10-CM

## 2019-11-06 DIAGNOSIS — J9601 Acute respiratory failure with hypoxia: Secondary | ICD-10-CM

## 2019-11-06 DIAGNOSIS — J1282 Pneumonia due to coronavirus disease 2019: Secondary | ICD-10-CM

## 2019-11-06 MED ORDER — METFORMIN HCL 500 MG PO TABS: 500.0000 mg | ORAL_TABLET | Freq: Every day | ORAL | 2 refills | Status: DC

## 2019-11-06 MED ORDER — SIMETHICONE 80 MG PO CHEW: 80.0000 mg | CHEWABLE_TABLET | Freq: Four times a day (QID) | ORAL | 0 refills | Status: DC | PRN

## 2019-11-06 MED ORDER — SALINE SPRAY 0.65 % NA SOLN: 1.0000 | NASAL | 0 refills | Status: DC | PRN

## 2019-11-06 MED ORDER — PROMETHAZINE-DM 6.25-15 MG/5ML PO SYRP: 5.0000 mL | ORAL_SOLUTION | Freq: Four times a day (QID) | ORAL | 0 refills | Status: DC | PRN

## 2019-11-06 MED ORDER — OMEPRAZOLE 20 MG PO CPDR: 20.0000 mg | DELAYED_RELEASE_CAPSULE | Freq: Every day | ORAL | 4 refills | Status: DC

## 2019-11-06 NOTE — Progress Notes (Addendum)
Patient ID: Walter Phillips, male   DOB: 11-24-1960, 59 y.o.   MRN: [id number] Virtual Visit via Telephone Note  I connected with Walter Phillips on 11/06/19 at  3:30 PM EST by telephone and verified that I am speaking with the correct person using two identifiers.   Consent:  I discussed the limitations, risks, security and privacy concerns of performing an evaluation and management service by telephone and the availability of in person appointments. I also discussed with the patient that there may be a patient responsible charge related to this service. The patient expressed understanding and agreed to proceed.  Location of patient: The patient was at home  Location of provider: I was in my office  Persons participating in the televisit with the patient.   There was a Spanish interpreter Sol Blazing [phone] who was on the call    History of Present Illness: This is a telephone post hospital visit for this 59 year old male [place] who was admitted for Covid pneumonia between the 12th and 24 December.  He had significant hypoxemia and ultimately had to be discharged on oxygen 2 L rest 4 L exertion.  This is a post hospital visit.  Below is the discharge summary.  Admit date: 10/15/2019 Discharge date: 10/23/2019  Discharge summary: 59 year old gentleman with history of type 2 diabetes on Metformin, hyperlipidemia presented to the emergency room with complaining of shortness of breath.  In the emergency room he was tachypneic, 88% on room air, initially placed on nonrebreather.  He was admitted to the hospital with COVID-19 pneumonia.  Acute hypoxemic respiratory failure secondary to COVID-19 pneumonia: He completed 5 days of remdesivir therapy, he was started on steroid as outpatient, finished about 10 days of steroid therapy.  Patient also received a dose of convalescent plasma and dose of Actemra on admission.  His clinical status has improved, however he still requires supplemental oxygen. Has  improved from acute phase of infection, however remains persistently hypoxic.  Patient will need to go home on oxygen and follow-up outpatient for subsequent weaning off the oxygen.  His blood sugars are elevated, hemoglobin A1c 7.9.  He is uncomfortable using insulin at home.  Since now he is going to stop further steroids, he will just resume Metformin and control his diet.  Discharge Diagnoses:  Principal Problem:   Sepsis ([place]) Active Problems:   Pneumonia due to COVID-19 virus   Acute respiratory failure with hypoxia ([place])   Dyslipidemia   Uncontrolled type 2 diabetes mellitus with hyperglycemia ([place])  Note the patient was to get Metformin at discharge but apparently no prescription was issued to the patient.  He does have new onset diagnosed type 2 diabetes.  He does not have a glucometer at home as well.  The patient currently complains of being severely fatigued not having any appetite having excess gas and belching.  He wakes up with heartburn at night and is coughing at night.  He has nosebleeding from the oxygen in the nose.  He does have an albuterol inhaler he was given from the hospital but he has not been using this.  His saturations at home are 95 to 96% sitting at rest on 2 L when he walks on 4 L around the house he is 97%.  The patient is a lifelong never smoker.  He had diffuse bilateral infiltrates on his chest x-ray during the admission.  Constitutional:   No  weight loss, night sweats,  Fevers, chills, fatigue, lassitude. HEENT:    headaches,  Difficulty swallowing,  Tooth/dental problems,  Sore throat,                No sneezing, itching, ear ache, nasal congestion, post nasal drip,   CV:  No chest pain,  Orthopnea, PND, swelling in lower extremities, anasarca, dizziness, palpitations  GI  heartburn, indigestion, abdominal pain, nausea, vomiting,  loss of appetite  No diarrhea   Resp:  shortness of breath with exertion or at rest.  No excess mucus, no productive  cough,   non-productive cough,  No coughing up of blood.  No change in color of mucus.  No wheezing.  No chest wall deformity  Skin: no rash or lesions.  GU: no dysuria, change in color of urine, no urgency or frequency.  No flank pain.  MS:  No joint pain or swelling.  No decreased range of motion.  No back pain.  Psych:  No change in mood or affect. No depression or anxiety.  No memory loss.    Observations/Objective:   Assessment and Plan: #1 status post COVID-19 pneumonia with ongoing chronic hypoxic respiratory failure status post acute respiratory failure.  Plan will be for the patient to continue oxygen treatment as prescribed and will instruct the patient and have instructed the patient to use the albuterol inhaler 2 puffs every 6 hours on schedule.  A cough syrup will be prescribed as well.  #2 significant gas and reflux symptoms  Plan for this the patient will receive proton pump inhibitorAnd simethicone for the gas  #3 type 2 diabetes not treated currently  Plan will be for the patient to receive Metformin 500 mg twice daily and we will provide the patient a glucose meter when he comes into the office in a week.  There will also be follow-up labs done as well when he comes to the office Follow Up Instructions: Patient knows will be a follow-up office visit within the next week for in person exam   I discussed the assessment and treatment plan with the patient. The patient was provided an opportunity to ask questions and all were answered. The patient agreed with the plan and demonstrated an understanding of the instructions.   The patient was advised to call back or seek an in-person evaluation if the symptoms worsen or if the condition fails to improve as anticipated.  I provided 30 minutes of non-face-to-face time during this encounter  including  median intraservice time , review of notes, labs, imaging, medications  and explaining diagnosis and management to the patient  .    Asencion Noble, MD

## 2019-11-07 ENCOUNTER — Emergency Department (HOSPITAL_COMMUNITY): Payer: Self-pay

## 2019-11-07 ENCOUNTER — Encounter (HOSPITAL_COMMUNITY): Payer: Self-pay | Admitting: Emergency Medicine

## 2019-11-07 ENCOUNTER — Emergency Department (HOSPITAL_COMMUNITY)
Admission: EM | Admit: 2019-11-07 | Discharge: 2019-11-07 | Disposition: A | Discharge status: Home / Self Care | Payer: Self-pay | Attending: Emergency Medicine | Admitting: Emergency Medicine

## 2019-11-07 ENCOUNTER — Other Ambulatory Visit: Payer: Self-pay

## 2019-11-07 DIAGNOSIS — U071 COVID-19: Secondary | ICD-10-CM | POA: Insufficient documentation

## 2019-11-07 DIAGNOSIS — F17200 Nicotine dependence, unspecified, uncomplicated: Secondary | ICD-10-CM | POA: Insufficient documentation

## 2019-11-07 DIAGNOSIS — Z85028 Personal history of other malignant neoplasm of stomach: Secondary | ICD-10-CM | POA: Insufficient documentation

## 2019-11-07 DIAGNOSIS — E119 Type 2 diabetes mellitus without complications: Secondary | ICD-10-CM | POA: Insufficient documentation

## 2019-11-07 LAB — BASIC METABOLIC PANEL
Anion gap: 10 (ref 5–15)
BUN: 8 mg/dL (ref 6–20)
CO2: 23 mmol/L (ref 22–32)
Calcium: 8.6 mg/dL — ABNORMAL LOW (ref 8.9–10.3)
Chloride: 105 mmol/L (ref 98–111)
Creatinine, Ser: 0.31 mg/dL — ABNORMAL LOW (ref 0.61–1.24)
GFR calc Af Amer: >60 mL/min (ref >60)
GFR calc non Af Amer: >60 mL/min (ref >60)
Glucose, Bld: 112 mg/dL — ABNORMAL HIGH (ref 70–99)
Potassium: 3.6 mmol/L (ref 3.5–5.1)
Sodium: 138 mmol/L (ref 135–145)

## 2019-11-07 LAB — CBC
HCT: 43.3 % (ref 39.0–52.0)
Hemoglobin: 14 g/dL (ref 13.0–17.0)
MCH: 30.4 pg (ref 26.0–34.0)
MCHC: 32.3 g/dL (ref 30.0–36.0)
MCV: 93.9 fL (ref 80.0–100.0)
Platelets: 153 10*3/uL (ref 150–400)
RBC: 4.61 MIL/uL (ref 4.22–5.81)
RDW: 14.6 % (ref 11.5–15.5)
WBC: 6.8 10*3/uL (ref 4.0–10.5)
nRBC: 0 % (ref 0.0–0.2)

## 2019-11-07 MED ORDER — ALBUTEROL SULFATE HFA 108 (90 BASE) MCG/ACT IN AERS: 1.0000 | INHALATION_SPRAY | RESPIRATORY_TRACT | 1 refills | Status: DC | PRN

## 2019-11-07 MED ORDER — SODIUM CHLORIDE 0.9 % IV BOLUS
1000.0000 mL | Freq: Once | INTRAVENOUS | Status: AC
  Administered 2019-11-07: 1000 mL via INTRAVENOUS

## 2019-11-07 MED ORDER — ALBUTEROL SULFATE HFA 108 (90 BASE) MCG/ACT IN AERS
4.0000 | INHALATION_SPRAY | Freq: Once | RESPIRATORY_TRACT | Status: AC
  Administered 2019-11-07: 22:00:00 4 via RESPIRATORY_TRACT
  Filled 2019-11-07: qty 6.7

## 2019-11-07 NOTE — ED Triage Notes (Signed)
Per EMS, patient from home, had Covid in December, c/o continued weakness with SOB on exertion, worsening x5 days. 94% on RA. Patient is anxious. Has follow up with PCP scheduled for Monday.

## 2019-11-07 NOTE — Discharge Instructions (Addendum)
Take all your medicines your doctor prescribed.  And follow-up with them in the next couple weeks for recheck

## 2019-11-07 NOTE — ED Provider Notes (Signed)
[facility] DEPT Provider Note   CSN: [id number] Arrival date & time: 11/07/19  1556     History Chief Complaint  Patient presents with  . Weakness  . Shortness of Breath    Walter Phillips is a 59 y.o. male.  Patient complains of some shortness of breath and weakness.  He was hospitalized with Covid and has been at home with O2.  He spoke with his pulmonologist yesterday and he is supposed to continue albuterol along with he was given a protein pump inhibitor and Metformin.  The history is provided by the patient.  Weakness Associated symptoms: shortness of breath   Associated symptoms: no abdominal pain, no chest pain, no cough, no diarrhea, no frequency, no headaches and no seizures   Shortness of Breath Associated symptoms: no abdominal pain, no chest pain, no cough, no headaches and no rash        Past Medical History:  Diagnosis Date  . Cancer ([place])   . Diabetes mellitus, type II ([place])   . Dyslipidemia   . 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 Uncontrolled type 2 diabetes mellitus with hyperglycemia ([place]) 10/16/2019  . Pneumonia due to COVID-19 virus 10/15/2019  . Sepsis ([place]) 10/15/2019  . Acute respiratory failure with hypoxia ([place]) 10/15/2019  . Dyslipidemia 10/15/2019  . Ventral hernia 04/17/2014    Past Surgical History:  Procedure Laterality Date  . APPENDECTOMY    . LAPAROSCOPIC CHOLECYSTECTOMY    . LAPAROSCOPIC SIGMOID COLECTOMY    . VENTRAL HERNIA REPAIR  05/27/2014       Family History  Problem Relation Age of Onset  . Early death Mother        childbirth  . Cancer Father   . Stomach cancer Sister 52  . Stroke Sister   . Diabetes Daughter     Social History   Tobacco Use  . Smoking status: Current Some Day Smoker    Packs/day: 0.50  . Smokeless tobacco: Never Used  Substance Use Topics  . Alcohol use: Yes    Comment: occ  . Drug use: No    Home  Medications Prior to Admission medications   Medication Sig Start Date End Date Taking? Authorizing Provider  metFORMIN (GLUCOPHAGE) 500 MG tablet Take 1 tablet (500 mg total) by mouth daily with breakfast. 11/06/19  Yes Elsie Stain, MD  omeprazole (PRILOSEC) 20 MG capsule Take 1 capsule (20 mg total) by mouth daily. 11/06/19  Yes Elsie Stain, MD  promethazine-dextromethorphan (PROMETHAZINE-DM) 6.25-15 MG/5ML syrup Take 5 mLs by mouth 4 (four) times daily as needed for cough. 11/06/19  Yes Elsie Stain, MD  simethicone (GAS-X) 80 MG chewable tablet Chew 1 tablet (80 mg total) by mouth every 6 (six) hours as needed for flatulence. 11/06/19  Yes Elsie Stain, MD  sodium chloride (OCEAN) 0.65 % SOLN nasal spray Place 1 spray into both nostrils as needed for congestion. 11/06/19  Yes Elsie Stain, MD  albuterol (VENTOLIN HFA) 108 (90 Base) MCG/ACT inhaler Inhale 1-2 puffs into the lungs every 4 (four) hours as needed for wheezing or shortness of breath. 11/07/19   Milton Ferguson, MD    Allergies    Patient has no known allergies.  Review of Systems   Review of Systems  Constitutional: Negative for appetite change and fatigue.  HENT: Negative for congestion, ear discharge and sinus pressure.   Eyes: Negative for discharge.  Respiratory:  Positive for shortness of breath. Negative for cough.   Cardiovascular: Negative for chest pain.  Gastrointestinal: Negative for abdominal pain and diarrhea.  Genitourinary: Negative for frequency and hematuria.  Musculoskeletal: Negative for back pain.  Skin: Negative for rash.  Neurological: Positive for weakness. Negative for seizures and headaches.  Psychiatric/Behavioral: Negative for hallucinations.    Physical Exam Updated Vital Signs BP 138/80   Pulse (!) 102   Temp 98.7 F (37.1 C) (Oral)   Resp (!) 24   SpO2 94%   Physical Exam Vitals and nursing note reviewed.  Constitutional:      Appearance: He is well-developed.  HENT:      Head: Normocephalic.     Nose: Nose normal.  Eyes:     General: No scleral icterus.    Conjunctiva/sclera: Conjunctivae normal.  Neck:     Thyroid: No thyromegaly.  Cardiovascular:     Rate and Rhythm: Normal rate and regular rhythm.     Heart sounds: No murmur. No friction rub. No gallop.   Pulmonary:     Breath sounds: No stridor. No wheezing or rales.  Chest:     Chest wall: No tenderness.  Abdominal:     General: There is no distension.     Tenderness: There is no abdominal tenderness. There is no rebound.  Musculoskeletal:        General: Normal range of motion.     Cervical back: Neck supple.  Lymphadenopathy:     Cervical: No cervical adenopathy.  Skin:    Findings: No erythema or rash.  Neurological:     Mental Status: He is oriented to person, place, and time.     Motor: No abnormal muscle tone.     Coordination: Coordination normal.  Psychiatric:        Behavior: Behavior normal.     ED Results / Procedures / Treatments   Labs (all labs ordered are listed, but only abnormal results are displayed) Labs Reviewed  BASIC METABOLIC PANEL - Abnormal; Notable for the following components:      Result Value   Glucose, Bld 112 (*)    Creatinine, Ser 0.31 (*)    Calcium 8.6 (*)    All other components within normal limits  CBC    EKG None  Radiology DG Chest 2 View  Result Date: 11/07/2019 CLINICAL DATA:  Shortness of breath. EXAM: CHEST - 2 VIEW COMPARISON:  October 15, 2019 FINDINGS: Marked severity patchy bilateral infiltrates are seen. This is slightly more prominent within the mid left lung and bilateral lung bases and is increased in severity when compared to the prior study. There is no evidence of a pleural effusion or pneumothorax. The heart size and mediastinal contours are within normal limits. The visualized skeletal structures are unremarkable. Radiopaque surgical clips are seen overlying the right upper quadrant. IMPRESSION: Marked severity  patchy bilateral infiltrates, increased in severity when compared to the prior study dated October 15, 2019. Electronically Signed   By: Virgina Norfolk M.D.   On: 11/07/2019 17:01    Procedures Procedures (including critical care time)  Medications Ordered in ED Medications  albuterol (VENTOLIN HFA) 108 (90 Base) MCG/ACT inhaler 4 puff (4 puffs Inhalation Given 11/07/19 2133)  sodium chloride 0.9 % bolus 1,000 mL (1,000 mLs Intravenous New Bag/Given (Non-Interop) 11/07/19 2142)    ED Course  I have reviewed the triage vital signs and the nursing notes.  Pertinent labs & imaging results that were available during my care of the patient  were reviewed by me and considered in my medical decision making (see chart for details).    MDM Rules/Calculators/A&P                      Patient nontoxic with Covid infection.  His O2 sats have been 9495% on room air.  Patient was given albuterol inhaler and will continue to follow-up with his pulmonologist.  He will start the medicines pulmonologist suggested yesterday which was protonic and Glucophage Final Clinical Impression(s) / ED Diagnoses Final diagnoses:  COVID toes    Rx / DC Orders ED Discharge Orders         Ordered    albuterol (VENTOLIN HFA) 108 (90 Base) MCG/ACT inhaler  Every 4 hours PRN     11/07/19 2246           Milton Ferguson, MD 11/07/19 2250

## 2019-11-10 ENCOUNTER — Encounter: Payer: Self-pay | Admitting: Critical Care Medicine

## 2019-11-10 ENCOUNTER — Ambulatory Visit: Payer: Self-pay | Attending: Critical Care Medicine | Admitting: Critical Care Medicine

## 2019-11-10 VITALS — BP 141/90 | HR 80 | Temp 98.0°F | Wt 144.0 lb

## 2019-11-10 DIAGNOSIS — E1165 Type 2 diabetes mellitus with hyperglycemia: Secondary | ICD-10-CM

## 2019-11-10 DIAGNOSIS — Z1159 Encounter for screening for other viral diseases: Secondary | ICD-10-CM

## 2019-11-10 DIAGNOSIS — J1282 Pneumonia due to coronavirus disease 2019: Secondary | ICD-10-CM

## 2019-11-10 DIAGNOSIS — J9611 Chronic respiratory failure with hypoxia: Secondary | ICD-10-CM

## 2019-11-10 DIAGNOSIS — U071 COVID-19: Secondary | ICD-10-CM

## 2019-11-10 LAB — GLUCOSE, POCT (MANUAL RESULT ENTRY): POC Glucose: 123 mg/dl — AB (ref 70–99)

## 2019-11-10 MED ORDER — TRUE METRIX BLOOD GLUCOSE TEST VI STRP: ORAL_STRIP | 12 refills | Status: DC

## 2019-11-10 MED ORDER — PREDNISONE 10 MG PO TABS: ORAL_TABLET | ORAL | 0 refills | Status: DC

## 2019-11-10 MED ORDER — VITAMIN D 50 MCG (2000 UT) PO TABS: 2000.0000 [IU] | ORAL_TABLET | Freq: Every day | ORAL | 1 refills | Status: DC

## 2019-11-10 MED ORDER — TRUEPLUS LANCETS 28G MISC: 1 refills | Status: DC

## 2019-11-10 MED ORDER — VITAMIN C 500 MG PO CAPS: 1.0000 | ORAL_CAPSULE | Freq: Every day | ORAL | 1 refills | Status: DC

## 2019-11-10 MED ORDER — AZITHROMYCIN 250 MG PO TABS: 250.0000 mg | ORAL_TABLET | Freq: Every day | ORAL | 0 refills | Status: DC

## 2019-11-10 MED ORDER — TRUE METRIX METER W/DEVICE KIT: PACK | 0 refills | Status: AC

## 2019-11-10 MED ORDER — PREDNISONE 10 MG PO TABS: 10.0000 mg | ORAL_TABLET | Freq: Every day | ORAL | 0 refills | Status: DC

## 2019-11-10 MED ORDER — ZINC GLUCONATE 50 MG PO TABS: 50.0000 mg | ORAL_TABLET | Freq: Every day | ORAL | 1 refills | Status: DC

## 2019-11-10 MED ORDER — ADVAIR HFA 230-21 MCG/ACT IN AERO: 2.0000 | INHALATION_SPRAY | Freq: Two times a day (BID) | RESPIRATORY_TRACT | 12 refills | Status: DC

## 2019-11-10 MED FILL — predniSONE 10 MG TABS: 10 | 5 days supply | Qty: 20 | Fill #0

## 2019-11-10 MED FILL — TRUE METRIX TEST STRIP: 50 days supply | Qty: 100 | Fill #0

## 2019-11-10 MED FILL — ADVAIR HFA 230-21 MCG INH: 230-21 | 30 days supply | Qty: 12 | Fill #0

## 2019-11-10 MED FILL — AZITHROMYCIN 250 MG TABLET: 250 | 5 days supply | Qty: 6 | Fill #0

## 2019-11-10 MED FILL — TRUEplus LANCETS 28G MISC: 50 days supply | Qty: 100 | Fill #0

## 2019-11-10 MED FILL — !TRUE METRIX BLOOD GLUCOSE: 365 days supply | Qty: 1 | Fill #0

## 2019-11-10 NOTE — Patient Instructions (Addendum)
Begin prednisone take 4 tablets daily for 5 days  Begin azithromycin take 2 for 1 day then 1 daily till he bottle is empty  Begin Advair 2 inhalations twice daily  Use albuterol 2 inhalations 4 times daily as needed for shortness of breath  Stay on omeprazole daily for your acid and the Gas-X for your gas as prescribed  Stay on metformin daily  Glucose testing supplies were given  A CAT scan of your chest will be obtained to look at your blood vessels and lungs in more detail evaluating for blood clots  Labs today include metabolic panel blood counts hemoglobin A1c hepatitis C  Stay on oxygen 2 L with exertion and at night during sleep and you may be off oxygen when you are sitting and not doing any activity  Follow-up with Dr. Joya Gaskins in 3 weeks

## 2019-11-10 NOTE — Assessment & Plan Note (Signed)
As per Covid pneumonia assessment will continue oxygen therapy

## 2019-11-10 NOTE — Assessment & Plan Note (Addendum)
Chronic respiratory failure due to prior Covid pneumonia now with ongoing lung injury and hypoxemia  Plan here will be to continue oxygen 2 L exertion and during sleep and as needed during the day.  Note oximetry check today showed on room air saturation 94% at rest drops to 88% on room air with ambulating around the office goes back up to 96% with 2 L  We will also plan a pulse dose of prednisone, begin Advair 230 mcg at 2 inhalations twice daily, begin vitamin D and C supplementation and zinc supplementation.  We will also give azithromycin for 5 days and prescribe an albuterol inhaler as well  Because of the degree of hypoxemia I am concerned about blood clotting in the lung and will obtain a CT angiogram of the chest

## 2019-11-10 NOTE — Progress Notes (Signed)
Subjective:    Patient ID: Walter Phillips, male    DOB: [birth year], 59 y.o.   MRN: [id number]   59 year old male [place] who was admitted for Covid pneumonia between the 12th and 24 December.  He had significant hypoxemia and ultimately had to be discharged on oxygen 2 L rest 4 L exertion.  This is a post hospital visit.  Below is the discharge summary.  Admit date:10/15/2019 Discharge date:10/23/2019  Discharge summary: 59 year old gentleman with history of type 2 diabetes on Metformin, hyperlipidemia presented to the emergency room with complaining of shortness of breath. In the emergency room he was tachypneic, 88% on room air, initially placed on nonrebreather. He was admitted to the hospital with COVID-19 pneumonia.  Acute hypoxemic respiratory failure secondary to COVID-19 pneumonia: He completed 5 days of remdesivir therapy, he was started on steroid as outpatient, finished about 10 days of steroid therapy. Patient also received a dose of convalescent plasma and dose of Actemra on admission. His clinical status has improved, however he still requires supplemental oxygen. Has improved from acute phase of infection, however remains persistently hypoxic. Patient will need to go home on oxygen and follow-up outpatient for subsequent weaning off the oxygen.  His blood sugars are elevated, hemoglobin A1c 7.9. He is uncomfortable using insulin at home. Since now he is going to stop further steroids, he will just resume Metformin and control his diet.  Discharge Diagnoses: Principal Problem: Sepsis ([place]) Active Problems: Pneumonia due to COVID-19 virus Acute respiratory failure with hypoxia ([place]) Dyslipidemia Uncontrolled type 2 diabetes mellitus with hyperglycemia ([place])   11/10/2019 This is a return follow-up visit for this 59 year old male who was admitted in December for Covid pneumonia.  He had diffuse bilateral infiltrates and low lung volumes and required  oxygen therapy on discharge.  I did a telephone visit with him last week and our plan was to get him into the office today for further evaluation.  Nevertheless he went back to the emergency room on 8 January and they discharged him without any changes in plan of care except for the prescription of an albuterol inhaler.  His oxygen saturation on room air was normal at that visit.  Chest x-ray is repeated and still shows bilateral chronic infiltrates.  The patient states he has chest discomfort, chest tightness, wheezing, cough productive of clear mucus, no fever, weakness and fatigue, muscle aches, headaches, sinus congestion noted as well.  Patient also notes nausea and abdominal tenderness and no emesis.  He has no appetite.  He does have excess gas at this time.  We did give him prescriptions for omeprazole simple saline simethicone at the last visit.  Patient also was given a prescription for Metformin which he is taking 1 daily.  He does not have any glucose testing supplies at this time.  Note today's glucose is 123  The patient works as a Mudlogger  Past Medical History:  Diagnosis Date  . Cancer ([place])   . Diabetes mellitus, type II ([place])   . Dyslipidemia   . Stomach cancer ([place])      Family History  Problem Relation Age of Onset  . Early death Mother        childbirth  . Cancer Father   . Stomach cancer Sister 18  . Stroke Sister   . Diabetes Daughter      Social History   Socioeconomic History  . Marital status: Married    Spouse name: Not on file  . Number  of children: Not on file  . Years of education: Not on file  . Highest education level: Not on file  Occupational History  . Not on file  Tobacco Use  . Smoking status: Current Some Day Smoker    Packs/day: 0.50  . Smokeless tobacco: Never Used  Substance and Sexual Activity  . Alcohol use: Yes    Comment: occ  . Drug use: No  . Sexual activity: Not on file  Other Topics Concern  . Not  on file  Social History Narrative  . Not on file   Social Determinants of Health   Financial Resource Strain:   . Difficulty of Paying Living Expenses: Not on file  Food Insecurity:   . Worried About Charity fundraiser in the Last Year: Not on file  . Ran Out of Food in the Last Year: Not on file  Transportation Needs:   . Lack of Transportation (Medical): Not on file  . Lack of Transportation (Non-Medical): Not on file  Physical Activity:   . Days of Exercise per Week: Not on file  . Minutes of Exercise per Session: Not on file  Stress:   . Feeling of Stress : Not on file  Social Connections:   . Frequency of Communication with Friends and Family: Not on file  . Frequency of Social Gatherings with Friends and Family: Not on file  . Attends Religious Services: Not on file  . Active Member of Clubs or Organizations: Not on file  . Attends Archivist Meetings: Not on file  . Marital Status: Not on file  Intimate Partner Violence:   . Fear of Current or Ex-Partner: Not on file  . Emotionally Abused: Not on file  . Physically Abused: Not on file  . Sexually Abused: Not on file     No Known Allergies   Outpatient Medications Prior to Visit  Medication Sig Dispense Refill  . albuterol (VENTOLIN HFA) 108 (90 Base) MCG/ACT inhaler Inhale 1-2 puffs into the lungs every 4 (four) hours as needed for wheezing or shortness of breath. 8 g 1  . metFORMIN (GLUCOPHAGE) 500 MG tablet Take 1 tablet (500 mg total) by mouth daily with breakfast. 30 tablet 2  . omeprazole (PRILOSEC) 20 MG capsule Take 1 capsule (20 mg total) by mouth daily. 30 capsule 4  . simethicone (GAS-X) 80 MG chewable tablet Chew 1 tablet (80 mg total) by mouth every 6 (six) hours as needed for flatulence. 30 tablet 0  . sodium chloride (OCEAN) 0.65 % SOLN nasal spray Place 1 spray into both nostrils as needed for congestion. 30 mL 0  . promethazine-dextromethorphan (PROMETHAZINE-DM) 6.25-15 MG/5ML syrup Take 5  mLs by mouth 4 (four) times daily as needed for cough. (Patient not taking: Reported on 11/10/2019) 240 mL 0   No facility-administered medications prior to visit.     Review of Systems Constitutional:    weight loss, night sweats,  Fevers, chills, fatigue, lassitude. HEENT:    headaches,  Difficulty swallowing,  Tooth/dental problems,  Sore throat,                No sneezing, itching, ear ache, nasal congestion, post nasal drip,   CV:   chest pain,  Orthopnea, PND, swelling in lower extremities, anasarca, dizziness, palpitations  GI   heartburn, indigestion, abdominal pain, nausea, vomiting, diarrhea, change in bowel habits, loss of appetite  Resp:shortness of breath with exertion and at at rest.  No excess mucus, no productive cough,  No non-productive cough,  No coughing up of blood.  No change in color of mucus.  No wheezing.  No chest wall deformity  Skin: no rash or lesions.  GU: no dysuria, change in color of urine, no urgency or frequency.  No flank pain.  MS:  No joint pain or swelling.  No decreased range of motion.  No back pain.  Psych:  No change in mood or affect. No depression or anxiety.  No memory loss.     Objective:   Physical Exam Vitals:   11/10/19 0905  BP: (!) 141/90  Pulse: 80  Temp: 98 F (36.7 C)  TempSrc: Oral  SpO2: 99%  Weight: 144 lb (65.3 kg)    Gen: Pleasant, well-nourished, in no distress,  normal affect  ENT: No lesions,  mouth clear,  oropharynx clear, no postnasal drip  Neck: No JVD, no TMG, no carotid bruits  Lungs: No use of accessory muscles, no dullness to percussion, distant breath sounds with few expiratory wheezes  Cardiovascular: RRR, heart sounds normal, no murmur or gallops, no peripheral edema  Abdomen: soft and NT, no HSM,  BS normal  Musculoskeletal: No deformities, no cyanosis or clubbing, slight purplish coloration to toes  Neuro: alert, non focal  Skin: Warm, no lesions or rashes  All labs reviewed from  previous visit  oximetry check today showed on room air saturation 94% at rest drops to 88% on room air with ambulating around the office goes back up to 96% with 2 L      Assessment & Plan:  I personally reviewed all images and lab data in the [facility] system as well as any outside material available during this office visit and agree with the  radiology impressions.   Pneumonia due to COVID-19 virus Chronic respiratory failure due to prior Covid pneumonia now with ongoing lung injury and hypoxemia  Plan here will be to continue oxygen 2 L exertion and during sleep and as needed during the day.  Note oximetry check today showed on room air saturation 94% at rest drops to 88% on room air with ambulating around the office goes back up to 96% with 2 L  We will also plan a pulse dose of prednisone, begin Advair 230 mcg at 2 inhalations twice daily, begin vitamin D and C supplementation and zinc supplementation.  We will also give azithromycin for 5 days and prescribe an albuterol inhaler as well  Because of the degree of hypoxemia I am concerned about blood clotting in the lung and will obtain a CT angiogram of the chest  Uncontrolled type 2 diabetes mellitus with hyperglycemia (HCC) Type 2 diabetes under reasonable control will obtain hemoglobin A1c continue Metformin and give patient testing supplies  Chronic respiratory failure with hypoxia ([place]) As per Covid pneumonia assessment will continue oxygen therapy   Shavon was seen today for hospitalization follow-up.  Diagnoses and all orders for this visit:  Pneumonia due to COVID-19 virus -     Comprehensive metabolic panel -     CBC with Differential/Platelet; Future -     CT Angio Chest W/Cm &/Or Wo Cm; Future -     CBC with Differential/Platelet  Uncontrolled type 2 diabetes mellitus with hyperglycemia (HCC) -     Glucose (CBG) -     Comprehensive metabolic panel -     Hemoglobin A1c -     CBC with Differential/Platelet; Future -      Microalbumin, urine -     CBC with  Differential/Platelet  Need for hepatitis C screening test -     Hepatitis c antibody (reflex)  Chronic respiratory failure with hypoxia (HCC)  Other orders -     fluticasone-salmeterol (ADVAIR HFA) 230-21 MCG/ACT inhaler; Inhale 2 puffs into the lungs 2 (two) times daily. -     Discontinue: predniSONE (DELTASONE) 10 MG tablet; Take 1 tablet (10 mg total) by mouth daily for 10 days. -     azithromycin (ZITHROMAX) 250 MG tablet; Take 1 tablet (250 mg total) by mouth daily. Take two once then one daily until gone -     Blood Glucose Monitoring Suppl (TRUE METRIX METER) w/Device KIT; Use to measure blood sugar twice a day -     TRUEplus Lancets 28G MISC; Use to measure blood sugar twice a day -     glucose blood (TRUE METRIX BLOOD GLUCOSE TEST) test strip; Use as instructed -     Cholecalciferol (VITAMIN D) 50 MCG (2000 UT) tablet; Take 1 tablet (2,000 Units total) by mouth daily. -     Ascorbic Acid (VITAMIN C) 500 MG CAPS; Take 1 capsule by mouth daily. -     zinc gluconate 50 MG tablet; Take 1 tablet (50 mg total) by mouth daily. -     predniSONE (DELTASONE) 10 MG tablet; Take 4 tablets daily for 5 days then stop  Also plan hepatitis C assay and obtain urine for microalbumin

## 2019-11-10 NOTE — Assessment & Plan Note (Signed)
Type 2 diabetes under reasonable control will obtain hemoglobin A1c continue Metformin and give patient testing supplies

## 2019-11-11 LAB — CBC WITH DIFFERENTIAL/PLATELET
Basophils Absolute: 0 10*3/uL (ref 0.0–0.2)
Basos: 0 %
EOS (ABSOLUTE): 0.1 10*3/uL (ref 0.0–0.4)
Eos: 3 %
Hematocrit: 41.7 % (ref 37.5–51.0)
Hemoglobin: 13.9 g/dL (ref 13.0–17.7)
Immature Grans (Abs): 0 10*3/uL (ref 0.0–0.1)
Immature Granulocytes: 1 %
Lymphocytes Absolute: 1 10*3/uL (ref 0.7–3.1)
Lymphs: 22 %
MCH: 30.2 pg (ref 26.6–33.0)
MCHC: 33.3 g/dL (ref 31.5–35.7)
MCV: 91 fL (ref 79–97)
Monocytes Absolute: 0.5 10*3/uL (ref 0.1–0.9)
Monocytes: 11 %
Neutrophils Absolute: 2.9 10*3/uL (ref 1.4–7.0)
Neutrophils: 63 %
Platelets: 319 10*3/uL (ref 150–450)
RBC: 4.6 x10E6/uL (ref 4.14–5.80)
RDW: 14.5 % (ref 11.6–15.4)
WBC: 4.6 10*3/uL (ref 3.4–10.8)

## 2019-11-11 LAB — COMPREHENSIVE METABOLIC PANEL WITH GFR
ALT: 27 IU/L (ref 0–44)
AST: 18 IU/L (ref 0–40)
Albumin/Globulin Ratio: 1.5 (ref 1.2–2.2)
Albumin: 4 g/dL (ref 3.8–4.9)
Alkaline Phosphatase: 108 IU/L (ref 39–117)
BUN/Creatinine Ratio: 21 (ref 10–24)
BUN: 10 mg/dL (ref 8–27)
Bilirubin Total: 1 mg/dL (ref 0.0–1.2)
CO2: 26 mmol/L (ref 20–29)
Calcium: 9.4 mg/dL (ref 8.6–10.2)
Chloride: 101 mmol/L (ref 96–106)
Creatinine, Ser: 0.47 mg/dL — ABNORMAL LOW (ref 0.76–1.27)
GFR calc Af Amer: 140 mL/min/1.73
GFR calc non Af Amer: 121 mL/min/1.73
Globulin, Total: 2.7 g/dL (ref 1.5–4.5)
Glucose: 110 mg/dL — ABNORMAL HIGH (ref 65–99)
Potassium: 3.9 mmol/L (ref 3.5–5.2)
Sodium: 140 mmol/L (ref 134–144)
Total Protein: 6.7 g/dL (ref 6.0–8.5)

## 2019-11-11 LAB — MICROALBUMIN, URINE: Microalbumin, Urine: 19.5 ug/mL

## 2019-11-11 LAB — HEMOGLOBIN A1C
Est. average glucose Bld gHb Est-mCnc: 151 mg/dL
Hgb A1c MFr Bld: 6.9 % — ABNORMAL HIGH (ref 4.8–5.6)

## 2019-11-11 LAB — HEPATITIS C ANTIBODY (REFLEX): HCV Ab: <0.1 s/co ratio (ref 0.0–0.9)

## 2019-11-11 LAB — HCV COMMENT:

## 2019-11-13 ENCOUNTER — Ambulatory Visit (HOSPITAL_COMMUNITY)
Admission: RE | Admit: 2019-11-13 | Discharge: 2019-11-13 | Disposition: A | Discharge status: Home / Self Care | Payer: HRSA Program | Source: Ambulatory Visit | Attending: Critical Care Medicine | Admitting: Critical Care Medicine

## 2019-11-13 ENCOUNTER — Other Ambulatory Visit: Payer: Self-pay

## 2019-11-13 DIAGNOSIS — J1282 Pneumonia due to coronavirus disease 2019: Secondary | ICD-10-CM | POA: Diagnosis present

## 2019-11-13 DIAGNOSIS — U071 COVID-19: Secondary | ICD-10-CM | POA: Insufficient documentation

## 2019-11-13 MED ORDER — IOHEXOL 350 MG/ML SOLN
75.0000 mL | Freq: Once | INTRAVENOUS | Status: AC | PRN
  Administered 2019-11-13: 75 mL via INTRAVENOUS

## 2019-11-17 ENCOUNTER — Telehealth: Payer: Self-pay | Admitting: Critical Care Medicine

## 2019-11-17 ENCOUNTER — Telehealth: Payer: Self-pay | Admitting: *Deleted

## 2019-11-17 MED ORDER — METFORMIN HCL 500 MG PO TABS: 500.0000 mg | ORAL_TABLET | Freq: Two times a day (BID) | ORAL | 2 refills | Status: DC

## 2019-11-17 NOTE — Telephone Encounter (Signed)
Medical Assistant used [employer] to contact patient.  Interpreter Name: Juliene Pina Interpreter #: [phone] Patient is aware of A1C improving and kidney, liver and CBC being normal. Patient advised to continue with metformin. Patient states he is taking the medication once a day.

## 2019-11-17 NOTE — Telephone Encounter (Signed)
-----   Message from Elsie Stain, MD sent at 11/11/2019  4:22 PM EST ----- Let patient know his HgbA1C is better, stay on metformin.  Liver funtion is now normal.  Kidneys normal , microalbumin of urine normal, hep C negative, blood counts normal

## 2019-11-17 NOTE — Telephone Encounter (Signed)
I connected with this patient who has COVID-19 pneumonia and now has chronic and acute lung injury from the pneumonia.  He is no longer contagious however he is having ongoing inflammation in the lungs  He states he is better since he started prednisone azithromycin and the inhalers  The patient CT scan did not show pulmonary emboli but did show pulmonary fibrosis and groundglass inflammation compatible with Covid induced lung injury  He also has elevated hemoglobin A1c at 6.9 we will increase Metformin to 500 mg twice daily  I answered all his questions and we will see him back in follow-up in the clinic February 2

## 2019-11-18 ENCOUNTER — Telehealth: Payer: Self-pay | Admitting: Critical Care Medicine

## 2019-11-18 NOTE — Telephone Encounter (Signed)
Patient called saying that he would like a call from his PCP because he states that yesterday afternoon his stomach started hurting and the pain went to the right side of his chest . Patient also stated that his breathing is well it's only when he takes deep breathes it feels like he can't catch his breath. Please f/u

## 2019-11-19 NOTE — Telephone Encounter (Signed)
Using pac[place] interpreter [place], [place] spoke to this patient and went over his current symptoms again and explained why he is having these symptoms.  He agrees to continuing his medications

## 2019-11-28 MED FILL — metFORMIN HCL 500 MG TABS: 500 | 30 days supply | Qty: 60 | Fill #0

## 2019-12-02 ENCOUNTER — Encounter: Payer: Self-pay | Admitting: Critical Care Medicine

## 2019-12-02 ENCOUNTER — Ambulatory Visit: Payer: Self-pay | Attending: Critical Care Medicine | Admitting: Critical Care Medicine

## 2019-12-02 ENCOUNTER — Other Ambulatory Visit: Payer: Self-pay

## 2019-12-02 VITALS — BP 130/87 | HR 84 | Temp 97.5°F | Resp 16 | Wt 146.0 lb

## 2019-12-02 DIAGNOSIS — E1165 Type 2 diabetes mellitus with hyperglycemia: Secondary | ICD-10-CM

## 2019-12-02 DIAGNOSIS — Z8616 Personal history of COVID-19: Secondary | ICD-10-CM

## 2019-12-02 DIAGNOSIS — J9611 Chronic respiratory failure with hypoxia: Secondary | ICD-10-CM

## 2019-12-02 DIAGNOSIS — J8410 Pulmonary fibrosis, unspecified: Secondary | ICD-10-CM | POA: Insufficient documentation

## 2019-12-02 DIAGNOSIS — E119 Type 2 diabetes mellitus without complications: Secondary | ICD-10-CM

## 2019-12-02 LAB — GLUCOSE, POCT (MANUAL RESULT ENTRY): POC Glucose: 118 mg/dl — AB (ref 70–99)

## 2019-12-02 MED ORDER — SENNOSIDES-DOCUSATE SODIUM 8.6-50 MG PO TABS: 1.0000 | ORAL_TABLET | Freq: Every day | ORAL | 1 refills | Status: DC

## 2019-12-02 NOTE — Assessment & Plan Note (Signed)
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

## 2019-12-02 NOTE — Patient Instructions (Addendum)
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 Joya Gaskins in 6 weeks  Begin a men's multivitamin 1 daily and also begin vitamin D 1 daily

## 2019-12-02 NOTE — Progress Notes (Signed)
F/u  Reflux-  Feels like he has to cough when he breathes deeply  Constipation

## 2019-12-02 NOTE — Assessment & Plan Note (Signed)
Chronic respiratory failure with hypoxemia secondary to Covid pneumonia and scarring in the lungs slowly resolving  I asked the patient to stop taking oxygen and leave it on as-needed basis for now

## 2019-12-02 NOTE — Progress Notes (Signed)
Subjective:    Patient ID: Walter Phillips, male    DOB: 10-28-1961, 59 y.o.   MRN: [id number]   59 year old male [place] who was admitted for Covid pneumonia between the 12th and 24 December.  He had significant hypoxemia and ultimately had to be discharged on oxygen 2 L rest 4 L exertion.  This is a post hospital visit.  Below is the discharge summary.  Admit date:10/15/2019 Discharge date:10/23/2019  Discharge summary: 59 year old gentleman with history of type 2 diabetes on Metformin, hyperlipidemia presented to the emergency room with complaining of shortness of breath. In the emergency room he was tachypneic, 88% on room air, initially placed on nonrebreather. He was admitted to the hospital with COVID-19 pneumonia.  Acute hypoxemic respiratory failure secondary to COVID-19 pneumonia: He completed 5 days of remdesivir therapy, he was started on steroid as outpatient, finished about 10 days of steroid therapy. Patient also received a dose of convalescent plasma and dose of Actemra on admission. His clinical status has improved, however he still requires supplemental oxygen. Has improved from acute phase of infection, however remains persistently hypoxic. Patient will need to go home on oxygen and follow-up outpatient for subsequent weaning off the oxygen.  His blood sugars are elevated, hemoglobin A1c 7.9. He is uncomfortable using insulin at home. Since now he is going to stop further steroids, he will just resume Metformin and control his diet.  Discharge Diagnoses: Principal Problem: Sepsis ([place]) Active Problems: Pneumonia due to COVID-19 virus Acute respiratory failure with hypoxia ([place]) Dyslipidemia Uncontrolled type 2 diabetes mellitus with hyperglycemia ([place])   11/10/2019 This is a return follow-up visit for this 59 year old male who was admitted in December for Covid pneumonia.  He had diffuse bilateral infiltrates and low lung volumes and required  oxygen therapy on discharge.  I did a telephone visit with him last week and our plan was to get him into the office today for further evaluation.  Nevertheless he went back to the emergency room on 8 January and they discharged him without any changes in plan of care except for the prescription of an albuterol inhaler.  His oxygen saturation on room air was normal at that visit.  Chest x-ray is repeated and still shows bilateral chronic infiltrates.  The patient states he has chest discomfort, chest tightness, wheezing, cough productive of clear mucus, no fever, weakness and fatigue, muscle aches, headaches, sinus congestion noted as well.  Patient also notes nausea and abdominal tenderness and no emesis.  He has no appetite.  He does have excess gas at this time.  We did give him prescriptions for omeprazole simple saline simethicone at the last visit.  Patient also was given a prescription for Metformin which he is taking 1 daily.  He does not have any glucose testing supplies at this time.  Note today's glucose is 123  The patient works as a Mudlogger  12/02/2019 Note this visit was face-to-face and we used [place] Since the last visit the patient's breathing has improved.  He is off oxygen during the day.  He has no cough.  The patient still experiences some chest pain.  He is taking the omeprazole daily.  He notes less gas on the simethicone.  The patient did finish his course of prednisone.  He does maintain the Advair twice daily along with vitamin D and C supplementations he is now off zinc. Note CT angiogram showed persistent scarring and groundglass inflammation in the lung but no pulmonary  emboli seen  Patient's blood sugars are under good control with 95-100 range fasting and up to 150s in the afternoons on the once daily Metformin 500 mg  Note liver functions were improving hepatitis C assay negative urine for microalbumin negative    Past  Medical History:  Diagnosis Date  . Cancer ([place])   . Diabetes mellitus, type II ([place])   . Dyslipidemia   . Stomach cancer ([place])      Family History  Problem Relation Age of Onset  . Early death Mother        childbirth  . Cancer Father   . Stomach cancer Sister 3  . Stroke Sister   . Diabetes Daughter      Social History   Socioeconomic History  . Marital status: Married    Spouse name: Not on file  . Number of children: Not on file  . Years of education: Not on file  . Highest education level: Not on file  Occupational History  . Not on file  Tobacco Use  . Smoking status: Current Some Day Smoker    Packs/day: 0.50  . Smokeless tobacco: Never Used  Substance and Sexual Activity  . Alcohol use: Yes    Comment: occ  . Drug use: No  . Sexual activity: Not on file  Other Topics Concern  . Not on file  Social History Narrative  . Not on file   Social Determinants of Health   Financial Resource Strain:   . Difficulty of Paying Living Expenses: Not on file  Food Insecurity:   . Worried About Charity fundraiser in the Last Year: Not on file  . Ran Out of Food in the Last Year: Not on file  Transportation Needs:   . Lack of Transportation (Medical): Not on file  . Lack of Transportation (Non-Medical): Not on file  Physical Activity:   . Days of Exercise per Week: Not on file  . Minutes of Exercise per Session: Not on file  Stress:   . Feeling of Stress : Not on file  Social Connections:   . Frequency of Communication with Friends and Family: Not on file  . Frequency of Social Gatherings with Friends and Family: Not on file  . Attends Religious Services: Not on file  . Active Member of Clubs or Organizations: Not on file  . Attends Archivist Meetings: Not on file  . Marital Status: Not on file  Intimate Partner Violence:   . Fear of Current or Ex-Partner: Not on file  . Emotionally Abused: Not on file  . Physically Abused: Not on file  .  Sexually Abused: Not on file     No Known Allergies   Outpatient Medications Prior to Visit  Medication Sig Dispense Refill  . albuterol (VENTOLIN HFA) 108 (90 Base) MCG/ACT inhaler Inhale 1-2 puffs into the lungs every 4 (four) hours as needed for wheezing or shortness of breath. 8 g 1  . fluticasone-salmeterol (ADVAIR HFA) 230-21 MCG/ACT inhaler Inhale 2 puffs into the lungs 2 (two) times daily. 1 Inhaler 12  . glucose blood (TRUE METRIX BLOOD GLUCOSE TEST) test strip Use as instructed 100 each 12  . metFORMIN (GLUCOPHAGE) 500 MG tablet Take 1 tablet (500 mg total) by mouth 2 (two) times daily with a meal. (Patient taking differently: Take 500 mg by mouth daily with breakfast. ) 60 tablet 2  . omeprazole (PRILOSEC) 20 MG capsule Take 1 capsule (20 mg total) by mouth daily. 30 capsule  4  . promethazine-dextromethorphan (PROMETHAZINE-DM) 6.25-15 MG/5ML syrup Take 5 mLs by mouth 4 (four) times daily as needed for cough. 240 mL 0  . simethicone (GAS-X) 80 MG chewable tablet Chew 1 tablet (80 mg total) by mouth every 6 (six) hours as needed for flatulence. 30 tablet 0  . sodium chloride (OCEAN) 0.65 % SOLN nasal spray Place 1 spray into both nostrils as needed for congestion. 30 mL 0  . Blood Glucose Monitoring Suppl (TRUE METRIX METER) w/Device KIT Use to measure blood sugar twice a day 1 kit 0  . TRUEplus Lancets 28G MISC Use to measure blood sugar twice a day 100 each 1  . Ascorbic Acid (VITAMIN C) 500 MG CAPS Take 1 capsule by mouth daily. (Patient not taking: Reported on 12/02/2019) 30 capsule 1  . azithromycin (ZITHROMAX) 250 MG tablet Take 1 tablet (250 mg total) by mouth daily. Take two once then one daily until gone (Patient not taking: Reported on 12/02/2019) 6 tablet 0  . Cholecalciferol (VITAMIN D) 50 MCG (2000 UT) tablet Take 1 tablet (2,000 Units total) by mouth daily. (Patient not taking: Reported on 12/02/2019) 30 tablet 1  . predniSONE (DELTASONE) 10 MG tablet Take 4 tablets daily for 5  days then stop (Patient not taking: Reported on 12/02/2019) 20 tablet 0  . zinc gluconate 50 MG tablet Take 1 tablet (50 mg total) by mouth daily. (Patient not taking: Reported on 12/02/2019) 30 tablet 1   No facility-administered medications prior to visit.     Review of Systems Constitutional:    weight loss, night sweats,  Fevers, chills, fatigue, lassitude. HEENT:    headaches,  Difficulty swallowing,  Tooth/dental problems,  Sore throat,                No sneezing, itching, ear ache, nasal congestion, post nasal drip,   CV:   chest pain,  Orthopnea, PND, swelling in lower extremities, anasarca, dizziness, palpitations  GI   heartburn, indigestion, abdominal pain, nausea, vomiting, diarrhea, change in bowel habits, loss of appetite  Resp:shortness of breath with exertion and at at rest.  No excess mucus, no productive cough,  No non-productive cough,  No coughing up of blood.  No change in color of mucus.  No wheezing.  No chest wall deformity  Skin: no rash or lesions.  GU: no dysuria, change in color of urine, no urgency or frequency.  No flank pain.  MS:  No joint pain or swelling.  No decreased range of motion.  No back pain.  Psych:  No change in mood or affect. No depression or anxiety.  No memory loss.     Objective:   Physical Exam Vitals:   12/02/19 0958  BP: 130/87  Pulse: 84  Resp: 16  Temp: (!) 97.5 F (36.4 C)  TempSrc: Core  SpO2: 98%  Weight: 146 lb (66.2 kg)    Gen: Pleasant, well-nourished, in no distress,  normal affect  ENT: No lesions,  mouth clear,  oropharynx clear, no postnasal drip  Neck: No JVD, no TMG, no carotid bruits  Lungs: No use of accessory muscles, no dullness to percussion, distant breath sounds with wheezes improved  Cardiovascular: RRR, heart sounds normal, no murmur or gallops, no peripheral edema  Abdomen: soft and NT, no HSM,  BS normal  Musculoskeletal: No deformities, no cyanosis or clubbing, slight purplish coloration to  toes  Neuro: alert, non focal  Skin: Warm, no lesions or rashes  All labs reviewed from previous visit  oximetry check today showed on room air saturation 94% at rest and drops to 89 to 91% with exertion on room air     Assessment & Plan:  I personally reviewed all images and lab data in the [facility] system as well as any outside material available during this office visit and agree with the  radiology impressions.   History of COVID-19 History of pneumonia due to Covid virus infection  Residual of hypoxemia secondary to pulmonary scarring post viral syndrome and associated airway obstruction  Plan will be to continue Advair 2 inhalations twice daily as needed albuterol  Patient can cycle off vitamin C and vitamin D and zinc at this time    Chronic respiratory failure with hypoxia ([facility]) Chronic respiratory failure with hypoxemia secondary to Covid pneumonia and scarring in the lungs slowly resolving  I asked the patient to stop taking oxygen and leave it on as-needed basis for now  Controlled type 2 diabetes mellitus ([place]) Controlled type 2 diabetes on daily Metformin with hemoglobin A1c now down to 6.9  No change in medications for now  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

## 2019-12-02 NOTE — Assessment & Plan Note (Signed)
History of pneumonia due to Covid virus infection  Residual of hypoxemia secondary to pulmonary scarring post viral syndrome and associated airway obstruction  Plan will be to continue Advair 2 inhalations twice daily as needed albuterol  Patient can cycle off vitamin C and vitamin D and zinc at this time

## 2019-12-02 NOTE — Assessment & Plan Note (Signed)
Controlled type 2 diabetes on daily Metformin with hemoglobin A1c now down to 6.9  No change in medications for now

## 2020-01-13 ENCOUNTER — Ambulatory Visit: Payer: Self-pay | Attending: Critical Care Medicine | Admitting: Critical Care Medicine

## 2020-01-13 ENCOUNTER — Other Ambulatory Visit: Payer: Self-pay

## 2020-01-13 ENCOUNTER — Encounter: Payer: Self-pay | Admitting: Critical Care Medicine

## 2020-01-13 ENCOUNTER — Telehealth: Payer: Self-pay

## 2020-01-13 VITALS — BP 131/86 | HR 73 | Temp 98.3°F | Ht 65.0 in | Wt 159.8 lb

## 2020-01-13 DIAGNOSIS — J8410 Pulmonary fibrosis, unspecified: Secondary | ICD-10-CM

## 2020-01-13 DIAGNOSIS — Z1211 Encounter for screening for malignant neoplasm of colon: Secondary | ICD-10-CM

## 2020-01-13 DIAGNOSIS — Z8616 Personal history of COVID-19: Secondary | ICD-10-CM

## 2020-01-13 DIAGNOSIS — J9611 Chronic respiratory failure with hypoxia: Secondary | ICD-10-CM

## 2020-01-13 DIAGNOSIS — E119 Type 2 diabetes mellitus without complications: Secondary | ICD-10-CM

## 2020-01-13 MED ORDER — SENNOSIDES-DOCUSATE SODIUM 8.6-50 MG PO TABS: 1.0000 | ORAL_TABLET | Freq: Every day | ORAL | 1 refills | Status: DC

## 2020-01-13 MED ORDER — METFORMIN HCL 500 MG PO TABS: 500.0000 mg | ORAL_TABLET | Freq: Every day | ORAL | Status: DC

## 2020-01-13 NOTE — Progress Notes (Signed)
Walter Phillips is a 59 year old male with a history of type 2 diabetes mellitus, hyperlipidemia, COVID-19 infection (hospitalized 10/11/2019 through 10/23/2019), and pulmonary fibrosis who presents today for a follow up visit.  He no longer requires supplemental oxygen and his respiratory symptoms are improving.  He is back at work and has noted that as he starts to work faster he notices occasional shortness of breath.  During these episodes, he sits down for a few minutes and the symptoms resolve spontaneously.  He has chest tightness with deep inspiration.  He is using his rescue inhaler 1-2 times per week.  Denies cough, chest pain, SOB at rest.  Walking test revealed lowest oxygen saturation of 94-95% on room air with no dyspnea.    He occasionally checks his fasting blood sugar and it is usually in the 110's.  Endorses compliance with medication and a diabetic diet.  Today he mentions that he is occasionally experiencing hard stools and he hasn't taken anything to relieve symptoms.  ROS: Positive for chest tightness and shortness of breath with exertion.  Otherwise negative.  Physical exam:  Unremarkable

## 2020-01-13 NOTE — Telephone Encounter (Signed)
Order to discontinue home O2 faxed to [place]

## 2020-01-13 NOTE — Assessment & Plan Note (Signed)
Chronic respiratory failure with hypoxemia due to COVID-19 lung injury and post viral bronchiolitis  This is improved and the patient no longer needs oxygen therefore we will discontinue the oxygen from the home environment

## 2020-01-13 NOTE — Patient Instructions (Signed)
Continue your inhalers  Senokot was refilled for your bowels  Tetanus shot Pneumovax was given  A fecal occult kit was given to check for colon cancer screening  We will be checking your hemoglobin A1c lipid panel and liver function today  Please consider getting the Covid vaccine when it is return  Return to see Dr. Joya Gaskins in 4 months  We will call to have the oxygen removed from your home

## 2020-01-13 NOTE — Assessment & Plan Note (Signed)
Controlled type 2 diabetes mellitus stable on Metformin 500 mg daily  We will follow-up hemoglobin A1c at this visit along with lipid panel and liver function studies  We have not started cholesterol medications as he had elevated liver functions with his Covid infection

## 2020-01-13 NOTE — Progress Notes (Signed)
covid f/u

## 2020-01-13 NOTE — Progress Notes (Signed)
Subjective:    Patient ID: Walter Phillips, male    DOB: 10-28-1961, 59 y.o.   MRN: [id number]   59 year old male Walter Phillips who was admitted for Covid pneumonia between the 12th and 24 December.  He had significant hypoxemia and ultimately had to be discharged on oxygen 2 L rest 4 L exertion.  This is a post hospital visit.  Below is the discharge summary.  Admit date:10/15/2019 Discharge date:10/23/2019  Discharge summary: 59 year old gentleman with history of type 2 diabetes on Metformin, hyperlipidemia presented to the emergency room with complaining of shortness of breath. In the emergency room he was tachypneic, 88% on room air, initially placed on nonrebreather. He was admitted to the hospital with COVID-19 pneumonia.  Acute hypoxemic respiratory failure secondary to COVID-19 pneumonia: He completed 5 days of remdesivir therapy, he was started on steroid as outpatient, finished about 10 days of steroid therapy. Patient also received a dose of convalescent plasma and dose of Actemra on admission. His clinical status has improved, however he still requires supplemental oxygen. Has improved from acute phase of infection, however remains persistently hypoxic. Patient will need to go home on oxygen and follow-up outpatient for subsequent weaning off the oxygen.  His blood sugars are elevated, hemoglobin A1c 7.9. He is uncomfortable using insulin at home. Since now he is going to stop further steroids, he will just resume Metformin and control his diet.  Discharge Diagnoses: Principal Problem: Sepsis ([place]) Active Problems: Pneumonia due to COVID-19 virus Acute respiratory failure with hypoxia ([place]) Dyslipidemia Uncontrolled type 2 diabetes mellitus with hyperglycemia ([place])   11/10/2019 This is a return follow-up visit for this 59 year old male who was admitted in December for Covid pneumonia.  He had diffuse bilateral infiltrates and low lung volumes and required  oxygen therapy on discharge.  I did a telephone visit with him last week and our plan was to get him into the office today for further evaluation.  Nevertheless he went back to the emergency room on 8 January and they discharged him without any changes in plan of care except for the prescription of an albuterol inhaler.  His oxygen saturation on room air was normal at that visit.  Chest x-ray is repeated and still shows bilateral chronic infiltrates.  The patient states he has chest discomfort, chest tightness, wheezing, cough productive of clear mucus, no fever, weakness and fatigue, muscle aches, headaches, sinus congestion noted as well.  Patient also notes nausea and abdominal tenderness and no emesis.  He has no appetite.  He does have excess gas at this time.  We did give him prescriptions for omeprazole simple saline simethicone at the last visit.  Patient also was given a prescription for Metformin which he is taking 1 daily.  He does not have any glucose testing supplies at this time.  Note today's glucose is 123  The patient works as a Mudlogger  12/02/2019 Note this visit was face-to-face and we used [place] Since the last visit the patient's breathing has improved.  He is off oxygen during the day.  He has no cough.  The patient still experiences some chest pain.  He is taking the omeprazole daily.  He notes less gas on the simethicone.  The patient did finish his course of prednisone.  He does maintain the Advair twice daily along with vitamin D and C supplementations he is now off zinc. Note CT angiogram showed persistent scarring and groundglass inflammation in the lung but no pulmonary  emboli seen  Patient's blood sugars are under good control with 95-100 range fasting and up to 150s in the afternoons on the once daily Metformin 500 mg  Note liver functions were improving hepatitis C assay negative urine for microalbumin  negative  01/13/2020 Walter Phillips is a 59 year old male with a history of type 2 diabetes mellitus, hyperlipidemia, COVID-19 infection (hospitalized 10/11/2019 through 10/23/2019), and pulmonary fibrosis who presents today for a follow up visit.  He no longer requires supplemental oxygen and his respiratory symptoms are improving.  He is back at work and has noted that as he starts to work faster he notices occasional shortness of breath.  During these episodes, he sits down for a few minutes and the symptoms resolve spontaneously.  He has chest tightness with deep inspiration.  He is using his rescue inhaler 1-2 times per week.  Denies cough, chest pain, SOB at rest.  Walking test revealed lowest oxygen saturation of 94-95% on room air with no dyspnea.    He occasionally checks his fasting blood sugar and it is usually in the 110's.  Endorses compliance with medication and a diabetic diet.  Today he mentions that he is occasionally experiencing hard stools and he hasn't taken anything to relieve symptoms.  The patient notes occasional chest tightness when he takes a deep breath.  There is no cough.  There is no lower extremity edema.  The patient no longer is using oxygen yet the equipment is still in the home. The patient's run out of his Senokot-S and still is having some constipation.  He has yet to achieve an eye exam for his diabetes  He is due a Pneumovax 23 valent and tetanus vaccine he agrees to take these  He also intends to obtain the Covid vaccine when it becomes available for his group  Past Medical History:  Diagnosis Date  . Cancer ([place])   . Chronic respiratory failure with hypoxia ([place]) 10/15/2019  . Diabetes mellitus, type II ([place])   . Dyslipidemia   . Stomach cancer ([place])      Family History  Problem Relation Age of Onset  . Early death Mother        childbirth  . Cancer Father   . Stomach cancer Sister 57  . Stroke Sister   . Diabetes Daughter      Social  History   Socioeconomic History  . Marital status: Married    Spouse name: Not on file  . Number of children: Not on file  . Years of education: Not on file  . Highest education level: Not on file  Occupational History  . Not on file  Tobacco Use  . Smoking status: Former Smoker    Packs/day: 0.50  . Smokeless tobacco: Never Used  Substance and Sexual Activity  . Alcohol use: Yes    Comment: occ  . Drug use: No  . Sexual activity: Not on file  Other Topics Concern  . Not on file  Social History Narrative  . Not on file   Social Determinants of Health   Financial Resource Strain:   . Difficulty of Paying Living Expenses:   Food Insecurity:   . Worried About Charity fundraiser in the Last Year:   . Arboriculturist in the Last Year:   Transportation Needs:   . Film/video editor (Medical):   Marland Kitchen Lack of Transportation (Non-Medical):   Physical Activity:   . Days of Exercise per Week:   . Minutes  of Exercise per Session:   Stress:   . Feeling of Stress :   Social Connections:   . Frequency of Communication with Friends and Family:   . Frequency of Social Gatherings with Friends and Family:   . Attends Religious Services:   . Active Member of Clubs or Organizations:   . Attends Archivist Meetings:   Marland Kitchen Marital Status:   Intimate Partner Violence:   . Fear of Current or Ex-Partner:   . Emotionally Abused:   Marland Kitchen Physically Abused:   . Sexually Abused:      No Known Allergies   Outpatient Medications Prior to Visit  Medication Sig Dispense Refill  . albuterol (VENTOLIN HFA) 108 (90 Base) MCG/ACT inhaler Inhale 1-2 puffs into the lungs every 4 (four) hours as needed for wheezing or shortness of breath. 8 g 1  . Blood Glucose Monitoring Suppl (TRUE METRIX METER) w/Device KIT Use to measure blood sugar twice a day 1 kit 0  . fluticasone-salmeterol (ADVAIR HFA) 230-21 MCG/ACT inhaler Inhale 2 puffs into the lungs 2 (two) times daily. 1 Inhaler 12  . glucose  blood (TRUE METRIX BLOOD GLUCOSE TEST) test strip Use as instructed 100 each 12  . omeprazole (PRILOSEC) 20 MG capsule Take 1 capsule (20 mg total) by mouth daily. 30 capsule 4  . simethicone (GAS-X) 80 MG chewable tablet Chew 1 tablet (80 mg total) by mouth every 6 (six) hours as needed for flatulence. 30 tablet 0  . TRUEplus Lancets 28G MISC Use to measure blood sugar twice a day 100 each 1  . metFORMIN (GLUCOPHAGE) 500 MG tablet Take 1 tablet (500 mg total) by mouth 2 (two) times daily with a meal. (Patient taking differently: Take 500 mg by mouth daily with breakfast. ) 60 tablet 2  . sodium chloride (OCEAN) 0.65 % SOLN nasal spray Place 1 spray into both nostrils as needed for congestion. (Patient not taking: Reported on 01/13/2020) 30 mL 0  . promethazine-dextromethorphan (PROMETHAZINE-DM) 6.25-15 MG/5ML syrup Take 5 mLs by mouth 4 (four) times daily as needed for cough. (Patient not taking: Reported on 01/13/2020) 240 mL 0  . senna-docusate (SENOKOT-S) 8.6-50 MG tablet Take 1 tablet by mouth daily. (Patient not taking: Reported on 01/13/2020) 30 tablet 1   No facility-administered medications prior to visit.     Review of Systems Constitutional:    weight loss, night sweats,  Fevers, chills, fatigue, lassitude. HEENT:    headaches,  Difficulty swallowing,  Tooth/dental problems,  Sore throat,                No sneezing, itching, ear ache, nasal congestion, post nasal drip,   CV:   chest pain,  Orthopnea, PND, swelling in lower extremities, anasarca, dizziness, palpitations  GI   heartburn, indigestion, abdominal pain, nausea, vomiting, diarrhea, change in bowel habits, loss of appetite  Resp:shortness of breath with exertion and at at rest.  No excess mucus, no productive cough,  No non-productive cough,  No coughing up of blood.  No change in color of mucus.  No wheezing.  No chest wall deformity  Skin: no rash or lesions.  GU: no dysuria, change in color of urine, no urgency or  frequency.  No flank pain.  MS:  No joint pain or swelling.  No decreased range of motion.  No back pain.  Psych:  No change in mood or affect. No depression or anxiety.  No memory loss.     Objective:   Physical Exam Vitals:  01/13/20 0844  BP: 131/86  Pulse: 73  Temp: 98.3 F (36.8 C)  TempSrc: Oral  SpO2: 97%  Weight: 159 lb 12.8 oz (72.5 kg)  Height: '5\' 5"'$  (1.651 m)    Gen: Pleasant, well-nourished, in no distress,  normal affect  ENT: No lesions,  mouth clear,  oropharynx clear, no postnasal drip  Neck: No JVD, no TMG, no carotid bruits  Lungs: No use of accessory muscles, no dullness to percussion, distant breath sounds with wheezes improved  Cardiovascular: RRR, heart sounds normal, no murmur or gallops, no peripheral edema  Abdomen: soft and NT, no HSM,  BS normal  Musculoskeletal: No deformities, no cyanosis or clubbing, slight purplish coloration to toes  Neuro: alert, non focal  Skin: Warm, no lesions or rashes  All labs reviewed from previous visit  oximetry check today showed on room air saturation 94% at rest does not drop below 89% RA with exertion    Assessment & Plan:  I personally reviewed all images and lab data in the [facility] system as well as any outside material available during this office visit and agree with the  radiology impressions.   Chronic respiratory failure with hypoxia (HCC) Chronic respiratory failure with hypoxemia due to COVID-19 lung injury and post viral bronchiolitis  This is improved and the patient no longer needs oxygen therefore we will discontinue the oxygen from the home environment  Postinflammatory pulmonary fibrosis (HCC) Post viral and postinflammatory pulmonary fibrosis stable at this time  Controlled type 2 diabetes mellitus (HCC) Controlled type 2 diabetes mellitus stable on Metformin 500 mg daily  We will follow-up hemoglobin A1c at this visit along with lipid panel and liver function studies  We have not  started cholesterol medications as he had elevated liver functions with his Covid infection   Diagnoses and all orders for this visit:  Colon cancer screening -     Fecal occult blood, imunochemical  Controlled type 2 diabetes mellitus without complication, without long-term current use of insulin (HCC) -     Hemoglobin A1c -     Lipid panel  History of COVID-19 -     Hepatic function panel -     Care order/instruction  Chronic respiratory failure with hypoxia ([place]) -     Care order/instruction  Postinflammatory pulmonary fibrosis ([place]) -     Care order/instruction  Other orders -     senna-docusate (SENOKOT-S) 8.6-50 MG tablet; Take 1 tablet by mouth daily. -     metFORMIN (GLUCOPHAGE) 500 MG tablet; Take 1 tablet (500 mg total) by mouth daily with breakfast.

## 2020-01-13 NOTE — Assessment & Plan Note (Signed)
Post viral and postinflammatory pulmonary fibrosis stable at this time

## 2020-01-14 ENCOUNTER — Telehealth: Payer: Self-pay | Admitting: Critical Care Medicine

## 2020-01-14 LAB — HEPATIC FUNCTION PANEL
ALT: 28 IU/L (ref 0–44)
AST: 20 IU/L (ref 0–40)
Albumin: 4.7 g/dL (ref 3.8–4.9)
Alkaline Phosphatase: 126 IU/L — ABNORMAL HIGH (ref 39–117)
Bilirubin Total: 0.8 mg/dL (ref 0.0–1.2)
Bilirubin, Direct: 0.16 mg/dL (ref 0.00–0.40)
Total Protein: 7.4 g/dL (ref 6.0–8.5)

## 2020-01-14 LAB — LIPID PANEL
Chol/HDL Ratio: 7.5 ratio — ABNORMAL HIGH (ref 0.0–5.0)
Cholesterol, Total: 225 mg/dL — ABNORMAL HIGH (ref 100–199)
HDL: 30 mg/dL — ABNORMAL LOW (ref >39)
LDL Chol Calc (NIH): 88 mg/dL (ref 0–99)
Triglycerides: 648 mg/dL (ref 0–149)
VLDL Cholesterol Cal: 107 mg/dL — ABNORMAL HIGH (ref 5–40)

## 2020-01-14 LAB — HEMOGLOBIN A1C
Est. average glucose Bld gHb Est-mCnc: 134 mg/dL
Hgb A1c MFr Bld: 6.3 % — ABNORMAL HIGH (ref 4.8–5.6)

## 2020-01-14 MED ORDER — ATORVASTATIN CALCIUM 10 MG PO TABS: 10.0000 mg | ORAL_TABLET | Freq: Every day | ORAL | 3 refills | Status: DC

## 2020-01-14 MED FILL — ATORVASTATIN 10 MG TABLET: 10 | 30 days supply | Qty: 30 | Fill #0

## 2020-01-14 NOTE — Telephone Encounter (Signed)
Pt called with lipid results.  He needs to start lipid meds will send atorvastin 10mg  daily  All other labs ok and pt is aware

## 2020-01-15 LAB — FECAL OCCULT BLOOD, IMMUNOCHEMICAL: Fecal Occult Bld: NEGATIVE

## 2020-01-21 ENCOUNTER — Telehealth: Payer: Self-pay | Admitting: *Deleted

## 2020-01-21 NOTE — Telephone Encounter (Signed)
Medical Assistant used [place] [employer] to contact patient.  Interpreter Name: Durene Cal #: [id number] Patient is aware of stool card being negative and low risk for colon cancer.

## 2020-01-21 NOTE — Telephone Encounter (Signed)
-----   Message from Elsie Stain, MD sent at 01/16/2020  7:43 AM EDT ----- Pls let the pt know his occult hemoglobin in stool NEG   low colon cancer risk

## 2020-05-18 ENCOUNTER — Ambulatory Visit: Payer: Self-pay | Admitting: Critical Care Medicine

## 2020-05-19 ENCOUNTER — Encounter: Payer: Self-pay | Admitting: Critical Care Medicine

## 2020-05-19 ENCOUNTER — Ambulatory Visit: Payer: Self-pay | Attending: Critical Care Medicine | Admitting: Critical Care Medicine

## 2020-05-19 ENCOUNTER — Other Ambulatory Visit: Payer: Self-pay

## 2020-05-19 ENCOUNTER — Other Ambulatory Visit: Payer: Self-pay | Admitting: Critical Care Medicine

## 2020-05-19 VITALS — BP 151/87 | HR 62 | Resp 16 | Wt 169.4 lb

## 2020-05-19 DIAGNOSIS — E785 Hyperlipidemia, unspecified: Secondary | ICD-10-CM

## 2020-05-19 DIAGNOSIS — Z8616 Personal history of COVID-19: Secondary | ICD-10-CM

## 2020-05-19 DIAGNOSIS — E119 Type 2 diabetes mellitus without complications: Secondary | ICD-10-CM

## 2020-05-19 DIAGNOSIS — J8410 Pulmonary fibrosis, unspecified: Secondary | ICD-10-CM

## 2020-05-19 DIAGNOSIS — J849 Interstitial pulmonary disease, unspecified: Secondary | ICD-10-CM

## 2020-05-19 DIAGNOSIS — R197 Diarrhea, unspecified: Secondary | ICD-10-CM

## 2020-05-19 DIAGNOSIS — R1013 Epigastric pain: Secondary | ICD-10-CM

## 2020-05-19 LAB — GLUCOSE, POCT (MANUAL RESULT ENTRY): POC Glucose: 151 mg/dl — AB (ref 70–99)

## 2020-05-19 MED ORDER — OLOPATADINE HCL 0.1 % OP SOLN: 1.0000 [drp] | Freq: Two times a day (BID) | OPHTHALMIC | 0 refills | Status: AC

## 2020-05-19 MED ORDER — OMEPRAZOLE 40 MG PO CPDR: 40.0000 mg | DELAYED_RELEASE_CAPSULE | Freq: Every day | ORAL | 3 refills | Status: DC

## 2020-05-19 MED ORDER — ATORVASTATIN CALCIUM 10 MG PO TABS: 10.0000 mg | ORAL_TABLET | Freq: Every day | ORAL | 3 refills | Status: DC

## 2020-05-19 MED ORDER — ADVAIR HFA 230-21 MCG/ACT IN AERO: 2.0000 | INHALATION_SPRAY | Freq: Two times a day (BID) | RESPIRATORY_TRACT | 12 refills | Status: DC

## 2020-05-19 MED ORDER — METFORMIN HCL 500 MG PO TABS: 500.0000 mg | ORAL_TABLET | Freq: Every day | ORAL | 3 refills | Status: DC

## 2020-05-19 MED ORDER — ALBUTEROL SULFATE HFA 108 (90 BASE) MCG/ACT IN AERS: 1.0000 | INHALATION_SPRAY | RESPIRATORY_TRACT | 1 refills | Status: DC | PRN

## 2020-05-19 MED FILL — $VENTOLIN HFA 18G INHALER: 108 (90 BAS | 50 days supply | Qty: 36 | Fill #0

## 2020-05-19 MED FILL — OMEPRAZOLE DR 40 MG CAPSULE: 40 | 30 days supply | Qty: 30 | Fill #0

## 2020-05-19 MED FILL — OLOPATADINE HCL 0.1 % SOLN: 0.1 | 37 days supply | Qty: 5 | Fill #0

## 2020-05-19 MED FILL — ATORVASTATIN 10 MG TABLET: 10 | 30 days supply | Qty: 30 | Fill #0

## 2020-05-19 MED FILL — METFORMIN HCL 500 MG TABS: 500 | 30 days supply | Qty: 30 | Fill #0

## 2020-05-19 MED FILL — $ADVAIR HFA 230-21 MCG INH: 230-21 | 90 days supply | Qty: 36 | Fill #0

## 2020-05-19 NOTE — Patient Instructions (Addendum)
Refills on medications sent to pharmacy Resume advair two puff twice a day Use eye drops in left eye as ordered Follow diet below Obtain stool for analysis  Return Dr Joya Gaskins one month  Dieta rica en fibra High-Fiber Diet La fibra, tambin llamada fibra dietaria, es un tipo de carbohidrato que se encuentra en las frutas, las verduras, los cereales integrales y los frijoles. Una dieta rica en fibra puede tener muchos beneficios para la salud. El mdico puede recomendar una dieta rica en fibra para ayudar a:  Contractor. La fibra puede hacer que defeque con ms frecuencia.  Disminuir el nivel de colesterol.  Aliviar las siguientes afecciones: ? Hinchazn de la venas en el ano (hemorroides). ? Hinchazn e irritacin (inflamacin) de zonas especficas del tracto digestivo (diverticulitis sin complicaciones). ? Un problema del intestino grueso (colon) que, a veces, causa dolor y diarrea (sndrome de colon irritable, SCI).  Evitar comer en exceso como parte de un plan para bajar de peso.  Evitar la enfermedad cardaca, la diabetes tipo 2 y ciertos cnceres. En qu consiste el plan? El consumo diario recomendado de fibra en gramos (g) incluye lo siguiente:  38g para hombres de 74 aos o menos.  30g para los hombres mayores de 50 aos.  25g para mujeres de 50 aos o menos.  21g para las [employer] de 50 aos. Puede recibir la ingesta diaria recomendada de fibra dietaria de la siguiente manera:  Coma una variedad de frutas, verduras, granos y frijoles.  Tome un suplemento de [place], si no es posible comer suficiente fibra en su dieta. Qu debo saber acerca de la dieta rica en fibra?  Es mejor obtener fibra directamente de los alimentos en lugar de recibirla de suplementos de [place]. No hay demasiada investigacin acerca de qu tan efectivos son los suplementos.  Verifique siempre el contenido de fibra en la etiqueta de informacin nutricional de los alimentos  preenvasados. Busque alimentos que contengan 5g o ms de fibra por porcin.  Hable con un especialista en alimentacin y nutricin (nutricionista) si tiene preguntas sobre alimentos especficos que se recomiendan o no para su afeccin mdica, especialmente si aquellos alimentos no estn detallados a continuacin.  Aumente gradualmente la cantidad de fibra que consume. Si aumenta demasiado rpido el consumo de fibra dietaria puede provocar distensin abdominal, clicos o gases.  Beba abundante agua. El [place] a Economist. Cules son algunos consejos para seguir este plan?  Consuma una gran variedad de alimentos ricos en fibra.  Asegrese de que al [employer] mitad de los granos que ingiere cada da sean cereales integrales.  Coma panes y cereales hechos con harina de cereales integrales en lugar de harina refinada o blanca.  Coma arroz integral, trigo burgol o mijo en lugar de Neurosurgeon con un desayuno rico en fibra, como un cereal que contenga al menos 5g de fibra o ms por porcin.  Use frijoles en lugar de carne en las sopas, ensaladas o platos de pastas.  Coma colaciones ricas en fibra, como frutos rojos, verduras crudas, frutos secos y palomitas de maz.  Elija frutas y verduras Insurance claims handler de formas procesadas como jugo o salsa. Qu alimentos puedo comer?  Frutas Frutos rojos. Peras. Manzanas. Naranjas. [place]. Ciruelas y pasas. Higos secos. Verduras Batatas. [place]. Col rizada. [place]as. Repollo. Brcoli. Coliflor. Guisantes. Zanahorias. [place]. Cereales Panes integrales. Cereal multigrano. Avena. Arroz integral. Dwyane Luo. Trigo burgol. Mijo. Quinua. Magdalenas de salvado. Palomitas de maz. Galletas de  centeno. Carnes y otras protenas Frijoles blancos, colorados y pintos. Soja. Guisantes secos. Lentejas. Frutos secos y semillas. Lcteos Yogur fortificado con Pharmacist, hospital. Bebidas Leche de soja fortificada con Fredderick [place]. Jugo de naranja  fortificado con Fredderick [place]. Otros alimentos Barras de [place]. Es posible que los productos mencionados arriba no formen una lista completa de las bebidas y los alimentos recomendados. Comunquese con un nutricionista para conocer ms opciones. Qu alimentos no se recomiendan? Frutas Jugo de frutas. Frutas cocidas coladas. Verduras Papas fritas. Verduras enlatadas. Verduras muy cocidas. Cereales Pan blanco. Pastas hechas con Letitia Neri. Arroz Kerby Moors y otras protenas Cortes de carne con grasa. Pollo o pescado fritos. Lcteos Leche. Yogur. Queso crema. [employer]. Grasas y [employer]. Bebidas Gaseosas. Otros alimentos Tortas y pasteles. Es posible que los productos que se enumeran ms arriba no sean una lista completa de los alimentos y las bebidas que se Higher education careers adviser. Comunquese con un nutricionista para obtener ms informacin. Resumen  La fibra es un tipo de carbohidrato. Se encuentra en las frutas, las verduras, los cereales integrales y los frijoles.  Una dieta rica en fibra representa muchos beneficios para la salud, como evitar el estreimiento, Management consultant colesterol en la [place], ayudar a perder peso y reducir el riesgo de enfermedad cardaca, diabetes y ciertos tipos de cncer.  Aumente gradualmente la ingesta de fibra. El aumento demasiado rpido puede provocar clicos, distensin abdominal y gases. Beba mucha agua a la vez que [employer].  Las mejores fuentes de fibra incluyen frutas y verduras enteras, granos integrales, frutos secos, semillas y frijoles. Esta informacin no tiene Marine scientist el consejo del mdico. Asegrese de hacerle al mdico cualquier pregunta que tenga. Document Revised: 01/26/2018 Document Reviewed: 10/05/2017 Elsevier Patient Education  [place].

## 2020-05-19 NOTE — Assessment & Plan Note (Signed)
Type 2 diabetes under improved control we will continue Metformin daily

## 2020-05-19 NOTE — Assessment & Plan Note (Addendum)
Post pulmonary fibrosis in the setting of prior COVID-19 pneumonia  Post viral airway obstruction persisting  We will plan to obtain a CT scan of the chest for further imaging and refill Advair

## 2020-05-19 NOTE — Progress Notes (Addendum)
Subjective:    Patient ID: Walter Phillips, male    DOB: November 17, 1960, 59 y.o.   MRN: [id number]   59 year old male [name] who was admitted for Covid pneumonia between the 12th and 24 December.  He had significant hypoxemia and ultimately had to be discharged on oxygen 2 L rest 4 L exertion.  This is a post hospital visit.  Below is the discharge summary.  Admit date:10/15/2019 Discharge date:10/23/2019  Discharge summary: 59 year old gentleman with history of type 2 diabetes on Metformin, hyperlipidemia presented to the emergency room with complaining of shortness of breath. In the emergency room he was tachypneic, 88% on room air, initially placed on nonrebreather. He was admitted to the hospital with COVID-19 pneumonia.  Acute hypoxemic respiratory failure secondary to COVID-19 pneumonia: He completed 5 days of remdesivir therapy, he was started on steroid as outpatient, finished about 10 days of steroid therapy. Patient also received a dose of convalescent plasma and dose of Actemra on admission. His clinical status has improved, however he still requires supplemental oxygen. Has improved from acute phase of infection, however remains persistently hypoxic. Patient will need to go home on oxygen and follow-up outpatient for subsequent weaning off the oxygen.  His blood sugars are elevated, hemoglobin A1c 7.9. He is uncomfortable using insulin at home. Since now he is going to stop further steroids, he will just resume Metformin and control his diet.  Discharge Diagnoses: Principal Problem: Sepsis ([place]) Active Problems: Pneumonia due to COVID-19 virus Acute respiratory failure with hypoxia ([place]) Dyslipidemia Uncontrolled type 2 diabetes mellitus with hyperglycemia ([place])   11/10/2019 This is a return follow-up visit for this 59 year old male who was admitted in December for Covid pneumonia.  He had diffuse bilateral infiltrates and low lung volumes and required  oxygen therapy on discharge.  I did a telephone visit with him last week and our plan was to get him into the office today for further evaluation.  Nevertheless he went back to the emergency room on 8 January and they discharged him without any changes in plan of care except for the prescription of an albuterol inhaler.  His oxygen saturation on room air was normal at that visit.  Chest x-ray is repeated and still shows bilateral chronic infiltrates.  The patient states he has chest discomfort, chest tightness, wheezing, cough productive of clear mucus, no fever, weakness and fatigue, muscle aches, headaches, sinus congestion noted as well.  Patient also notes nausea and abdominal tenderness and no emesis.  He has no appetite.  He does have excess gas at this time.  We did give him prescriptions for omeprazole simple saline simethicone at the last visit.  Patient also was given a prescription for Metformin which he is taking 1 daily.  He does not have any glucose testing supplies at this time.  Note today's glucose is 123  The patient works as a Mudlogger  12/02/2019 Note this visit was face-to-face and we used [place] Since the last visit the patient's breathing has improved.  He is off oxygen during the day.  He has no cough.  The patient still experiences some chest pain.  He is taking the omeprazole daily.  He notes less gas on the simethicone.  The patient did finish his course of prednisone.  He does maintain the Advair twice daily along with vitamin D and C supplementations he is now off zinc. Note CT angiogram showed persistent scarring and groundglass inflammation in the lung but no pulmonary  emboli seen  Patient's blood sugars are under good control with 95-100 range fasting and up to 150s in the afternoons on the once daily Metformin 500 mg  Note liver functions were improving hepatitis C assay negative urine for microalbumin  negative  01/13/2020 Walter Phillips is a 59 year old male with a history of type 2 diabetes mellitus, hyperlipidemia, COVID-19 infection (hospitalized 10/11/2019 through 10/23/2019), and pulmonary fibrosis who presents today for a follow up visit.  He no longer requires supplemental oxygen and his respiratory symptoms are improving.  He is back at work and has noted that as he starts to work faster he notices occasional shortness of breath.  During these episodes, he sits down for a few minutes and the symptoms resolve spontaneously.  He has chest tightness with deep inspiration.  He is using his rescue inhaler 1-2 times per week.  Denies cough, chest pain, SOB at rest.  Walking test revealed lowest oxygen saturation of 94-95% on room air with no dyspnea.    He occasionally checks his fasting blood sugar and it is usually in the 110's.  Endorses compliance with medication and a diabetic diet.  Today he mentions that he is occasionally experiencing hard stools and he hasn't taken anything to relieve symptoms.  The patient notes occasional chest tightness when he takes a deep breath.  There is no cough.  There is no lower extremity edema.  The patient no longer is using oxygen yet the equipment is still in the home. The patient's run out of his Senokot-S and still is having some constipation.  He has yet to achieve an eye exam for his diabetes  He is due a Pneumovax 23 valent and tetanus vaccine he agrees to take these  He also intends to obtain the Covid vaccine when it becomes available for his group  05/19/2020 This visit was assisted with Spanish interpreter Walter Phillips [phone] Patient is seen in return follow-up notes cough and dyspnea is improved but he still has episodes of shortness of breath and chest tightness. Note on arrival his hemoglobin A1c is 6.3 he has had the Covid vaccine.  If he works and exerts himself he gets short of breath and has to take a break.  The patient is out of his  Advair he does state the albuterol helps  Past Medical History:  Diagnosis Date  . Cancer ([place])   . Chronic respiratory failure with hypoxia ([place]) 10/15/2019  . Diabetes mellitus, type II ([place])   . Dyslipidemia   . Stomach cancer ([place])      Family History  Problem Relation Age of Onset  . Early death Mother        childbirth  . Cancer Father   . Stomach cancer Sister 40  . Stroke Sister   . Diabetes Daughter      Social History   Socioeconomic History  . Marital status: Married    Spouse name: Not on file  . Number of children: Not on file  . Years of education: Not on file  . Highest education level: Not on file  Occupational History  . Not on file  Tobacco Use  . Smoking status: Former Smoker    Packs/day: 0.50  . Smokeless tobacco: Never Used  Vaping Use  . Vaping Use: Never used  Substance and Sexual Activity  . Alcohol use: Yes    Comment: occ  . Drug use: No  . Sexual activity: Not on file  Other Topics Concern  . Not on file  Social History Narrative  . Not on file   Social Determinants of Health   Financial Resource Strain:   . Difficulty of Paying Living Expenses:   Food Insecurity:   . Worried About Charity fundraiser in the Last Year:   . Arboriculturist in the Last Year:   Transportation Needs:   . Film/video editor (Medical):   Marland Kitchen Lack of Transportation (Non-Medical):   Physical Activity:   . Days of Exercise per Week:   . Minutes of Exercise per Session:   Stress:   . Feeling of Stress :   Social Connections:   . Frequency of Communication with Friends and Family:   . Frequency of Social Gatherings with Friends and Family:   . Attends Religious Services:   . Active Member of Clubs or Organizations:   . Attends Archivist Meetings:   Marland Kitchen Marital Status:   Intimate Partner Violence:   . Fear of Current or Ex-Partner:   . Emotionally Abused:   Marland Kitchen Physically Abused:   . Sexually Abused:      No Known Allergies    Outpatient Medications Prior to Visit  Medication Sig Dispense Refill  . Blood Glucose Monitoring Suppl (TRUE METRIX METER) w/Device KIT Use to measure blood sugar twice a day 1 kit 0  . glucose blood (TRUE METRIX BLOOD GLUCOSE TEST) test strip Use as instructed 100 each 12  . senna-docusate (SENOKOT-S) 8.6-50 MG tablet Take 1 tablet by mouth daily. 30 tablet 1  . sodium chloride (OCEAN) 0.65 % SOLN nasal spray Place 1 spray into both nostrils as needed for congestion. 30 mL 0  . TRUEplus Lancets 28G MISC Use to measure blood sugar twice a day 100 each 1  . albuterol (VENTOLIN HFA) 108 (90 Base) MCG/ACT inhaler Inhale 1-2 puffs into the lungs every 4 (four) hours as needed for wheezing or shortness of breath. 8 g 1  . atorvastatin (LIPITOR) 10 MG tablet Take 1 tablet (10 mg total) by mouth daily. 30 tablet 3  . metFORMIN (GLUCOPHAGE) 500 MG tablet Take 1 tablet (500 mg total) by mouth daily with breakfast.    . omeprazole (PRILOSEC) 20 MG capsule Take 1 capsule (20 mg total) by mouth daily. 30 capsule 4  . fluticasone-salmeterol (ADVAIR HFA) 230-21 MCG/ACT inhaler Inhale 2 puffs into the lungs 2 (two) times daily. (Patient not taking: Reported on 05/19/2020) 1 Inhaler 12  . simethicone (GAS-X) 80 MG chewable tablet Chew 1 tablet (80 mg total) by mouth every 6 (six) hours as needed for flatulence. (Patient not taking: Reported on 05/19/2020) 30 tablet 0   No facility-administered medications prior to visit.     Review of Systems Constitutional:    weight loss, night sweats,  Fevers, chills, fatigue, lassitude. HEENT:    headaches,  Difficulty swallowing,  Tooth/dental problems,  Sore throat,                No sneezing, itching, ear ache, nasal congestion, post nasal drip,   CV:   chest pain,  Orthopnea, PND, swelling in lower extremities, anasarca, dizziness, palpitations  GI   heartburn, indigestion, abdominal pain, nausea, vomiting, diarrhea, change in bowel habits, loss of  appetite  Resp:shortness of breath with exertion not at at rest.  No excess mucus, no productive cough,  No non-productive cough,  No coughing up of blood.  No change in color of mucus.  No wheezing.  No chest wall deformity  Skin: no rash or lesions.  GU: no dysuria, change in color of urine, no urgency or frequency.  No flank pain.  MS:  No joint pain or swelling.  No decreased range of motion.  No back pain.  Psych:  No change in mood or affect. No depression or anxiety.  No memory loss.     Objective:   Physical Exam Vitals:   05/19/20 0912  BP: (!) 151/87  Pulse: 62  Resp: 16  SpO2: 97%  Weight: 169 lb 6.4 oz (76.8 kg)    Gen: Pleasant, well-nourished, in no distress,  normal affect  ENT: No lesions,  mouth clear,  oropharynx clear, no postnasal drip  Neck: No JVD, no TMG, no carotid bruits  Lungs: No use of accessory muscles, no dullness to percussion, distant breath sounds with wheezes improved  Cardiovascular: RRR, heart sounds normal, no murmur or gallops, no peripheral edema  Abdomen: soft and NT, no HSM,  BS normal  Musculoskeletal: No deformities, no cyanosis or clubbing, slight purplish coloration to toes  Neuro: alert, non focal  Skin: Warm, no lesions or rashes  All labs reviewed from previous visit  oximetry check today showed on room air saturation 94% at rest does not drop below 89% RA with exertion    Assessment & Plan:  I personally reviewed all images and lab data in the [facility] system as well as any outside material available during this office visit and agree with the  radiology impressions.   Postinflammatory pulmonary fibrosis (HCC) Post pulmonary fibrosis in the setting of prior COVID-19 pneumonia  Post viral airway obstruction persisting  We will plan to obtain a CT scan of the chest for further imaging and refill Advair  Controlled type 2 diabetes mellitus ([place]) Type 2 diabetes under improved control we will continue Metformin  daily  Dyslipidemia Continue atorvastatin   Lavel was seen today for diabetes.  Diagnoses and all orders for this visit:  Controlled type 2 diabetes mellitus without complication, without long-term current use of insulin (HCC) -     POCT glucose (manual entry)  Diarrhea, unspecified type -     Campylobacter culture, stool -     Fecal occult blood, imunochemical -     CBC with Differential/Platelet  Postinflammatory pulmonary fibrosis (HCC) -     CT Chest Wo Contrast; Future  Epigastric pain -     Lipase  Interstitial pulmonary disease (HCC) -     CT Chest Wo Contrast; Future  History of COVID-19  Dyslipidemia  Other orders -     fluticasone-salmeterol (ADVAIR HFA) 230-21 MCG/ACT inhaler; Inhale 2 puffs into the lungs 2 (two) times daily. -     omeprazole (PRILOSEC) 40 MG capsule; Take 1 capsule (40 mg total) by mouth daily. -     atorvastatin (LIPITOR) 10 MG tablet; Take 1 tablet (10 mg total) by mouth daily. -     Discontinue: albuterol (VENTOLIN HFA) 108 (90 Base) MCG/ACT inhaler; Inhale 1-2 puffs into the lungs every 4 (four) hours as needed for wheezing or shortness of breath. -     metFORMIN (GLUCOPHAGE) 500 MG tablet; Take 1 tablet (500 mg total) by mouth daily with breakfast. -     olopatadine (PATANOL) 0.1 % ophthalmic solution; Place 1 drop into the left eye 2 (two) times daily. -     albuterol (VENTOLIN HFA) 108 (90 Base) MCG/ACT inhaler; Inhale 1-2 puffs into the lungs every 4 (four) hours as needed for wheezing or shortness of breath.

## 2020-05-19 NOTE — Assessment & Plan Note (Signed)
Continue atorvastatin

## 2020-05-20 LAB — CBC WITH DIFFERENTIAL/PLATELET
Basophils Absolute: 0 10*3/uL (ref 0.0–0.2)
Basos: 1 %
EOS (ABSOLUTE): 0.2 10*3/uL (ref 0.0–0.4)
Eos: 3 %
Hematocrit: 47.1 % (ref 37.5–51.0)
Hemoglobin: 15.6 g/dL (ref 13.0–17.7)
Immature Grans (Abs): 0 10*3/uL (ref 0.0–0.1)
Immature Granulocytes: 0 %
Lymphocytes Absolute: 1.5 10*3/uL (ref 0.7–3.1)
Lymphs: 30 %
MCH: 29.8 pg (ref 26.6–33.0)
MCHC: 33.1 g/dL (ref 31.5–35.7)
MCV: 90 fL (ref 79–97)
Monocytes Absolute: 0.5 10*3/uL (ref 0.1–0.9)
Monocytes: 9 %
Neutrophils Absolute: 2.9 10*3/uL (ref 1.4–7.0)
Neutrophils: 57 %
Platelets: 199 10*3/uL (ref 150–450)
RBC: 5.23 x10E6/uL (ref 4.14–5.80)
RDW: 13.9 % (ref 11.6–15.4)
WBC: 5.1 10*3/uL (ref 3.4–10.8)

## 2020-05-20 LAB — FECAL OCCULT BLOOD, IMMUNOCHEMICAL: Fecal Occult Bld: NEGATIVE

## 2020-05-20 LAB — LIPASE: Lipase: 32 U/L (ref 13–78)

## 2020-05-24 LAB — CAMPYLOBACTER CULTURE, STOOL

## 2020-06-03 ENCOUNTER — Ambulatory Visit (HOSPITAL_COMMUNITY)
Admission: RE | Admit: 2020-06-03 | Discharge: 2020-06-03 | Disposition: A | Discharge status: Home / Self Care | Payer: Self-pay | Source: Ambulatory Visit | Attending: Critical Care Medicine | Admitting: Critical Care Medicine

## 2020-06-03 ENCOUNTER — Other Ambulatory Visit: Payer: Self-pay

## 2020-06-03 DIAGNOSIS — J8410 Pulmonary fibrosis, unspecified: Secondary | ICD-10-CM

## 2020-06-03 DIAGNOSIS — J849 Interstitial pulmonary disease, unspecified: Secondary | ICD-10-CM

## 2020-06-07 ENCOUNTER — Telehealth: Payer: Self-pay | Admitting: Critical Care Medicine

## 2020-06-07 ENCOUNTER — Encounter: Payer: Self-pay | Admitting: Critical Care Medicine

## 2020-06-07 DIAGNOSIS — J8410 Pulmonary fibrosis, unspecified: Secondary | ICD-10-CM

## 2020-06-07 NOTE — Telephone Encounter (Signed)
I called the patient and let him know about his CT chest results  He has severe pulm fibrosis and bronchiectasis d/t his prior severe Covid PNA  I will refer him to Dr Vaughan Browner of pulmonary for any other thoughts

## 2020-06-22 ENCOUNTER — Ambulatory Visit: Payer: Self-pay | Attending: Critical Care Medicine | Admitting: Critical Care Medicine

## 2020-06-22 ENCOUNTER — Other Ambulatory Visit: Payer: Self-pay | Admitting: Critical Care Medicine

## 2020-06-22 ENCOUNTER — Encounter: Payer: Self-pay | Admitting: Critical Care Medicine

## 2020-06-22 ENCOUNTER — Other Ambulatory Visit: Payer: Self-pay

## 2020-06-22 VITALS — BP 142/93 | HR 66 | Temp 96.6°F | Resp 16 | Wt 172.0 lb

## 2020-06-22 DIAGNOSIS — J8410 Pulmonary fibrosis, unspecified: Secondary | ICD-10-CM

## 2020-06-22 DIAGNOSIS — N521 Erectile dysfunction due to diseases classified elsewhere: Secondary | ICD-10-CM

## 2020-06-22 DIAGNOSIS — I10 Essential (primary) hypertension: Secondary | ICD-10-CM | POA: Insufficient documentation

## 2020-06-22 DIAGNOSIS — E119 Type 2 diabetes mellitus without complications: Secondary | ICD-10-CM

## 2020-06-22 DIAGNOSIS — E785 Hyperlipidemia, unspecified: Secondary | ICD-10-CM

## 2020-06-22 DIAGNOSIS — Z8616 Personal history of COVID-19: Secondary | ICD-10-CM

## 2020-06-22 LAB — POCT CBG (FASTING - GLUCOSE)-MANUAL ENTRY: Glucose Fasting, POC: 170 mg/dL — AB (ref 70–99)

## 2020-06-22 MED ORDER — TADALAFIL 10 MG PO TABS
10.0000 mg | ORAL_TABLET | Freq: Every day | ORAL | 0 refills | Status: DC | PRN
Start: 2020-06-22 — End: 2020-11-11

## 2020-06-22 MED ORDER — AMLODIPINE BESYLATE 5 MG PO TABS: 5.0000 mg | ORAL_TABLET | Freq: Every day | ORAL | 3 refills | Status: DC

## 2020-06-22 MED FILL — AMLODIPINE BESYLATE 5 MG TA: 5 | 30 days supply | Qty: 30 | Fill #0

## 2020-06-22 MED FILL — TADALAFIL 10 MG TABS: 10 | 30 days supply | Qty: 10 | Fill #0

## 2020-06-22 NOTE — Assessment & Plan Note (Signed)
Patient continue Metformin daily and follow diet obtain eye exam which she agrees to do

## 2020-06-22 NOTE — Progress Notes (Signed)
One month f/u:   Refills on medications sent to pharmacy Resume advair two puff twice a day Use eye drops in left eye as ordered Follow diet below Obtain stool for analysis  Return Dr Joya Gaskins one month   Patient states he feels better

## 2020-06-22 NOTE — Patient Instructions (Addendum)
Start amlodipine one daily for elevated blood pressure Reduce advair to one puff twice a day Keep appointment when given to Dr Vaughan Browner of pulmonary  Get your covid booster end of Sept or early October [place] Please have your eyes examined Cialis given for erectile dysfunction Flu vaccine given today  See diet below for high blood pressure  Return Dr Joya Gaskins 3 months   Disfuncin erctil Erectile Dysfunction La disfuncin erctil (DE) es la incapacidad de lograr o Theatre manager una ereccin para Clinical biochemist. La disfuncin erctil puede implicar:  Imposibilidad de Sports administrator.  Falta de rigidez suficiente en el pene para permitir la penetracin.  Falta de ereccin antes de finalizar la relacin sexual. Cules son las causas? Esta afeccin puede ser causada por lo siguiente:  Ciertos medicamentos, como los siguientes: ? Analgsicos. ? Antihistamnicos. ? Antidepresivos. ? Medicamentos para la presin arterial. ? Medicamentos para eliminar los lquidos (diurticos). ? Medicamentos para la lcera. ? Relajantes musculares. ? Drogas.  Consumo excesivo de alcohol.  Causas psicolgicas como: ? Ansiedad. ? Depresin. ? Tristeza. ? Agotamiento. ? Temor al desempeo. ? Estrs.  Causas fsicas como: ? Problemas arteriales. Aqu se incluyen la diabetes, el hbito de fumar, enfermedad heptica o aterosclerosis. ? Hipertensin arterial. ? Problemas hormonales como en los casos de bajo nivel de [place]. ? Obesidad. ? Problemas neurolgicos. Se incluyen lesiones en la espalda o la pelvis, diabetes mellitus, esclerosis mltiple o enfermedad de Parkinson. Cules son los signos o los sntomas? Los sntomas de esta afeccin [employer] siguientes:  Imposibilidad de Sports administrator.  Falta de rigidez suficiente en el pene para permitir la penetracin.  Falta de ereccin antes de finalizar la relacin sexual.  A veces puede haber erecciones normales,  pero con frecuencia son episodios insatisfactorios.  Baja satisfaccin sexual en ambos miembros de la pareja debido a los problemas erctiles.  Con la ereccin el pene puede curvarse. Esto puede provocar dolor o la curvatura del pene puede ser demasiado pronunciada como para permitir el coito.  No tener nunca erecciones nocturnas. Cmo se diagnostica? Esta afeccin frecuentemente se diagnostica mediante:  Un examen fsico para descartar otras enfermedades o problemas especficos en el pene.  Preguntas detalladas acerca del problema.  Anlisis de sangre para descartar diabetes o para medir los niveles de hormonas.  Otros anlisis para Engineer, building services subyacentes.  Earl Lagos para descartar cicatrices.  Prueba de flujo sanguneo en el pene.  Estudio del sueo para medir las erecciones nocturnas. Cmo se trata? El tratamiento de esta afeccin puede incluir lo siguiente:  Medicamentos que se toman por boca para ayudar a Electrical engineer ereccin (medicamentos orales).  Terapia de reemplazo hormonal para reemplazar los bajos niveles de [place].  Medicamentos que se inyectan en el pene. El mdico puede ensearle a aplicarse estas inyecciones en su casa.  Bomba de vaco. Esta es una bomba con un anillo. La bomba y el anillo se colocan en el pene y se usan para crear una presin que ayuda a que el pene se ponga erecto.  Implantacin quirrgica de una prtesis de pene. En este procedimiento, puede recibir [employer] siguientes tipos de prtesis: ? Implante inflable. Consiste en cilindros, una bomba y un depsito. Los cilindros pueden inflarse con un lquido que ayuda a crear Radiation protection practitioner, y puede desinflarse despus del coito. ? Implante semirrgido. Esto [employer] varas de goma con silicona. Las varas proporcionan algo de rigidez. Tambin son flexibles, por lo que el pene puede curvarse [place]  en su posicin normal y volverse erecto para Administrator, arts.  Ciruga de vasos sanguneos para mejorar el flujo de sangre al pene. Durante este procedimiento, se coloca un vaso sanguneo de una parte diferente del cuerpo dentro del pene para permitir que la sangre fluya alrededor (derivacin o bypass) de los vasos sanguneos daados u obstruidos.  Cambios en el estilo de vida, como hacer ms ejercicio, perder peso y dejar de fumar. Siga estas indicaciones en su casa: Medicamentos   [employer] de venta libre y los recetados solamente como se lo haya indicado el mdico. No aumente la dosis sin analizarlo primero con su mdico.  Si se est aplicando inyecciones, aplqueselas segn las indicaciones de su mdico. Asegrese de evitar las venas que estn en la superficie del pene. Despus de aplicar una inyeccin, ejerza presin en el lugar de la inyeccin durante 72minutos. Instrucciones generales  Haga ejercicio regularmente como se lo haya indicado el mdico. Si debe perder peso, hable con su mdico.  No consuma ningn producto que contenga nicotina o tabaco, como cigarrillos y cigarrillos electrnicos. Si necesita ayuda para dejar de fumar, consulte al [employer].  Antes de usar una bomba de vaco, lea las instrucciones que vienen con la bomba y hgale a su mdico cualquier pregunta que tenga.  Concurra a todas las visitas de control como se lo haya indicado el mdico. Esto es importante. Comunquese con un mdico si:  Siente nuseas.  Vomita. Solicite ayuda de inmediato si:  Se administra medicamentos orales o inyectables y tiene una ereccin que dura ms de 4 horas. Si su mdico no est disponible, concurra al servicio de emergencias ms cercano para realizar una evaluacin. Una ereccin que dure ms de 4 horas puede dar como resultado un dao permanente en el pene.  Siente un dolor intenso en la ingle o el abdomen.  Presenta un enrojecimiento o hinchazn grave en el pene.  Tiene una zona colorada que se extiende hacia la  ingle o hacia la zona baja del abdomen.  No puede orinar.  Tiene dolor en el pecho o un latido cardaco rpido (palpitaciones) despus de tomar los [employer]. Resumen  La disfuncin erctil (DE) es la incapacidad de lograr o Theatre manager una ereccin durante las relaciones sexuales. Este problema generalmente se puede tratar con xito.  Esta afeccin se diagnostica en funcin de un examen fsico, los sntomas y anlisis para Office manager causa. El tratamiento vara segn la causa, y puede incluir medicamentos, terapia hormonal, ciruga o bomba de vaco.  Es posible que deba realizar visitas de seguimiento para asegurarse de que est usando sus medicamentos o dispositivos correctamente.  Obtenga ayuda de inmediato si se administra medicamentos orales o inyectables y tiene una ereccin que dura ms de 4 horas. Esta informacin no tiene Marine scientist el consejo del mdico. Asegrese de hacerle al mdico cualquier pregunta que tenga. Document Revised: 06/11/2017 Document Reviewed: 06/11/2017 Elsevier Patient Education  [place] pulmonar Pulmonary Fibrosis  La fibrosis pulmonar es un tipo de enfermedad que causa cicatrices. Con el tiempo, el tejido cicatricial se acumula en los sacos de aire de los pulmones (alvolos). Esto dificulta la respiracin. Puede ingresar menos oxgeno a Herbalist. Las cicatrices de la fibrosis pulmonar empeoran con el tiempo. Este dao es [place] y puede conducir a otros problemas de salud graves. Cules son las causas? Hay muchas causas diferentes de fibrosis pulmonar. Algunas veces, la causa no se conoce. En tales casos, se denomina fibrosis  pulmonar idioptica. Otras causas son:  Exposicin a determinadas sustancias y productos qumicos asociados con la agricultura o el trabajo en [place], una construccin o una fbrica. Por ejemplo, moho, amianto, slice, polvos de metales y vapores txicos.  Sarcoidosis. En esta  enfermedad, que suele afectar los pulmones, se forman reas de clulas inflamatorias (granulomas).  Enfermedades autoinmunitarias. Entre ellas, se incluyen la artritis reumatoide, la esclerosis sistmica o las enfermedades del tejido conjuntivo.  Tomar ciertos medicamentos. Estos incluyen los medicamentos que se usan en radioterapia o en el tratamiento de convulsiones, problemas cardacos y algunas infecciones. Qu incrementa el riesgo? Es ms probable que usted sufra esta afeccin si:  Tiene antecedentes familiares de la enfermedad.  Es Ardelia Mems persona de edad avanzada. La afeccin es ms frecuente en los [employer].  Tiene antecedentes de tabaquismo.  Tiene un trabajo que lo expone a ciertas sustancias qumicas.  Tiene enfermedad de reflujo gastroesofgico (ERGE). Cules son los signos o los sntomas? Los sntomas de esta afeccin [employer] siguientes:  Dificultad para respirar, que empeora con la [place].  Falta de aire (disnea).  Tos seca, persistente.  Respiracin rpida y superficial durante el ejercicio o al hacer reposo.  Piel y labios azulados.  Prdida del apetito.  Debilidad.  Prdida de peso y [place].  Puntas de los dedos redondeadas y de mayor tamao (dedos en palillo de tambor). Cmo se diagnostica? Esta afeccin se puede diagnosticar en funcin de lo siguiente:  Los sntomas y antecedentes mdicos.  Un examen fsico. Tambin pueden hacerle estudios, que incluyen los siguientes:  Un examen que implica observar el interior de los pulmones con un instrumento (broncoscopia).  Estudios de diagnstico por imgenes de los pulmones y Film/video editor.  Estudios para verificar si est respirando bien (pruebas de la funcin pulmonar).  Anlisis de [place].  Pruebas para determinar cmo funcionan los pulmones mientras camina (ergometra pulmonar).  Un procedimiento para extirpar Truddie Coco de tejido pulmonar para examinar con un microscopio  (biopsia). Cmo se trata? La fibrosis pulmonar no tiene [place]. El tratamiento se centra en controlar los sntomas y evitar que el tejido cicatricial empeore. Esto puede incluir lo siguiente:  Medicamentos, por ejemplo: ? Corticoesteroides para evitar cambios permanentes en el pulmn. ? Medicamentos para Corporate treasurer de defensa (sistema inmunitario) del cuerpo. ? Medicamentos para ayudar con la funcin pulmonar al reducir la inflamacin o la formacin de tejido cicatricial.  Controles peridicos con radiografas y anlisis de laboratorio.  Oxigenoterapia.  Rehabilitacin pulmonar.  Ciruga. En algunos casos, es posible un trasplante de pulmn. Siga estas indicaciones en su casa:     Medicamentos  Tome los medicamentos de venta libre y los recetados solamente como se lo haya indicado el mdico.  Mantngase al da con las vacunas como se lo haya recomendado el mdico. Instrucciones generales  No consuma ningn producto que contenga nicotina o tabaco, como cigarrillos y Psychologist, sport and exercise. Si necesita ayuda para dejar de fumar, consulte al mdico.  Haga ejercicio de forma regular, pero no haga esfuerzos excesivos. Pdale al mdico que le sugiera algunas actividades que sean seguras para usted. ? Si tiene limitaciones fsicas, puede hacer ejercicio caminando, usando una bicicleta fija o realizando ejercicios en una silla. ? Consulte a su mdico acerca de la necesidad de usar oxgeno [employer] ejercicios.  Si est expuesto a sustancias y productos qumicos en el trabajo, asegrese de usar una mscara o un respirador en todo momento.  nase a un programa de rehabilitacin pulmonar o a un  grupo de apoyo para personas con fibrosis pulmonar.  Coma porciones pequeas varias veces al da, para no llenarse demasiado. [place] en exceso puede dificultar ms la respiracin.  Mantenga un peso saludable. Pierda peso si lo necesita.  Haga ejercicios de Education officer, community se  lo haya indicado el mdico.  Concurra a todas las visitas de control como se lo haya indicado el mdico. Esto es importante. Comunquese con un mdico si:  Tiene sntomas que no mejoran con los medicamentos.  No puede mantener su nivel de actividad habitual.  Tiene dificultad para respirar profundo.  Tiene fiebre o escalofros.  Los labios o la piel se le ponen de [employer].  Tiene las puntas de los dedos redondeadas y de mayor tamao. Solicite ayuda inmediatamente si:  Los sntomas empeoran de [place] repentina.  Siente dolor en el pecho.  Tose y expectora una mucosidad de color oscuro.  Tiene muchos dolores de [place].  Est muy confundido o somnoliento. Resumen  La fibrosis pulmonar es un tipo de enfermedad pulmonar que causa que, con el [place], se acumule tejido cicatricial en los sacos de aire de los pulmones (alvolos). Puede ingresar menos oxgeno a Herbalist. Esto dificulta la respiracin.  Las cicatrices de la fibrosis pulmonar empeoran con el tiempo. Este dao es [place] y puede conducir a otros problemas de salud graves.  Una persona es ms propensa a sufrir esta afeccin si tiene antecedentes familiares de la afeccin o un trabajo que la expone a ciertas sustancias qumicas.  La fibrosis pulmonar no tiene [place]. El tratamiento se centra en controlar los sntomas y evitar que el tejido cicatricial empeore. Esta informacin no tiene Marine scientist el consejo del mdico. Asegrese de hacerle al mdico cualquier pregunta que tenga. Document Revised: 12/14/2017 Document Reviewed: 12/14/2017 Elsevier Patient Education  [place].  Hipertensin en los adultos Hypertension, Adult La presin arterial alta (hipertensin) se produce cuando la fuerza de la sangre bombea a travs de las arterias con mucha fuerza. Las arterias son los vasos sanguneos que transportan la sangre desde el corazn al resto del cuerpo. La hipertensin hace que el corazn haga ms  esfuerzo para Chiropodist y [employer] que las arterias se Teacher, music o Advertising account executive. La hipertensin no tratada o no controlada puede causar infarto de miocardio, insuficiencia cardaca, accidente cerebrovascular, enfermedad renal y otros problemas. Una lectura de la presin arterial consta de un nmero ms alto sobre un nmero ms bajo. En condiciones ideales, la presin arterial debe estar por debajo de 120/80. El primer nmero ("superior") es la presin sistlica. Es la medida de la presin de las arterias cuando el corazn late. El segundo nmero ("inferior") es la presin diastlica. Es la medida de la presin en las arterias cuando el corazn se relaja. Cules son las causas? Se desconoce la causa exacta de esta afeccin. Hay algunas afecciones que causan presin arterial alta o estn relacionadas con ella. Qu incrementa el riesgo? Algunos factores de riesgo de hipertensin estn bajo su control. Los siguientes factores pueden hacer que sea ms propenso a Armed forces training and education officer afeccin:  Fumar.  Tener diabetes mellitus tipo 2, colesterol alto, o ambos.  No hacer la cantidad suficiente de actividad fsica o ejercicio.  Tener sobrepeso.  Consumir mucha grasa, azcar, caloras o sal (sodio) en su dieta.  Beber alcohol en exceso. Algunos factores de riesgo para la presin arterial alta pueden ser difciles o imposibles de [place] manager. Algunos de estos factores son los siguientes:  Tener enfermedad renal crnica.  Tener antecedentes  familiares de presin arterial alta.  Edad. Los riesgos aumentan con la edad.  [place]. El riesgo es mayor para las Retail banker.  Sexo. Antes de los 45aos, los hombres corren ms [employer]. Despus de los 65aos, las mujeres corren ms [employer].  Tener apnea obstructiva del sueo.  Estrs. Cules son los signos o los sntomas? Es posible que la presin arterial alta puede no cause sntomas. La presin arterial muy  alta (crisis hipertensiva) puede provocar:  Dolor de cabeza.  Ansiedad.  Falta de aire.  Hemorragia nasal.  Nuseas y vmitos.  Cambios en la visin.  Dolor de pecho intenso.  Convulsiones. Cmo se diagnostica? Esta afeccin se diagnostica al medir su presin arterial mientras se encuentra sentado, con el brazo apoyado sobre una superficie plana, las piernas sin cruzar y los pies bien apoyados en el piso. El brazalete del tensimetro debe colocarse directamente sobre la piel de la parte superior del brazo y al nivel de su corazn. Debe medirla al [facility] veces en el mismo brazo. Determinadas condiciones pueden causar una diferencia de presin arterial entre el brazo izquierdo y Insurance underwriter. Ciertos factores pueden provocar que las lecturas de la presin arterial sean inferiores o superiores a lo normal por un perodo corto de tiempo:  Si su presin arterial es ms alta cuando se encuentra en el consultorio del mdico que cuando la mide en su hogar, se denomina "hipertensin de bata blanca". La [employer] de las personas que tienen esta afeccin no deben ser [employer].  Si su presin arterial es ms alta en el hogar que cuando se encuentra en el consultorio del mdico, se denomina "hipertensin enmascarada". La [employer] de las personas que tienen esta afeccin deben ser medicadas para Chief Technology Officer la presin arterial. Si tiene una lecturas de presin arterial alta durante una visita o si tiene presin arterial normal con otros factores de riesgo, se le podr pedir que haga lo siguiente:  Que regrese otro da para volver a Chief Technology Officer su presin arterial nuevamente.  Que se controle la presin arterial en su casa durante 1 semana o ms. Si se le diagnostica hipertensin, es posible que se le realicen otros anlisis de sangre o estudios de diagnstico por imgenes para ayudar a su mdico a comprender su riesgo general de tener otras afecciones. Cmo se trata? Esta afeccin se trata haciendo  cambios saludables en el estilo de vida, tales como ingerir alimentos saludables, realizar ms ejercicio y reducir el consumo de alcohol. El mdico puede recetarle medicamentos si los cambios en el estilo de vida no son suficientes para Child psychotherapist la presin arterial y si:  Su presin arterial sistlica est por encima de 130.  Su presin arterial diastlica est por encima de 80. La presin arterial deseada puede variar en funcin de las enfermedades, la edad y otros factores personales. Siga estas instrucciones en su casa: Comida y bebida   Siga una dieta con alto contenido de fibras y [place], y con bajo contenido de sodio, Location manager agregada y Physicist, medical. Un ejemplo de plan alimenticio es la dieta DASH (Dietary Approaches to Stop Hypertension, Mtodos alimenticios para detener la hipertensin). Para alimentarse de esta manera: ? Coma mucha fruta y [place]. Trate de que la mitad del plato de cada comida sea de frutas y verduras. ? Coma cereales integrales, como pasta integral, arroz integral o pan integral. Llene aproximadamente un cuarto del plato con cereales integrales. ? Coma y beba productos lcteos con bajo contenido de [place], [place]  leche descremada o yogur bajo en grasas. ? Evite la ingesta de cortes de carne grasa, carne procesada o curada, y carne de ave con piel. Llene aproximadamente un cuarto del plato con protenas magras, como pescado, pollo sin piel, frijoles, huevos o tofu. ? Evite ingerir alimentos prehechos y procesados. En general, estos tienen mayor cantidad de sodio, azcar agregada y Wendee Copp.  Reduzca su ingesta diaria de sodio. La mayora de las personas que tienen hipertensin deben comer menos de 1500 mg de sodio por [employer].  No beba alcohol si: ? Su mdico le indica no hacerlo. ? Est embarazada, puede estar embarazada o est tratando de quedar embarazada.  Si bebe alcohol: ? Limite la cantidad que bebe a lo siguiente:  De 0 a 1 medida por da para las  mujeres.  De 0 a 2 medidas por da para los hombres. ? Est atento a la cantidad de alcohol que hay en las bebidas que toma. En los [place], una medida equivale a una botella de cerveza de 12oz (360ml), un vaso de vino de 5oz (152ml) o un vaso de una bebida alcohlica de alta graduacin de 1oz (56ml). Estilo de vida   Trabaje con su mdico para mantener un peso saludable o Administrator, Civil Service. Pregntele cul es el peso recomendado para usted.  Haga al menos 44minutos de ejercicio la [employer] de la [place]. Estas actividades pueden incluir caminar, nadar o andar en bicicleta.  Incluya ejercicios para fortalecer sus msculos (ejercicios de resistencia), como [place] o levantamiento de pesas, como parte de su rutina semanal de ejercicios. Intente realizar 34minutos de este tipo de ejercicios al [employer] a la [place].  No consuma ningn producto que contenga nicotina o tabaco, como cigarrillos, cigarrillos electrnicos y tabaco de Higher education careers adviser. Si necesita ayuda para dejar de fumar, consulte al mdico.  Contrlese la presin arterial en su casa segn las indicaciones del mdico.  Concurra a todas las visitas de seguimiento como se lo haya indicado el mdico. Esto es importante. Medicamentos  [employer] de venta libre y los recetados solamente como se lo haya indicado el mdico. Siga cuidadosamente las indicaciones. Los medicamentos para la presin arterial deben tomarse segn las indicaciones.  No omita las dosis de medicamentos para la presin arterial. Si lo hace, estar en riesgo de tener problemas y puede hacer que los medicamentos sean menos eficaces.  Pregntele a su mdico a qu efectos secundarios o reacciones a los Careers information officer. Comunquese con un mdico si:  Piensa que tiene una reaccin a un medicamento que est tomando.  Tiene dolores de cabeza frecuentes (recurrentes).  Se siente mareado.  Tiene hinchazn en los  tobillos.  Tiene problemas de visin. Solicite ayuda inmediatamente si:  Siente un dolor de cabeza intenso o confusin.  Siente debilidad inusual o adormecimiento.  Siente que va a desmayarse.  Siente un dolor intenso en el pecho o el abdomen.  Vomita repetidas veces.  Tiene dificultad para respirar. Resumen  La hipertensin se produce cuando la sangre bombea en las arterias con mucha fuerza. Si esta afeccin no se controla, podra correr riesgo de tener complicaciones graves.  La presin arterial deseada puede variar en funcin de las enfermedades, la edad y otros factores personales. Para la [employer], una presin arterial normal es menor que 120/80.  La hipertensin se trata con cambios en el estilo de vida, medicamentos o una combinacin de [place]. Los [employer] Corporation estilo de vida incluyen prdida de  peso, ingerir alimentos sanos, seguir una dieta baja en sodio, hacer ms ejercicio y limitar el consumo de alcohol. Esta informacin no tiene Marine scientist el consejo del mdico. Asegrese de hacerle al mdico cualquier pregunta que tenga. Document Revised: 08/01/2018 Document Reviewed: 08/01/2018 Elsevier Patient Education  [place].

## 2020-06-22 NOTE — Assessment & Plan Note (Signed)
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

## 2020-06-22 NOTE — Progress Notes (Signed)
Subjective:    Patient ID: Walter Phillips, male    DOB: 1961/09/02, 59 y.o.   MRN: [id number]  11/10/19 :   59 year old male [place] who was admitted for Covid pneumonia between the 12th and 24 December.  He had significant hypoxemia and ultimately had to be discharged on oxygen 2 L rest 4 L exertion.  This is a post hospital visit.  Below is the discharge summary.  Admit date:10/15/2019 Discharge date:10/23/2019  Discharge summary: 58 year old gentleman with history of type 2 diabetes on Metformin, hyperlipidemia presented to the emergency room with complaining of shortness of breath. In the emergency room he was tachypneic, 88% on room air, initially placed on nonrebreather. He was admitted to the hospital with COVID-19 pneumonia.  Acute hypoxemic respiratory failure secondary to COVID-19 pneumonia: He completed 5 days of remdesivir therapy, he was started on steroid as outpatient, finished about 10 days of steroid therapy. Patient also received a dose of convalescent plasma and dose of Actemra on admission. His clinical status has improved, however he still requires supplemental oxygen. Has improved from acute phase of infection, however remains persistently hypoxic. Patient will need to go home on oxygen and follow-up outpatient for subsequent weaning off the oxygen.  His blood sugars are elevated, hemoglobin A1c 7.9. He is uncomfortable using insulin at home. Since now he is going to stop further steroids, he will just resume Metformin and control his diet.  Discharge Diagnoses: Principal Problem: Sepsis ([place]) Active Problems: Pneumonia due to COVID-19 virus Acute respiratory failure with hypoxia ([place]) Dyslipidemia Uncontrolled type 2 diabetes mellitus with hyperglycemia ([place])   11/10/2019 This is a return follow-up visit for this 59 year old male who was admitted in December for Covid pneumonia.  He had diffuse bilateral infiltrates and low lung volumes and  required oxygen therapy on discharge.  I did a telephone visit with him last week and our plan was to get him into the office today for further evaluation.  Nevertheless he went back to the emergency room on 8 January and they discharged him without any changes in plan of care except for the prescription of an albuterol inhaler.  His oxygen saturation on room air was normal at that visit.  Chest x-ray is repeated and still shows bilateral chronic infiltrates.  The patient states he has chest discomfort, chest tightness, wheezing, cough productive of clear mucus, no fever, weakness and fatigue, muscle aches, headaches, sinus congestion noted as well.  Patient also notes nausea and abdominal tenderness and no emesis.  He has no appetite.  He does have excess gas at this time.  We did give him prescriptions for omeprazole simple saline simethicone at the last visit.  Patient also was given a prescription for Metformin which he is taking 1 daily.  He does not have any glucose testing supplies at this time.  Note today's glucose is 123  The patient works as a Mudlogger  12/02/2019 Note this visit was face-to-face and we used [place] Since the last visit the patient's breathing has improved.  He is off oxygen during the day.  He has no cough.  The patient still experiences some chest pain.  He is taking the omeprazole daily.  He notes less gas on the simethicone.  The patient did finish his course of prednisone.  He does maintain the Advair twice daily along with vitamin D and C supplementations he is now off zinc. Note CT angiogram showed persistent scarring and groundglass inflammation in the lung  but no pulmonary emboli seen  Patient's blood sugars are under good control with 95-100 range fasting and up to 150s in the afternoons on the once daily Metformin 500 mg  Note liver functions were improving hepatitis C assay negative urine for microalbumin  negative  01/13/2020 [name] Study is a 59 year old male with a history of type 2 diabetes mellitus, hyperlipidemia, COVID-19 infection (hospitalized 10/11/2019 through 10/23/2019), and pulmonary fibrosis who presents today for a follow up visit.  He no longer requires supplemental oxygen and his respiratory symptoms are improving.  He is back at work and has noted that as he starts to work faster he notices occasional shortness of breath.  During these episodes, he sits down for a few minutes and the symptoms resolve spontaneously.  He has chest tightness with deep inspiration.  He is using his rescue inhaler 1-2 times per week.  Denies cough, chest pain, SOB at rest.  Walking test revealed lowest oxygen saturation of 94-95% on room air with no dyspnea.    He occasionally checks his fasting blood sugar and it is usually in the 110's.  Endorses compliance with medication and a diabetic diet.  Today he mentions that he is occasionally experiencing hard stools and he hasn't taken anything to relieve symptoms.  The patient notes occasional chest tightness when he takes a deep breath.  There is no cough.  There is no lower extremity edema.  The patient no longer is using oxygen yet the equipment is still in the home. The patient's run out of his Senokot-S and still is having some constipation.  He has yet to achieve an eye exam for his diabetes  He is due a Pneumovax 23 valent and tetanus vaccine he agrees to take these  He also intends to obtain the Covid vaccine when it becomes available for his group  05/19/2020 This visit was assisted with Spanish interpreter [name] [phone] Patient is seen in return follow-up notes cough and dyspnea is improved but he still has episodes of shortness of breath and chest tightness. Note on arrival his hemoglobin A1c is 6.3 he has had the Covid vaccine.  If he works and exerts himself he gets short of breath and has to take a break.  The patient is out of his  Advair he does state the albuterol helps  06/22/2020 The patient returns today in follow-up of post Covid pulmonary fibrosis.  The patient is on Advair and states this does help with his dyspnea however at times when he uses the Advair he becomes somewhat agitated due to side effects  The patient has no real cough at this time his level of fatigue has markedly improved.  Patient does have type 2 diabetes in a row on arrival his blood sugar glucose of 170.  He is maintaining the Metformin at a dose of 500 mg daily.  He has yet to have an eye exam formed.  He does maintain atorvastatin 10 mg daily.  The patient comes in with elevated blood pressure 142/93 and is not on antihypertensives at this time.  The patient does not have headaches chest pain or shortness of breath.  The patient also complains of erectile dysfunction and is wishing a prescription for Viagra-like medication   Past Medical History:  Diagnosis Date  . Cancer ([place])   . Chronic respiratory failure with hypoxia ([place]) 10/15/2019  . Diabetes mellitus, type II ([place])   . Dyslipidemia   . 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 Early death Mother        childbirth  . Cancer Father   . Stomach cancer Sister 47  . Stroke Sister   . Diabetes Daughter      Social History   Socioeconomic History  . Marital status: Married    Spouse name: Not on file  . Number of children: Not on file  . Years of education: Not on file  . Highest education level: Not on file  Occupational History  . Not on file  Tobacco Use  . Smoking status: Former Smoker    Packs/day: 0.50  . Smokeless tobacco: Never Used  Vaping Use  . Vaping Use: Never used  Substance and Sexual Activity  . Alcohol use: Yes    Comment: occ  . Drug use: No  . Sexual activity: Not on file  Other Topics Concern  . Not on file  Social History Narrative  . Not on file   Social Determinants of Health   Financial Resource  Strain:   . Difficulty of Paying Living Expenses: Not on file  Food Insecurity:   . Worried About Charity fundraiser in the Last Year: Not on file  . Ran Out of Food in the Last Year: Not on file  Transportation Needs:   . Lack of Transportation (Medical): Not on file  . Lack of Transportation (Non-Medical): Not on file  Physical Activity:   . Days of Exercise per Week: Not on file  . Minutes of Exercise per Session: Not on file  Stress:   . Feeling of Stress : Not on file  Social Connections:   . Frequency of Communication with Friends and Family: Not on file  . Frequency of Social Gatherings with Friends and Family: Not on file  . Attends Religious Services: Not on file  . Active Member of Clubs or Organizations: Not on file  . Attends Archivist Meetings: Not on file  . Marital Status: Not on file  Intimate Partner Violence:   . Fear of Current or Ex-Partner: Not on file  . Emotionally Abused: Not on file  . Physically Abused: Not on file  . Sexually Abused: Not on file     No Known Allergies   Outpatient Medications Prior to Visit  Medication Sig Dispense Refill  . albuterol (VENTOLIN HFA) 108 (90 Base) MCG/ACT inhaler Inhale 1-2 puffs into the lungs every 4 (four) hours as needed for wheezing or shortness of breath. 8 g 1  . atorvastatin (LIPITOR) 10 MG tablet Take 1 tablet (10 mg total) by mouth daily. 30 tablet 3  . fluticasone-salmeterol (ADVAIR HFA) 230-21 MCG/ACT inhaler Inhale 2 puffs into the lungs 2 (two) times daily. 1 Inhaler 12  . metFORMIN (GLUCOPHAGE) 500 MG tablet Take 1 tablet (500 mg total) by mouth daily with breakfast. 60 tablet 3  . olopatadine (PATANOL) 0.1 % ophthalmic solution Place 1 drop into the left eye 2 (two) times daily. 5 mL 0  . omeprazole (PRILOSEC) 40 MG capsule Take 1 capsule (40 mg total) by mouth daily. 30 capsule 3  . Blood Glucose Monitoring Suppl (TRUE METRIX METER) w/Device KIT Use to measure blood sugar twice a day 1 kit 0   . glucose blood (TRUE METRIX BLOOD GLUCOSE TEST) test strip Use as instructed 100 each 12  . senna-docusate (SENOKOT-S) 8.6-50 MG tablet Take 1 tablet by mouth daily. (Patient not taking: Reported on 06/22/2020) 30 tablet 1  . sodium chloride (  OCEAN) 0.65 % SOLN nasal spray Place 1 spray into both nostrils as needed for congestion. (Patient not taking: Reported on 06/22/2020) 30 mL 0  . TRUEplus Lancets 28G MISC Use to measure blood sugar twice a day 100 each 1   No facility-administered medications prior to visit.     Review of Systems  Constitutional: Negative for activity change and fatigue.  HENT: Negative.   Respiratory: Positive for cough. Negative for apnea, choking, chest tightness, shortness of breath, wheezing and stridor.   Cardiovascular: Negative for chest pain, palpitations and leg swelling.  Gastrointestinal: Negative.   Genitourinary: Negative.   Musculoskeletal: Negative.   Neurological: Negative.   Hematological: Negative.   Psychiatric/Behavioral: Negative.    C    Objective:   Physical Exam Vitals:   06/22/20 0840  BP: (!) 142/93  Pulse: 66  Resp: 16  Temp: (!) 96.6 F (35.9 C)  SpO2: 97%  Weight: 172 lb (78 kg)    Gen: Pleasant, well-nourished, in no distress,  normal affect  ENT: No lesions,  mouth clear,  oropharynx clear, no postnasal drip  Neck: No JVD, no TMG, no carotid bruits  Lungs: No use of accessory muscles, no dullness to percussion, distant breath sounds with wheezes improved, dry rales at the base of the left lower lobe  Cardiovascular: RRR, heart sounds normal, no murmur or gallops, no peripheral edema  Abdomen: soft and NT, no HSM,  BS normal  Musculoskeletal: No deformities, no cyanosis or clubbing, slight purplish coloration to toes  Neuro: alert, non focal  Skin: Warm, no lesions or rashes  All labs reviewed from previous visit    Assessment & Plan:  I personally reviewed all images and lab data in the [facility] system as well  as any outside material available during this office visit and agree with the  radiology impressions.   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  Diagnoses and all orders for this visit:  Controlled type 2 diabetes mellitus without complication, without long-term current use of insulin (HCC) -     Glucose (CBG), Fasting  Essential hypertension  History of COVID-19  Dyslipidemia  Postinflammatory pulmonary fibrosis (HCC)  Erectile dysfunction and diabetes  Other orders -     amLODipine (NORVASC) 5 MG tablet; Take 1 tablet (5 mg total) by mouth daily. -     tadalafil (CIALIS) 10 MG tablet; Take 1 tablet (10 mg total) by mouth daily as needed for erectile dysfunction.   A flu vaccine was given at this visit

## 2020-06-22 NOTE — Assessment & Plan Note (Signed)
Erectile dysfunction likely secondary to diabetes  Will prescribe Cialis 10 mg prior to intercourse as needed

## 2020-06-22 NOTE — Assessment & Plan Note (Signed)
Elevated blood pressure readings are consistent and will  call him hypertensive  Plan is to begin amlodipine 5 mg daily and I gave the patient a hypertension diet

## 2020-08-16 ENCOUNTER — Other Ambulatory Visit: Payer: Self-pay | Admitting: Critical Care Medicine

## 2020-08-16 DIAGNOSIS — J8410 Pulmonary fibrosis, unspecified: Secondary | ICD-10-CM

## 2020-08-16 NOTE — Progress Notes (Signed)
pft  

## 2020-08-17 ENCOUNTER — Other Ambulatory Visit: Payer: Self-pay

## 2020-08-17 ENCOUNTER — Encounter: Payer: Self-pay | Admitting: Pulmonary Disease

## 2020-08-17 ENCOUNTER — Ambulatory Visit (INDEPENDENT_AMBULATORY_CARE_PROVIDER_SITE_OTHER): Payer: Self-pay | Admitting: Pulmonary Disease

## 2020-08-17 VITALS — BP 138/76 | HR 65 | Temp 97.2°F | Ht 62.0 in | Wt 168.8 lb

## 2020-08-17 DIAGNOSIS — U099 Post covid-19 condition, unspecified: Secondary | ICD-10-CM

## 2020-08-17 DIAGNOSIS — J8410 Pulmonary fibrosis, unspecified: Secondary | ICD-10-CM

## 2020-08-17 LAB — PULMONARY FUNCTION TEST
DL/VA % pred: 101 %
DL/VA: 4.44 ml/min/mmHg/L
DLCO cor % pred: 95 %
DLCO cor: 20.19 ml/min/mmHg
DLCO unc % pred: 95 %
DLCO unc: 20.19 ml/min/mmHg
FEF 25-75 Post: 2.72 L/sec
FEF 25-75 Pre: 2.5 L/sec
FEF2575-%Change-Post: 8 %
FEF2575-%Pred-Post: 122 %
FEF2575-%Pred-Pre: 112 %
FEV1-%Change-Post: 1 %
FEV1-%Pred-Post: 99 %
FEV1-%Pred-Pre: 98 %
FEV1-Post: 2.61 L
FEV1-Pre: 2.57 L
FEV1FVC-%Change-Post: -2 %
FEV1FVC-%Pred-Pre: 107 %
FEV6-%Change-Post: 3 %
FEV6-%Pred-Post: 100 %
FEV6-%Pred-Pre: 96 %
FEV6-Post: 3.3 L
FEV6-Pre: 3.17 L
FEV6FVC-%Change-Post: 0 %
FEV6FVC-%Pred-Post: 105 %
FEV6FVC-%Pred-Pre: 105 %
FVC-%Change-Post: 3 %
FVC-%Pred-Post: 95 %
FVC-%Pred-Pre: 91 %
FVC-Post: 3.3 L
FVC-Pre: 3.17 L
Post FEV1/FVC ratio: 79 %
Post FEV6/FVC ratio: 100 %
Pre FEV1/FVC ratio: 81 %
Pre FEV6/FVC Ratio: 100 %
RV % pred: 142 %
RV: 2.6 L
TLC % pred: 110 %
TLC: 5.95 L

## 2020-08-17 NOTE — Progress Notes (Signed)
Walter Phillips    [id number]    10/19/1961  Primary Care Physician:Wright, Burnett Harry, MD  Referring Physician: Elsie Stain, MD [address],   [id number]  Chief complaint: Consult for post COVID-19 fibrosis  HPI: 59 year old with diabetes, hyperlipidemia, stomach cancer Admitted for COVID-19 pneumonia in December 2020.  Treated with remdesivir, steroids, convalescent plasma and Actemra.  He required high flow nasal cannula but was not intubated.  He was treated with steroids as an inpatient and finished about 10 days taper and discharged on supplemental oxygen  Post discharge he is stable himself off oxygen.  Follow-up CT earlier this year showed post Covid fibrotic changes and has been referred here for further evaluation He got PFTs today which showed normal lung function  Overall he feels about 80% recovered but still has some mild dyspnea on exertion.  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  No ongoing exposures.  Denies mold, hot tub, Jacuzzi.  No feather pillows or comforters Smoking history: Minimal smoking history.  Quit in 2000 Travel history: Immigrated from [place] in 1988.  Previously lived in [place].  No significant recent travel Relevant family history: No significant family history of lung disease  Outpatient Encounter Medications as of 08/17/2020  Medication Sig  . albuterol (VENTOLIN HFA) 108 (90 Base) MCG/ACT inhaler Inhale 1-2 puffs into the lungs every 4 (four) hours as needed for wheezing or shortness of breath.  Marland Kitchen amLODipine (NORVASC) 5 MG tablet Take 1 tablet (5 mg total) by mouth daily.  Marland Kitchen atorvastatin (LIPITOR) 10 MG tablet Take 1 tablet (10 mg total) by mouth daily.  . Blood Glucose Monitoring Suppl (TRUE METRIX METER) w/Device KIT Use to measure blood sugar twice a day  . fluticasone-salmeterol  (ADVAIR HFA) 230-21 MCG/ACT inhaler Inhale 2 puffs into the lungs 2 (two) times daily.  Marland Kitchen glucose blood (TRUE METRIX BLOOD GLUCOSE TEST) test strip Use as instructed  . metFORMIN (GLUCOPHAGE) 500 MG tablet Take 1 tablet (500 mg total) by mouth daily with breakfast.  . olopatadine (PATANOL) 0.1 % ophthalmic solution Place 1 drop into the left eye 2 (two) times daily.  Marland Kitchen omeprazole (PRILOSEC) 40 MG capsule Take 1 capsule (40 mg total) by mouth daily.  Marland Kitchen senna-docusate (SENOKOT-S) 8.6-50 MG tablet Take 1 tablet by mouth daily.  . sodium chloride (OCEAN) 0.65 % SOLN nasal spray Place 1 spray into both nostrils as needed for congestion.  . tadalafil (CIALIS) 10 MG tablet Take 1 tablet (10 mg total) by mouth daily as needed for erectile dysfunction.  . TRUEplus Lancets 28G MISC Use to measure blood sugar twice a day   No facility-administered encounter medications on file as of 08/17/2020.    Allergies as of 08/17/2020  . (No Known Allergies)    Past Medical History:  Diagnosis Date  . Cancer ([place])   . Chronic respiratory failure with hypoxia ([place]) 10/15/2019  . Diabetes mellitus, type II ([place])   . Dyslipidemia   . 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 APPENDECTOMY    . LAPAROSCOPIC CHOLECYSTECTOMY    . LAPAROSCOPIC SIGMOID COLECTOMY    . VENTRAL HERNIA REPAIR  05/27/2014    Family History  Problem Relation Age of Onset  . Early death Mother  childbirth  . Cancer Father   . Stomach cancer Sister 51  . Stroke Sister   . Diabetes Daughter     Social History   Socioeconomic History  . Marital status: Married    Spouse name: Not on file  . Number of children: Not on file  . Years of education: Not on file  . Highest education level: Not on file  Occupational History  . Not on file  Tobacco Use  . Smoking status: Former Smoker    Packs/day: 0.50  . Smokeless tobacco: Never Used  Vaping Use  . Vaping Use: Never used    Substance and Sexual Activity  . Alcohol use: Yes    Comment: occ  . Drug use: No  . Sexual activity: Not on file  Other Topics Concern  . Not on file  Social History Narrative  . Not on file   Social Determinants of Health   Financial Resource Strain:   . Difficulty of Paying Living Expenses: Not on file  Food Insecurity:   . Worried About Charity fundraiser in the Last Year: Not on file  . Ran Out of Food in the Last Year: Not on file  Transportation Needs:   . Lack of Transportation (Medical): Not on file  . Lack of Transportation (Non-Medical): Not on file  Physical Activity:   . Days of Exercise per Week: Not on file  . Minutes of Exercise per Session: Not on file  Stress:   . Feeling of Stress : Not on file  Social Connections:   . Frequency of Communication with Friends and Family: Not on file  . Frequency of Social Gatherings with Friends and Family: Not on file  . Attends Religious Services: Not on file  . Active Member of Clubs or Organizations: Not on file  . Attends Archivist Meetings: Not on file  . Marital Status: Not on file  Intimate Partner Violence:   . Fear of Current or Ex-Partner: Not on file  . Emotionally Abused: Not on file  . Physically Abused: Not on file  . Sexually Abused: Not on file    Review of systems: Review of Systems  Constitutional: Negative for fever and chills.  HENT: Negative.   Eyes: Negative for blurred vision.  Respiratory: as per HPI  Cardiovascular: Negative for chest pain and palpitations.  Gastrointestinal: Negative for vomiting, diarrhea, blood per rectum. Genitourinary: Negative for dysuria, urgency, frequency and hematuria.  Musculoskeletal: Negative for myalgias, back pain and joint pain.  Skin: Negative for itching and rash.  Neurological: Negative for dizziness, tremors, focal weakness, seizures and loss of consciousness.  Endo/Heme/Allergies: Negative for environmental allergies.   Psychiatric/Behavioral: Negative for depression, suicidal ideas and hallucinations.  All other systems reviewed and are negative.  Physical Exam: Blood pressure 138/76, pulse 65, temperature (!) 97.2 F (36.2 C), temperature source Skin, height _0  (1.575 m), weight 168 lb 12.8 oz (76.6 kg), SpO2 96 %. Gen:      No acute distress HEENT:  EOMI, sclera anicteric Neck:     No masses; no thyromegaly Lungs:    Clear to auscultation bilaterally; normal respiratory effort CV:         Regular rate and rhythm; no murmurs Abd:      + bowel sounds; soft, non-tender; no palpable masses, no distension Ext:    No edema; adequate peripheral perfusion Skin:      Warm and dry; no rash Neuro: alert and oriented x 3 Psych: normal  mood and affect  Data Reviewed: Imaging: CTA 11/13/19-extensive bilateral groundglass airspace disease.  No pulmonary embolism CT chest 06/03/2020-Bilateral bandlike scarring with tubular bronchiectasis in the lower lobes.  Coronary artery disease, aortic atherosclerosis I have reviewed the images personally.  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 Advised exercise therapy and weight loss with diet and exercise Discussed referral to pulmonary rehab but he prefers to do this at home and join a gym.  Follow-up in 6 months.  Can consider repeat CT but he is currently uninsured so would like to avoid additional expense unless he is not showing continued improvement.  Plan/Recommendations: Follow-up in 6 months Exercise  therapy  Marshell Garfinkel MD [facility] 08/17/2020, 10:21 AM  CC: Elsie Stain, MD

## 2020-08-17 NOTE — Patient Instructions (Signed)
Your CT scan shows mild residual scarring from COVID-19 pneumonia I have reviewed your lung function test which shows normal lung function.  This is good news I did advise starting an exercise regimen at the gym and weight loss with diet  Follow-up in 6 months.

## 2020-08-17 NOTE — Progress Notes (Signed)
PFT done today. 

## 2020-09-22 ENCOUNTER — Ambulatory Visit: Payer: Self-pay | Admitting: Critical Care Medicine

## 2020-10-07 ENCOUNTER — Encounter: Payer: Self-pay | Admitting: Critical Care Medicine

## 2020-10-07 ENCOUNTER — Other Ambulatory Visit: Payer: Self-pay

## 2020-10-07 ENCOUNTER — Ambulatory Visit: Payer: Self-pay | Attending: Critical Care Medicine | Admitting: Critical Care Medicine

## 2020-10-07 ENCOUNTER — Other Ambulatory Visit: Payer: Self-pay | Admitting: Critical Care Medicine

## 2020-10-07 VITALS — BP 138/80

## 2020-10-07 DIAGNOSIS — J8410 Pulmonary fibrosis, unspecified: Secondary | ICD-10-CM

## 2020-10-07 DIAGNOSIS — E119 Type 2 diabetes mellitus without complications: Secondary | ICD-10-CM

## 2020-10-07 DIAGNOSIS — Z8616 Personal history of COVID-19: Secondary | ICD-10-CM

## 2020-10-07 DIAGNOSIS — I10 Essential (primary) hypertension: Secondary | ICD-10-CM

## 2020-10-07 MED ORDER — ALBUTEROL SULFATE HFA 108 (90 BASE) MCG/ACT IN AERS: 1.0000 | INHALATION_SPRAY | RESPIRATORY_TRACT | 1 refills | Status: DC | PRN

## 2020-10-07 MED ORDER — VALSARTAN 160 MG PO TABS: 160.0000 mg | ORAL_TABLET | Freq: Every day | ORAL | 1 refills | Status: DC

## 2020-10-07 MED FILL — METFORMIN HCL 500 MG TABS: 500 | 30 days supply | Qty: 30 | Fill #1

## 2020-10-07 MED FILL — OMEPRAZOLE DR 40 MG CAPSULE: 40 | 30 days supply | Qty: 30 | Fill #1

## 2020-10-07 MED FILL — ATORVASTATIN 10 MG TABLET: 10 | 30 days supply | Qty: 30 | Fill #1

## 2020-10-07 MED FILL — AMLODIPINE BESYLATE 5 MG TA: 5 | 30 days supply | Qty: 30 | Fill #1

## 2020-10-07 MED FILL — $VENTOLIN HFA 18G INHALER: 108 (90 BAS | 50 days supply | Qty: 36 | Fill #0

## 2020-10-07 MED FILL — VALSARTAN 160 MG TABLET: 160 | 30 days supply | Qty: 30 | Fill #0

## 2020-10-07 NOTE — Assessment & Plan Note (Signed)
Hypertension not well controlled we will add valsartan HCT 160/12.5 daily continue amlodipine 5 mg daily

## 2020-10-07 NOTE — Assessment & Plan Note (Signed)
Pulmonary fibrosis improving no additional treatment indicated other than increasing exercise tolerance patient is now off oxygen  I asked the patient is discontinue Advair

## 2020-10-07 NOTE — Assessment & Plan Note (Signed)
Patient just got his booster vaccine for Covid yesterday I congratulated him on achieving this

## 2020-10-07 NOTE — Assessment & Plan Note (Signed)
Check hemoglobin A1c continue Metformin  Patient knows to get a eye exam

## 2020-10-07 NOTE — Progress Notes (Signed)
Subjective:    Patient ID: Walter Phillips, male    DOB: Jan 11, 1961, 59 y.o.   MRN: [id number] Virtual Visit via Telephone Note  I connected with Walter Phillips on 10/07/20 at  8:30 AM EST by telephone and verified that I am speaking with the correct person using two identifiers.   Consent:  I discussed the limitations, risks, security and privacy concerns of performing an evaluation and management service by telephone and the availability of in person appointments. I also discussed with the patient that there may be a patient responsible charge related to this service. The patient expressed understanding and agreed to proceed.  Location of patient: Patient's at home Location of provider: I am in my office  Persons participating in the televisit with the patient.    This visit was assisted by Seth Bake of [place] interpreters for Spanish interpretation   History of Present Illness:  11/10/19 :   59 year old male Walter Phillips who was admitted for Covid pneumonia between the 12th and 24 December.  He had significant hypoxemia and ultimately had to be discharged on oxygen 2 L rest 4 L exertion.  This is a post hospital visit.  Below is the discharge summary.  Admit date:10/15/2019 Discharge date:10/23/2019  Discharge summary: 59 year old gentleman with history of type 2 diabetes on Metformin, hyperlipidemia presented to the emergency room with complaining of shortness of breath. In the emergency room he was tachypneic, 88% on room air, initially placed on nonrebreather. He was admitted to the hospital with COVID-19 pneumonia.  Acute hypoxemic respiratory failure secondary to COVID-19 pneumonia: He completed 5 days of remdesivir therapy, he was started on steroid as outpatient, finished about 10 days of steroid therapy. Patient also received a dose of convalescent plasma and dose of Actemra on admission. His clinical status has improved, however he still requires supplemental oxygen. Has  improved from acute phase of infection, however remains persistently hypoxic. Patient will need to go home on oxygen and follow-up outpatient for subsequent weaning off the oxygen.  His blood sugars are elevated, hemoglobin A1c 7.9. He is uncomfortable using insulin at home. Since now he is going to stop further steroids, he will just resume Metformin and control his diet.  Discharge Diagnoses: Principal Problem: Sepsis ([place]) Active Problems: Pneumonia due to COVID-19 virus Acute respiratory failure with hypoxia ([place]) Dyslipidemia Uncontrolled type 2 diabetes mellitus with hyperglycemia (Walter Phillips)   11/10/2019 This is a return follow-up visit for this 59 year old male who was admitted in December for Covid pneumonia.  He had diffuse bilateral infiltrates and low lung volumes and required oxygen therapy on discharge.  I did a telephone visit with him last week and our plan was to get him into the office today for further evaluation.  Nevertheless he went back to the emergency room on 8 January and they discharged him without any changes in plan of care except for the prescription of an albuterol inhaler.  His oxygen saturation on room air was normal at that visit.  Chest x-ray is repeated and still shows bilateral chronic infiltrates.  The patient states he has chest discomfort, chest tightness, wheezing, cough productive of clear mucus, no fever, weakness and fatigue, muscle aches, headaches, sinus congestion noted as well.  Patient also notes nausea and abdominal tenderness and no emesis.  He has no appetite.  He does have excess gas at this time.  We did give him prescriptions for omeprazole simple saline simethicone at the last visit.  Patient also was given a prescription  for Metformin which he is taking 1 daily.  He does not have any glucose testing supplies at this time.  Note today's glucose is 123  The patient works as a Mudlogger  12/02/2019 Note this  visit was face-to-face and we used [place] Since the last visit the patient's breathing has improved.  He is off oxygen during the day.  He has no cough.  The patient still experiences some chest pain.  He is taking the omeprazole daily.  He notes less gas on the simethicone.  The patient did finish his course of prednisone.  He does maintain the Advair twice daily along with vitamin D and C supplementations he is now off zinc. Note CT angiogram showed persistent scarring and groundglass inflammation in the lung but no pulmonary emboli seen  Patient's blood sugars are under good control with 95-100 range fasting and up to 150s in the afternoons on the once daily Metformin 500 mg  Note liver functions were improving hepatitis C assay negative urine for microalbumin negative  01/13/2020 Walter Phillips is a 59 year old male with a history of type 2 diabetes mellitus, hyperlipidemia, COVID-19 infection (hospitalized 10/11/2019 through 10/23/2019), and pulmonary fibrosis who presents today for a follow up visit.  He no longer requires supplemental oxygen and his respiratory symptoms are improving.  He is back at work and has noted that as he starts to work faster he notices occasional shortness of breath.  During these episodes, he sits down for a few minutes and the symptoms resolve spontaneously.  He has chest tightness with deep inspiration.  He is using his rescue inhaler 1-2 times per week.  Denies cough, chest pain, SOB at rest.  Walking test revealed lowest oxygen saturation of 94-95% on room air with no dyspnea.    He occasionally checks his fasting blood sugar and it is usually in the 110's.  Endorses compliance with medication and a diabetic diet.  Today he mentions that he is occasionally experiencing hard stools and he hasn't taken anything to relieve symptoms.  The patient notes occasional chest tightness when he takes a deep breath.  There is no cough.  There is no lower  extremity edema.  The patient no longer is using oxygen yet the equipment is still in the home. The patient's run out of his Senokot-S and still is having some constipation.  He has yet to achieve an eye exam for his diabetes  He is due a Pneumovax 23 valent and tetanus vaccine he agrees to take these  He also intends to obtain the Covid vaccine when it becomes available for his group  05/19/2020 This visit was assisted with Spanish interpreter [name] [phone] Patient is seen in return follow-up notes cough and dyspnea is improved but he still has episodes of shortness of breath and chest tightness. Note on arrival his hemoglobin A1c is 6.3 he has had the Covid vaccine.  If he works and exerts himself he gets short of breath and has to take a break.  The patient is out of his Advair he does state the albuterol helps  06/22/2020 The patient returns today in follow-up of post Covid pulmonary fibrosis.  The patient is on Advair and states this does help with his dyspnea however at times when he uses the Advair he becomes somewhat agitated due to side effects  The patient has no real cough at this time his level of fatigue has markedly improved.  Patient does have type 2 diabetes  in a row on arrival his blood sugar glucose of 170.  He is maintaining the Metformin at a dose of 500 mg daily.  He has yet to have an eye exam formed.  He does maintain atorvastatin 10 mg daily.  The patient comes in with elevated blood pressure 142/93 and is not on antihypertensives at this time.  The patient does not have headaches chest pain or shortness of breath.  The patient also complains of erectile dysfunction and is wishing a prescription for Viagra-like medication  10/07/2020 Patient is seen by way of a telephone visit.  Patient has history of prior Covid pneumonia with pulmonary fibrosis.  He has gradually improved with this.  He was seen by pulmonary did not recommend additional treatments.  Patient only  uses the Advair as needed at this time.  He is off oxygen he is ambulating and back at work.  He has no longer coughing he does have some fatigue.  He notes he just got a booster vaccine and that may be why the fatigue is worse.  Note his blood pressures remained elevated at home.  He notes blood pressures anywhere in the range of 138/80 up to 145/90  The patient is only on the amlodipine 5 mg daily he does maintain Metformin and states his glucoses have been well controlled he is due an A1c evaluation and does need a eye exam to be performed  Past Medical History:  Diagnosis Date  . Chronic respiratory failure with hypoxia ([place]) 10/15/2019  . Diabetes mellitus, type II ([place])   . Dyslipidemia      Family History  Problem Relation Age of Onset  . Early death Mother        childbirth  . Cancer Father   . Stomach cancer Sister 33  . Stroke Sister   . Diabetes Daughter      Social History   Socioeconomic History  . Marital status: Married    Spouse name: Not on file  . Number of children: Not on file  . Years of education: Not on file  . Highest education level: Not on file  Occupational History  . Not on file  Tobacco Use  . Smoking status: Former Smoker    Packs/day: 0.50  . Smokeless tobacco: Never Used  Vaping Use  . Vaping Use: Never used  Substance and Sexual Activity  . Alcohol use: Yes    Comment: occ  . Drug use: No  . Sexual activity: Not on file  Other Topics Concern  . Not on file  Social History Narrative  . Not on file   Social Determinants of Health   Financial Resource Strain: Not on file  Food Insecurity: Not on file  Transportation Needs: Not on file  Physical Activity: Not on file  Stress: Not on file  Social Connections: Not on file  Intimate Partner Violence: Not on file     No Known Allergies   Outpatient Medications Prior to Visit  Medication Sig Dispense Refill  . amLODipine (NORVASC) 5 MG tablet Take 1 tablet (5 mg total) by mouth  daily. 90 tablet 3  . atorvastatin (LIPITOR) 10 MG tablet Take 1 tablet (10 mg total) by mouth daily. 30 tablet 3  . Blood Glucose Monitoring Suppl (TRUE METRIX METER) w/Device KIT Use to measure blood sugar twice a day 1 kit 0  . glucose blood (TRUE METRIX BLOOD GLUCOSE TEST) test strip Use as instructed 100 each 12  . metFORMIN (GLUCOPHAGE) 500 MG tablet  Take 1 tablet (500 mg total) by mouth daily with breakfast. 60 tablet 3  . olopatadine (PATANOL) 0.1 % ophthalmic solution Place 1 drop into the left eye 2 (two) times daily. 5 mL 0  . omeprazole (PRILOSEC) 40 MG capsule Take 1 capsule (40 mg total) by mouth daily. 30 capsule 3  . senna-docusate (SENOKOT-S) 8.6-50 MG tablet Take 1 tablet by mouth daily. 30 tablet 1  . sodium chloride (OCEAN) 0.65 % SOLN nasal spray Place 1 spray into both nostrils as needed for congestion. 30 mL 0  . tadalafil (CIALIS) 10 MG tablet Take 1 tablet (10 mg total) by mouth daily as needed for erectile dysfunction. 10 tablet 0  . TRUEplus Lancets 28G MISC Use to measure blood sugar twice a day 100 each 1  . fluticasone-salmeterol (ADVAIR HFA) 230-21 MCG/ACT inhaler Inhale 2 puffs into the lungs 2 (two) times daily. 1 Inhaler 12  . albuterol (VENTOLIN HFA) 108 (90 Base) MCG/ACT inhaler Inhale 1-2 puffs into the lungs every 4 (four) hours as needed for wheezing or shortness of breath. (Patient not taking: Reported on 10/07/2020) 8 g 1   No facility-administered medications prior to visit.     Review of Systems  Constitutional: Positive for fatigue. Negative for activity change.  HENT: Negative.   Respiratory: Negative for apnea, cough, choking, chest tightness, shortness of breath, wheezing and stridor.   Cardiovascular: Negative for chest pain, palpitations and leg swelling.  Gastrointestinal: Negative.   Genitourinary: Negative.   Musculoskeletal: Negative.   Neurological: Positive for headaches.  Hematological: Negative.   Psychiatric/Behavioral: Negative.   Negative for agitation, decreased concentration, dysphoric mood, self-injury, sleep disturbance and suicidal ideas. The patient is not nervous/anxious and is not hyperactive.        Objective:   Physical Exam Vitals:   10/07/20 0859  BP: 138/80    Telephone visit , no exam     Assessment & Plan:  I personally reviewed all images and lab data in the [facility] system as well as any outside material available during this office visit and agree with the  radiology impressions.   HTN (hypertension) Hypertension not well controlled we will add valsartan HCT 160/12.5 daily continue amlodipine 5 mg daily  Postinflammatory pulmonary fibrosis (HCC) Pulmonary fibrosis improving no additional treatment indicated other than increasing exercise tolerance patient is now off oxygen  I asked the patient is discontinue Advair  Controlled type 2 diabetes mellitus (HCC) Check hemoglobin A1c continue Metformin  Patient knows to get a eye exam  History of COVID-19 Patient just got his booster vaccine for Covid yesterday I congratulated him on achieving this   Diagnoses and all orders for this visit:  Controlled type 2 diabetes mellitus without complication, without long-term current use of insulin ([place]) -     Hemoglobin A1c; Future -     Hemoglobin A1c  Primary hypertension  Postinflammatory pulmonary fibrosis ([place])  History of COVID-19  Other orders -     valsartan (DIOVAN) 160 MG tablet; Take 1 tablet (160 mg total) by mouth daily. -     albuterol (VENTOLIN HFA) 108 (90 Base) MCG/ACT inhaler; Inhale 1-2 puffs into the lungs every 4 (four) hours as needed for wheezing or shortness of breath.     Follow Up Instructions: Patient knows to come in for hemoglobin A1c pick up his new blood pressure medication and will have a return visit in January face-to-face   I discussed the assessment and treatment plan with the patient. The patient was  provided an opportunity to ask questions and all were  answered. The patient agreed with the plan and demonstrated an understanding of the instructions.   The patient was advised to call back or seek an in-person evaluation if the symptoms worsen or if the condition fails to improve as anticipated.  I provided 30 minutes of non-face-to-face time during this encounter  including  median intraservice time , review of notes, labs, imaging, medications  and explaining diagnosis and management to the patient .    Asencion Noble, MD

## 2020-10-08 LAB — HEMOGLOBIN A1C
Est. average glucose Bld gHb Est-mCnc: 143 mg/dL
Hgb A1c MFr Bld: 6.6 % — ABNORMAL HIGH (ref 4.8–5.6)

## 2020-10-20 MED FILL — ATORVASTATIN 10 MG TABLET: 10 | 30 days supply | Qty: 30 | Fill #1

## 2020-10-20 MED FILL — AMLODIPINE BESYLATE 5 MG TA: 5 | 30 days supply | Qty: 30 | Fill #1

## 2020-10-20 MED FILL — METFORMIN HCL 500 MG TABS: 500 | 30 days supply | Qty: 30 | Fill #1

## 2020-10-20 MED FILL — OMEPRAZOLE DR 40 MG CAPSULE: 40 | 30 days supply | Qty: 30 | Fill #1

## 2020-11-11 ENCOUNTER — Encounter: Payer: Self-pay | Admitting: Critical Care Medicine

## 2020-11-11 ENCOUNTER — Ambulatory Visit: Payer: Self-pay | Attending: Critical Care Medicine | Admitting: Critical Care Medicine

## 2020-11-11 ENCOUNTER — Other Ambulatory Visit: Payer: Self-pay

## 2020-11-11 ENCOUNTER — Other Ambulatory Visit: Payer: Self-pay | Admitting: Critical Care Medicine

## 2020-11-11 VITALS — BP 131/99 | HR 86 | Temp 97.8°F | Resp 16 | Wt 168.0 lb

## 2020-11-11 DIAGNOSIS — J8410 Pulmonary fibrosis, unspecified: Secondary | ICD-10-CM

## 2020-11-11 DIAGNOSIS — I10 Essential (primary) hypertension: Secondary | ICD-10-CM

## 2020-11-11 DIAGNOSIS — E119 Type 2 diabetes mellitus without complications: Secondary | ICD-10-CM

## 2020-11-11 DIAGNOSIS — L409 Psoriasis, unspecified: Secondary | ICD-10-CM | POA: Insufficient documentation

## 2020-11-11 MED ORDER — METFORMIN HCL 500 MG PO TABS: 500.0000 mg | ORAL_TABLET | Freq: Every day | ORAL | 1 refills | Status: DC

## 2020-11-11 MED ORDER — CLOBETASOL PROPIONATE 0.05 % EX CREA: 1.0000 "application " | TOPICAL_CREAM | Freq: Two times a day (BID) | CUTANEOUS | 0 refills | Status: DC

## 2020-11-11 MED ORDER — ATORVASTATIN CALCIUM 10 MG PO TABS: 10.0000 mg | ORAL_TABLET | Freq: Every day | ORAL | 3 refills | Status: DC

## 2020-11-11 MED ORDER — OMEPRAZOLE 40 MG PO CPDR: 40.0000 mg | DELAYED_RELEASE_CAPSULE | Freq: Every day | ORAL | 3 refills | Status: DC

## 2020-11-11 MED ORDER — AMLODIPINE BESYLATE 10 MG PO TABS: 10.0000 mg | ORAL_TABLET | Freq: Every day | ORAL | 1 refills | Status: DC

## 2020-11-11 MED FILL — CLOBETASOL PROPIONATE 0.05: 0.05 | 15 days supply | Qty: 30 | Fill #0

## 2020-11-11 MED FILL — METFORMIN HCL 500 MG TABS: 500 | 30 days supply | Qty: 30 | Fill #0

## 2020-11-11 NOTE — Patient Instructions (Addendum)
Begin clobetasol cream to the affected areas on her skin twice daily Referral to dermatology will be made  Please obtain the financial discount application for an orange and blue card in [facility] health discount so we can get you coverage for the dermatology referrals  Please obtain an eye exam this year to check your retina for diabetes changes  Refills on all your medications sent to our pharmacy today  Lets monitor your bowel movements for now let us know if you have additional bleeding from the rectal area  Return to see Dr. Joya Gaskins in 3 months   ______________________________________________________________________________________   Comience con la crema de clobetasol en las reas afectadas de su [place]. Se har derivacin a dermatologa.  Obtenga la solicitud de descuento financiero para una tarjeta naranja y [place] en el descuento de salud de [place] para que podamos obtener cobertura para las referencias de Psychologist, forensic.  Hgase un examen de la vista este ao para revisar su retina en busca de cambios en la diabetes.  Resurtidos de todos sus medicamentos enviados a nuestra farmacia hoy  Controlemos sus evacuaciones intestinales por ahora hganos saber si tiene sangrado adicional en el rea rectal  Volver a ver al Dr. Joya Gaskins en 3 meses

## 2020-11-11 NOTE — Assessment & Plan Note (Signed)
Weight loss advised continue inhalers

## 2020-11-11 NOTE — Assessment & Plan Note (Signed)
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

## 2020-11-11 NOTE — Progress Notes (Signed)
Subjective:    Patient ID: Walter Phillips, male    DOB: August 20, 1961, 60 y.o.   MRN: [id number] This visit was assisted by Caren Griffins  of [employer] interpreters for Spanish interpretation   History of Present Illness:  11/10/19 :   60 year old male Walter Phillips who was admitted for Covid pneumonia between the 12th and 24 December.  He had significant hypoxemia and ultimately had to be discharged on oxygen 2 L rest 4 L exertion.  This is a post hospital visit.  Below is the discharge summary.  Admit date:10/15/2019 Discharge date:10/23/2019  Discharge summary: 60 year old gentleman with history of type 2 diabetes on Metformin, hyperlipidemia presented to the emergency room with complaining of shortness of breath. In the emergency room he was tachypneic, 88% on room air, initially placed on nonrebreather. He was admitted to the hospital with COVID-19 pneumonia.  Acute hypoxemic respiratory failure secondary to COVID-19 pneumonia: He completed 5 days of remdesivir therapy, he was started on steroid as outpatient, finished about 10 days of steroid therapy. Patient also received a dose of convalescent plasma and dose of Actemra on admission. His clinical status has improved, however he still requires supplemental oxygen. Has improved from acute phase of infection, however remains persistently hypoxic. Patient will need to go home on oxygen and follow-up outpatient for subsequent weaning off the oxygen.  His blood sugars are elevated, hemoglobin A1c 7.9. He is uncomfortable using insulin at home. Since now he is going to stop further steroids, he will just resume Metformin and control his diet.  Discharge Diagnoses: Principal Problem: Sepsis ([place]) Active Problems: Pneumonia due to COVID-19 virus Acute respiratory failure with hypoxia ([place]) Dyslipidemia Uncontrolled type 2 diabetes mellitus with hyperglycemia ([place])   11/10/2019 This is a return follow-up visit for this  60 year old male who was admitted in December for Covid pneumonia.  He had diffuse bilateral infiltrates and low lung volumes and required oxygen therapy on discharge.  I did a telephone visit with him last week and our plan was to get him into the office today for further evaluation.  Nevertheless he went back to the emergency room on 8 January and they discharged him without any changes in plan of care except for the prescription of an albuterol inhaler.  His oxygen saturation on room air was normal at that visit.  Chest x-ray is repeated and still shows bilateral chronic infiltrates.  The patient states he has chest discomfort, chest tightness, wheezing, cough productive of clear mucus, no fever, weakness and fatigue, muscle aches, headaches, sinus congestion noted as well.  Patient also notes nausea and abdominal tenderness and no emesis.  He has no appetite.  He does have excess gas at this time.  We did give him prescriptions for omeprazole simple saline simethicone at the last visit.  Patient also was given a prescription for Metformin which he is taking 1 daily.  He does not have any glucose testing supplies at this time.  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  He is off oxygen during the day.  He has no cough.  The patient still experiences some chest pain.  He is taking the omeprazole daily.  He notes less gas on the simethicone.  The patient did finish his course of prednisone.  He does maintain the Advair twice daily along with vitamin  D and C supplementations he is now off zinc. Note CT angiogram showed persistent scarring and groundglass inflammation in the lung but no pulmonary emboli seen  Patient's blood sugars are under good control with 95-100 range fasting and up to 150s in the afternoons on the once  daily Metformin 500 mg  Note liver functions were improving hepatitis C assay negative urine for microalbumin negative  01/13/2020 Walter Phillips is a 60 year old male with a history of type 2 diabetes mellitus, hyperlipidemia, COVID-19 infection (hospitalized 10/11/2019 through 10/23/2019), and pulmonary fibrosis who presents today for a follow up visit.  He no longer requires supplemental oxygen and his respiratory symptoms are improving.  He is back at work and has noted that as he starts to work faster he notices occasional shortness of breath.  During these episodes, he sits down for a few minutes and the symptoms resolve spontaneously.  He has chest tightness with deep inspiration.  He is using his rescue inhaler 1-2 times per week.  Denies cough, chest pain, SOB at rest.  Walking test revealed lowest oxygen saturation of 94-95% on room air with no dyspnea.    He occasionally checks his fasting blood sugar and it is usually in the 110's.  Endorses compliance with medication and a diabetic diet.  Today he mentions that he is occasionally experiencing hard stools and he hasn't taken anything to relieve symptoms.  The patient notes occasional chest tightness when he takes a deep breath.  There is no cough.  There is no lower extremity edema.  The patient no longer is using oxygen yet the equipment is still in the home. The patient's run out of his Senokot-S and still is having some constipation.  He has yet to achieve an eye exam for his diabetes  He is due a Pneumovax 23 valent and tetanus vaccine he agrees to take these  He also intends to obtain the Covid vaccine when it becomes available for his group  05/19/2020 This visit was assisted with Spanish interpreter [name] [phone] Patient is seen in return follow-up notes cough and dyspnea is improved but he still has episodes of shortness of breath and chest tightness. Note on arrival his hemoglobin A1c is 6.3 he has had the Covid vaccine.   If he works and exerts himself he gets short of breath and has to take a break.  The patient is out of his Advair he does state the albuterol helps  06/22/2020 The patient returns today in follow-up of post Covid pulmonary fibrosis.  The patient is on Advair and states this does help with his dyspnea however at times when he uses the Advair he becomes somewhat agitated due to side effects  The patient has no real cough at this time his level of fatigue has markedly improved.  Patient does have type 2 diabetes in a row on arrival his blood sugar glucose of 170.  He is maintaining the Metformin at a dose of 500 mg daily.  He has yet to have an eye exam formed.  He does maintain atorvastatin 10 mg daily.  The patient comes in with elevated blood pressure 142/93 and is not on antihypertensives at this time.  The patient does not have headaches chest pain or shortness of breath.  The patient also complains of erectile dysfunction and is wishing a prescription for Viagra-like medication  10/07/2020 Patient is seen by way of a telephone visit.  Patient has history of prior Covid pneumonia with pulmonary fibrosis.  He has  gradually improved with this.  He was seen by pulmonary did not recommend additional treatments.  Patient only uses the Advair as needed at this time.  He is off oxygen he is ambulating and back at work.  He has no longer coughing he does have some fatigue.  He notes he just got a booster vaccine and that may be why the fatigue is worse.  Note his blood pressures remained elevated at home.  He notes blood pressures anywhere in the range of 138/80 up to 145/90  The patient is only on the amlodipine 5 mg daily he does maintain Metformin and states his glucoses have been well controlled he is due an A1c evaluation and does need a eye exam to be performed  11/11/2020 PCP f/u visit chronic health mgmt of HTN, Pulm fibrosis post Covid, T2DM, HLD note since last visit the patient has been doing  better with his shortness of breath he has less cough he has mild chest tightness mild shortness of breath with exertion only.  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  He has been on a topical steroid cream without much improvement.  He does not have any health insurance  Patient with diabetes A1c in the six range still compliant with therapy.  Patient does note 1 time with a bowel movement hospital blood none since  Past Medical History:  Diagnosis Date  . Chronic respiratory failure with hypoxia ([place]) 10/15/2019  . Diabetes mellitus, type II ([place])   . Dyslipidemia      Family History  Problem Relation Age of Onset  . Early death Mother        childbirth  . Cancer Father   . Stomach cancer Sister 85  . Stroke Sister   . Diabetes Daughter      Social History   Socioeconomic History  . Marital status: Married    Spouse name: Not on file  . Number of children: Not on file  . Years of education: Not on file  . Highest education level: Not on file  Occupational History  . Not on file  Tobacco Use  . Smoking status: Former Smoker    Packs/day: 0.50  . Smokeless tobacco: Never Used  Vaping Use  . Vaping Use: Never used  Substance and Sexual Activity  . Alcohol use: Yes    Comment: occ  . Drug use: No  . Sexual activity: Not on file  Other Topics Concern  . Not on file  Social History Narrative  . Not on file   Social Determinants of Health   Financial Resource Strain: Not on file  Food Insecurity: Not on file  Transportation Needs: Not on file  Physical Activity: Not on file  Stress: Not on file  Social Connections: Not on file  Intimate Partner Violence: Not on file     No Known Allergies   Outpatient Medications Prior to Visit  Medication Sig Dispense Refill  . albuterol (VENTOLIN HFA) 108 (90 Base) MCG/ACT inhaler Inhale 1-2 puffs into the lungs every 4 (four) hours as needed for wheezing or shortness of  breath. 8 g 1  . Blood Glucose Monitoring Suppl (TRUE METRIX METER) w/Device KIT Use to measure blood sugar twice a day 1 kit 0  . glucose blood (TRUE METRIX BLOOD GLUCOSE TEST) test strip Use as instructed 100 each 12  . TRUEplus Lancets 28G MISC Use to measure blood sugar twice a day 100 each 1  .  valsartan (DIOVAN) 160 MG tablet Take 1 tablet (160 mg total) by mouth daily. 90 tablet 1  . amLODipine (NORVASC) 5 MG tablet Take 1 tablet (5 mg total) by mouth daily. 90 tablet 3  . metFORMIN (GLUCOPHAGE) 500 MG tablet Take 1 tablet (500 mg total) by mouth daily with breakfast. 60 tablet 3  . omeprazole (PRILOSEC) 40 MG capsule Take 1 capsule (40 mg total) by mouth daily. 30 capsule 3  . olopatadine (PATANOL) 0.1 % ophthalmic solution Place 1 drop into the left eye 2 (two) times daily. 5 mL 0  . atorvastatin (LIPITOR) 10 MG tablet Take 1 tablet (10 mg total) by mouth daily. (Patient not taking: Reported on 11/11/2020) 30 tablet 3  . senna-docusate (SENOKOT-S) 8.6-50 MG tablet Take 1 tablet by mouth daily. (Patient not taking: Reported on 11/11/2020) 30 tablet 1  . sodium chloride (OCEAN) 0.65 % SOLN nasal spray Place 1 spray into both nostrils as needed for congestion. (Patient not taking: Reported on 11/11/2020) 30 mL 0  . tadalafil (CIALIS) 10 MG tablet Take 1 tablet (10 mg total) by mouth daily as needed for erectile dysfunction. (Patient not taking: Reported on 11/11/2020) 10 tablet 0   No facility-administered medications prior to visit.     Review of Systems  Constitutional: Negative for activity change and fatigue.  HENT: Negative.   Respiratory: Positive for chest tightness and shortness of breath. Negative for apnea, cough, choking, wheezing and stridor.        Is better  Cardiovascular: Negative for chest pain, palpitations and leg swelling.  Gastrointestinal: Positive for blood in stool. Negative for abdominal distention, abdominal pain, anal bleeding, constipation, diarrhea, nausea,  rectal pain and vomiting.       One time small blood clot, in stool No burning in rectum  Genitourinary: Negative.   Musculoskeletal: Negative.   Skin: Positive for rash.  Neurological: Negative for headaches.  Hematological: Negative.   Psychiatric/Behavioral: Negative.  Negative for agitation, decreased concentration, dysphoric mood, self-injury, sleep disturbance and suicidal ideas. The patient is not nervous/anxious and is not hyperactive.        Objective:   Physical Exam  Vitals:   11/11/20 0947  BP: (!) 131/99  Pulse: 86  Resp: 16  Temp: 97.8 F (36.6 C)  TempSrc: Oral  SpO2: 96%  Weight: 168 lb (76.2 kg)    Gen: Pleasant, well-nourished, in no distress,  normal affect  ENT: No lesions,  mouth clear,  oropharynx clear, no postnasal drip  Neck: No JVD, no TMG, no carotid bruits  Lungs: No use of accessory muscles, no dullness to percussion, clear without rales or rhonchi  Cardiovascular: RRR, heart sounds normal, no murmur or gallops, no peripheral edema  Abdomen: soft and NT, no HSM,  BS normal  Musculoskeletal: No deformities, no cyanosis or clubbing  Neuro: alert, non focal  Skin: Warm, diffuse psoriatic discoid lesions  No results found.      Assessment & Plan:  I personally reviewed all images and lab data in the [facility] system as well as any outside material available during this office visit and agree with the  radiology impressions.   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 -     amLODipine (NORVASC) 10 MG tablet; Take 1 tablet (10 mg total) by mouth daily. -     omeprazole (PRILOSEC) 40 MG capsule; Take 1 capsule (40 mg total) by mouth daily. -     atorvastatin (LIPITOR) 10 MG tablet; Take 1 tablet (10 mg total) by mouth daily. -     clobetasol cream (TEMOVATE) 0.05 %; Apply 1 application topically 2 (two) times daily.

## 2020-11-11 NOTE — Assessment & Plan Note (Signed)
Continue current medications follow-up labs

## 2020-11-11 NOTE — Assessment & Plan Note (Addendum)
A increase amlodipine to 10 mg daily

## 2020-11-11 NOTE — Progress Notes (Signed)
Has these spots on his skin located on left arm and back Has medication in phone that was used to treat the spots: Betamethasone   Has slight light headness but no falls at home. Some SOB and  intermittent tightness in the chest

## 2020-11-15 MED FILL — ATORVASTATIN 10 MG TABLET: 10 | 30 days supply | Qty: 30 | Fill #0

## 2020-11-15 MED FILL — AMLODIPINE BESYLATE 10 MG T: 10 | 30 days supply | Qty: 30 | Fill #0

## 2020-11-15 MED FILL — OMEPRAZOLE DR 40 MG CAPSULE: 40 | 30 days supply | Qty: 30 | Fill #0

## 2021-02-09 ENCOUNTER — Other Ambulatory Visit: Payer: Self-pay

## 2021-02-09 ENCOUNTER — Ambulatory Visit: Payer: Self-pay | Attending: Critical Care Medicine | Admitting: Critical Care Medicine

## 2021-02-09 ENCOUNTER — Encounter: Payer: Self-pay | Admitting: Critical Care Medicine

## 2021-02-09 VITALS — BP 118/73 | HR 61 | Ht 62.0 in | Wt 168.2 lb

## 2021-02-09 DIAGNOSIS — E118 Type 2 diabetes mellitus with unspecified complications: Secondary | ICD-10-CM | POA: Insufficient documentation

## 2021-02-09 DIAGNOSIS — E785 Hyperlipidemia, unspecified: Secondary | ICD-10-CM

## 2021-02-09 DIAGNOSIS — L409 Psoriasis, unspecified: Secondary | ICD-10-CM

## 2021-02-09 DIAGNOSIS — E119 Type 2 diabetes mellitus without complications: Secondary | ICD-10-CM | POA: Insufficient documentation

## 2021-02-09 DIAGNOSIS — I10 Essential (primary) hypertension: Secondary | ICD-10-CM

## 2021-02-09 DIAGNOSIS — Z1211 Encounter for screening for malignant neoplasm of colon: Secondary | ICD-10-CM

## 2021-02-09 DIAGNOSIS — E1165 Type 2 diabetes mellitus with hyperglycemia: Secondary | ICD-10-CM

## 2021-02-09 DIAGNOSIS — Z5181 Encounter for therapeutic drug level monitoring: Secondary | ICD-10-CM

## 2021-02-09 DIAGNOSIS — Z79899 Other long term (current) drug therapy: Secondary | ICD-10-CM

## 2021-02-09 DIAGNOSIS — J8410 Pulmonary fibrosis, unspecified: Secondary | ICD-10-CM

## 2021-02-09 LAB — POCT GLYCOSYLATED HEMOGLOBIN (HGB A1C): Hemoglobin A1C: 6.9 % — AB (ref 4.0–5.6)

## 2021-02-09 LAB — GLUCOSE, POCT (MANUAL RESULT ENTRY): POC Glucose: 207 mg/dl — AB (ref 70–99)

## 2021-02-09 MED ORDER — AMLODIPINE BESYLATE 10 MG PO TABS
ORAL_TABLET | Freq: Every day | ORAL | 1 refills | Status: DC
  Filled 2021-02-09: qty 30, 30d supply, fill #0

## 2021-02-09 MED ORDER — ATORVASTATIN CALCIUM 10 MG PO TABS
ORAL_TABLET | Freq: Every day | ORAL | 3 refills | Status: DC
  Filled 2021-02-09: qty 30, 30d supply, fill #0

## 2021-02-09 MED ORDER — METFORMIN HCL 1000 MG PO TABS
1000.0000 mg | ORAL_TABLET | Freq: Every day | ORAL | 3 refills | Status: DC
  Filled 2021-02-09: qty 30, 30d supply, fill #0

## 2021-02-09 MED ORDER — TRUE METRIX BLOOD GLUCOSE TEST VI STRP
ORAL_STRIP | 12 refills | Status: DC
  Filled 2021-02-09 – 2021-11-29 (×2): qty 100, 25d supply, fill #0

## 2021-02-09 MED ORDER — TRUEPLUS LANCETS 28G MISC
1 refills | Status: DC
  Filled 2021-02-09 – 2021-11-29 (×2): qty 100, 25d supply, fill #0

## 2021-02-09 MED ORDER — OMEPRAZOLE 40 MG PO CPDR
DELAYED_RELEASE_CAPSULE | Freq: Every day | ORAL | 3 refills | Status: DC
  Filled 2021-02-09: qty 30, 30d supply, fill #0

## 2021-02-09 MED ORDER — VALSARTAN 160 MG PO TABS
ORAL_TABLET | Freq: Every day | ORAL | 1 refills | Status: DC
  Filled 2021-02-09: qty 30, 30d supply, fill #0

## 2021-02-09 MED ORDER — CLOBETASOL PROPIONATE 0.05 % EX CREA
TOPICAL_CREAM | Freq: Two times a day (BID) | CUTANEOUS | 1 refills | Status: DC
  Filled 2021-02-09: qty 30, 15d supply, fill #0

## 2021-02-09 NOTE — Assessment & Plan Note (Signed)
Type 2 diabetes not yet at goal increase Metformin 1000 mg daily

## 2021-02-09 NOTE — Progress Notes (Signed)
   Subjective:    Patient ID: Walter Phillips, male    DOB: 01/04/1961, 59 y.o.   MRN: [id number] This visit was assisted by Cynthia  of [employer] interpreters for Spanish interpretation   History of Present Illness:  11/10/19 :   58-year-old male Latino who was admitted for Covid pneumonia between the 12th and 24 December.  He had significant hypoxemia and ultimately had to be discharged on oxygen 2 L rest 4 L exertion.  This is a post hospital visit.  Below is the discharge summary.  Admit date:10/15/2019 Discharge date:10/23/2019  Discharge summary: 58-year-old gentleman with history of type 2 diabetes on Metformin, hyperlipidemia presented to the emergency room with complaining of shortness of breath. In the emergency room he was tachypneic, 88% on room air, initially placed on nonrebreather. He was admitted to the hospital with COVID-19 pneumonia.  Acute hypoxemic respiratory failure secondary to COVID-19 pneumonia: He completed 5 days of remdesivir therapy, he was started on steroid as outpatient, finished about 10 days of steroid therapy. Patient also received a dose of convalescent plasma and dose of Actemra on admission. His clinical status has improved, however he still requires supplemental oxygen. Has improved from acute phase of infection, however remains persistently hypoxic. Patient will need to go home on oxygen and follow-up outpatient for subsequent weaning off the oxygen.  His blood sugars are elevated, hemoglobin A1c 7.9. He is uncomfortable using insulin at home. Since now he is going to stop further steroids, he will just resume Metformin and control his diet.  Discharge Diagnoses: Principal Problem: Sepsis (HCC) Active Problems: Pneumonia due to COVID-19 virus Acute respiratory failure with hypoxia (HCC) Dyslipidemia Uncontrolled type 2 diabetes mellitus with hyperglycemia (HCC)   11/10/2019 This is a return follow-up visit for this  58-year-old male who was admitted in December for Covid pneumonia.  He had diffuse bilateral infiltrates and low lung volumes and required oxygen therapy on discharge.  I did a telephone visit with him last week and our plan was to get him into the office today for further evaluation.  Nevertheless he went back to the emergency room on 8 January and they discharged him without any changes in plan of care except for the prescription of an albuterol inhaler.  His oxygen saturation on room air was normal at that visit.  Chest x-ray is repeated and still shows bilateral chronic infiltrates.  The patient states he has chest discomfort, chest tightness, wheezing, cough productive of clear mucus, no fever, weakness and fatigue, muscle aches, headaches, sinus congestion noted as well.  Patient also notes nausea and abdominal tenderness and no emesis.  He has no appetite.  He does have excess gas at this time.  We did give him prescriptions for omeprazole simple saline simethicone at the last visit.  Patient also was given a prescription for Metformin which he is taking 1 daily.  He does not have any glucose testing supplies at this time.  Note today's glucose is 123  The patient works as a furniture repairman and salesman  12/02/2019 Note this visit was face-to-face and we used [employer] interpreter Walter Phillips Since the last visit the patient's breathing has improved.  He is off oxygen during the day.  He has no cough.  The patient still experiences some chest pain.  He is taking the omeprazole daily.  He notes less gas on the simethicone.  The patient did finish his course of prednisone.  He does maintain the Advair twice daily along with vitamin   D and C supplementations he is now off zinc. Note CT angiogram showed persistent scarring and groundglass inflammation in the lung but no pulmonary emboli seen  Patient's blood sugars are under good control with 95-100 range fasting and up to 150s in the afternoons on the once  daily Metformin 500 mg  Note liver functions were improving hepatitis C assay negative urine for microalbumin negative  01/13/2020 Walter Phillips is a 60 year old male with a history of type 2 diabetes mellitus, hyperlipidemia, COVID-19 infection (hospitalized 10/11/2019 through 10/23/2019), and pulmonary fibrosis who presents today for a follow up visit.  He no longer requires supplemental oxygen and his respiratory symptoms are improving.  He is back at work and has noted that as he starts to work faster he notices occasional shortness of breath.  During these episodes, he sits down for a few minutes and the symptoms resolve spontaneously.  He has chest tightness with deep inspiration.  He is using his rescue inhaler 1-2 times per week.  Denies cough, chest pain, SOB at rest.  Walking test revealed lowest oxygen saturation of 94-95% on room air with no dyspnea.    He occasionally checks his fasting blood sugar and it is usually in the 110's.  Endorses compliance with medication and a diabetic diet.  Today he mentions that he is occasionally experiencing hard stools and he hasn't taken anything to relieve symptoms.  The patient notes occasional chest tightness when he takes a deep breath.  There is no cough.  There is no lower extremity edema.  The patient no longer is using oxygen yet the equipment is still in the home. The patient's run out of his Senokot-S and still is having some constipation.  He has yet to achieve an eye exam for his diabetes  He is due a Pneumovax 23 valent and tetanus vaccine he agrees to take these  He also intends to obtain the Covid vaccine when it becomes available for his group  05/19/2020 This visit was assisted with Spanish interpreter Walter Phillips [phone] Patient is seen in return follow-up notes cough and dyspnea is improved but he still has episodes of shortness of breath and chest tightness. Note on arrival his hemoglobin A1c is 6.3 he has had the Covid vaccine.   If he works and exerts himself he gets short of breath and has to take a break.  The patient is out of his Advair he does state the albuterol helps  06/22/2020 The patient returns today in follow-up of post Covid pulmonary fibrosis.  The patient is on Advair and states this does help with his dyspnea however at times when he uses the Advair he becomes somewhat agitated due to side effects  The patient has no real cough at this time his level of fatigue has markedly improved.  Patient does have type 2 diabetes in a row on arrival his blood sugar glucose of 170.  He is maintaining the Metformin at a dose of 500 mg daily.  He has yet to have an eye exam formed.  He does maintain atorvastatin 10 mg daily.  The patient comes in with elevated blood pressure 142/93 and is not on antihypertensives at this time.  The patient does not have headaches chest pain or shortness of breath.  The patient also complains of erectile dysfunction and is wishing a prescription for Viagra-like medication  10/07/2020 Patient is seen by way of a telephone visit.  Patient has history of prior Covid pneumonia with pulmonary fibrosis.  He has  gradually improved with this.  He was seen by pulmonary did not recommend additional treatments.  Patient only uses the Advair as needed at this time.  He is off oxygen he is ambulating and back at work.  He has no longer coughing he does have some fatigue.  He notes he just got a booster vaccine and that may be why the fatigue is worse.  Note his blood pressures remained elevated at home.  He notes blood pressures anywhere in the range of 138/80 up to 145/90  The patient is only on the amlodipine 5 mg daily he does maintain Metformin and states his glucoses have been well controlled he is due an A1c evaluation and does need a eye exam to be performed  11/11/2020 PCP f/u visit chronic health mgmt of HTN, Pulm fibrosis post Covid, T2DM, HLD note since last visit the patient has been doing  better with his shortness of breath he has less cough he has mild chest tightness mild shortness of breath with exertion only.  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  He has been on a topical steroid cream without much improvement.  He does not have any health insurance  Patient with diabetes A1c in the six range still compliant with therapy.  Patient does note 1 time with a bowel movement hospital blood none since   02/09/2021 Patient seen in return follow-up this visit was assisted by Walter Phillips interpreter #9 04/08/1956 Patient complains of mild chest tightness but overall breathing is improved he states his rash is 80% better would like refills on his topical anti-inflammatory.  On arrival A1c 6.9 blood sugar 207 he is only on 500 mg daily of Metformin.  He also has some increased gas otherwise unremarkable  Blood pressure is good on arrival 118/73  Past Medical History:  Diagnosis Date  . Chronic respiratory failure with hypoxia (HCC) 10/15/2019  . Diabetes mellitus, type II (HCC)   . Dyslipidemia      Family History  Problem Relation Age of Onset  . Early death Mother        childbirth  . Cancer Father   . Stomach cancer Sister 72  . Stroke Sister   . Diabetes Daughter      Social History   Socioeconomic History  . Marital status: Married    Spouse name: Not on file  . Number of children: Not on file  . Years of education: Not on file  . Highest education level: Not on file  Occupational History  . Not on file  Tobacco Use  . Smoking status: Former Smoker    Packs/day: 0.50  . Smokeless tobacco: Never Used  Vaping Use  . Vaping Use: Never used  Substance and Sexual Activity  . Alcohol use: Yes    Comment: occ  . Drug use: No  . Sexual activity: Not on file  Other Topics Concern  . Not on file  Social History Narrative  . Not on file   Social Determinants of Health   Financial Resource Strain: Not on file  Food  Insecurity: Not on file  Transportation Needs: Not on file  Physical Activity: Not on file  Stress: Not on file  Social Connections: Not on file  Intimate Partner Violence: Not on file     No Known Allergies   Outpatient Medications Prior to Visit  Medication Sig Dispense Refill  . albuterol (VENTOLIN HFA) 108 (90 Base) MCG/ACT inhaler Inhale 1-2   puffs into the lungs every 4 (four) hours as needed for wheezing or shortness of breath. 8 g 1  . Blood Glucose Monitoring Suppl (TRUE METRIX METER) w/Device KIT Use to measure blood sugar twice a day 1 kit 0  . olopatadine (PATANOL) 0.1 % ophthalmic solution Place 1 drop into the left eye 2 (two) times daily. 5 mL 0  . amLODipine (NORVASC) 10 MG tablet TAKE 1 TABLET (10 MG TOTAL) BY MOUTH DAILY. 90 tablet 1  . atorvastatin (LIPITOR) 10 MG tablet TAKE 1 TABLET (10 MG TOTAL) BY MOUTH DAILY. 90 tablet 3  . clobetasol cream (TEMOVATE) 2.24 % APPLY 1 APPLICATION TOPICALLY 2 (TWO) TIMES DAILY. 30 g 0  . glucose blood (TRUE METRIX BLOOD GLUCOSE TEST) test strip Use as instructed 100 each 12  . metFORMIN (GLUCOPHAGE) 500 MG tablet TAKE 1 TABLET (500 MG TOTAL) BY MOUTH DAILY WITH BREAKFAST. 180 tablet 1  . omeprazole (PRILOSEC) 40 MG capsule TAKE 1 CAPSULE (40 MG TOTAL) BY MOUTH DAILY. 30 capsule 3  . TRUEplus Lancets 28G MISC Use to measure blood sugar twice a day 100 each 1  . valsartan (DIOVAN) 160 MG tablet TAKE 1 TABLET (160 MG TOTAL) BY MOUTH DAILY. 90 tablet 1   No facility-administered medications prior to visit.     Review of Systems  Constitutional: Negative for activity change and fatigue.  HENT: Negative.   Respiratory: Positive for chest tightness and shortness of breath. Negative for apnea, cough, choking, wheezing and stridor.        Is better  Cardiovascular: Negative for chest pain, palpitations and leg swelling.  Gastrointestinal: Positive for blood in stool. Negative for abdominal distention, abdominal pain, anal bleeding,  constipation, diarrhea, nausea, rectal pain and vomiting.       One time small blood clot, in stool No burning in rectum  Genitourinary: Negative.   Musculoskeletal: Negative.   Skin: Positive for rash.  Neurological: Negative for headaches.  Hematological: Negative.   Psychiatric/Behavioral: Negative.  Negative for agitation, decreased concentration, dysphoric mood, self-injury, sleep disturbance and suicidal ideas. The patient is not nervous/anxious and is not hyperactive.        Objective:   Physical Exam  Vitals:   02/09/21 0912  BP: 118/73  Pulse: 61  SpO2: 96%  Weight: 168 lb 3.2 oz (76.3 kg)  Height: 5' 2" (1.575 m)    Gen: Pleasant, well-nourished, in no distress,  normal affect  ENT: No lesions,  mouth clear,  oropharynx clear, no postnasal drip  Neck: No JVD, no TMG, no carotid bruits  Lungs: No use of accessory muscles, no dullness to percussion, clear without rales or rhonchi  Cardiovascular: RRR, heart sounds normal, no murmur or gallops, no peripheral edema  Abdomen: soft and NT, no HSM,  BS normal  Musculoskeletal: No deformities, no cyanosis or clubbing  Neuro: alert, non focal  Skin: Warm, diffuse psoriatic discoid lesions are markedly improved Foot exam was normal No results found.      Assessment & Plan:  I personally reviewed all images and lab data in the [facility] system as well as any outside material available during this office visit and agree with the  radiology impressions.   No problem-specific Assessment & Plan notes found for this encounter.   Kasin was seen today for diabetes.  Diagnoses and all orders for this visit:  Controlled type 2 diabetes mellitus without complication, without long-term current use of insulin (HCC) -     POCT glucose (manual entry) -  POCT glycosylated hemoglobin (Hb A1C)  Uncontrolled type 2 diabetes mellitus with hyperglycemia (HCC)  Dyslipidemia -     Lipid panel  Colon cancer screening -      Fecal occult blood, imunochemical  Encounter for monitoring statin therapy -     Lipid panel -     Hepatic function panel  Other orders -     amLODipine (NORVASC) 10 MG tablet; TAKE 1 TABLET (10 MG TOTAL) BY MOUTH DAILY. -     atorvastatin (LIPITOR) 10 MG tablet; TAKE 1 TABLET (10 MG TOTAL) BY MOUTH DAILY. -     clobetasol cream (TEMOVATE) 0.05 %; Apply topically 2 (two) times daily. -     omeprazole (PRILOSEC) 40 MG capsule; TAKE 1 CAPSULE (40 MG TOTAL) BY MOUTH DAILY. -     valsartan (DIOVAN) 160 MG tablet; TAKE 1 TABLET (160 MG TOTAL) BY MOUTH DAILY. -     TRUEplus Lancets 28G MISC; Use to measure blood sugar twice a day -     glucose blood (TRUE METRIX BLOOD GLUCOSE TEST) test strip; Use as instructed -     metFORMIN (GLUCOPHAGE) 1000 MG tablet; Take 1 tablet (1,000 mg total) by mouth daily with breakfast.  Monitor lipid panel and check liver function while on statin  Refill omeprazole for reflux  Obtain fecal occult for colon cancer screening  Patient to receive eye exam

## 2021-02-09 NOTE — Progress Notes (Signed)
Tightness in chest Lancet refill

## 2021-02-09 NOTE — Assessment & Plan Note (Signed)
Blood pressure at goal continue amlodipine and valsartan

## 2021-02-09 NOTE — Patient Instructions (Addendum)
Increase Metformin to 1000 mg daily with food No other medication changes and your steroid medicine cream was refilled for your skin  Please obtain an eye exam go to the [facility] site we gave you on the resource  Sheet  A stool kit will be given to check you for colon cancer please process this bring it back to the lab  Return to see Dr. Joya Gaskins again in 4 months  Please obtain gas ask simethicone take 1-2 of these twice daily for excess gas this is over-the-counter  Aumente la metformina a 1000 mg al da con alimentos Ningn otro medicamento cambi y su crema con esteroides se volvi a llenar para su piel.  Hgase un examen de la vista, vaya al sitio de Customer service manager proporcionamos en la [place]  Se le entregar un kit de heces para verificar si tiene cncer de colon, procese esto y llvelo al laboratorio.  Volver a ver al Dr. Lenon Curt en 4 meses  Consiga gas, pida simeticona, tome 1-2 de [employer] al da para el exceso de gas, esto es de [place].  Distensin abdominal Abdominal Bloating Cuando padece de distensin abdominal, su abdomen se siente lleno, tenso o con dolor. Tambin puede lucir ms grande o hinchado que lo normal (distendido). Las causas comunes de la distensin abdominal incluyen lo siguiente:  Chemical engineer.  Estreimiento.  Problemas para digerir los alimentos.  Comer en exceso.  Sndrome de colon irritable. Este cuadro clnico afecta al intestino grueso.  Intolerancia a Chief of Staff. Es la incapacidad para Counselling psychologist, un azcar natural en productos lcteos.  Enfermedad celaca. Esta es una enfermedad que afecta la capacidad de digerir gluten, una protena presente en algunos granos.  Gastroparesia. Esta es una afeccin que retrasa el movimiento de alimentos en el estmago e intestino delgado. Es ms frecuente en personas con diabetes mellitus.  Enfermedad de reflujo gastroesofgico (ERGE). Esta es una afeccin digestiva que  hace que el cido de su estmago vuelva al esfago.  Retencin urinaria. Esto significa que el cuerpo retiene la [place] y no se puede vaciar la vejiga por completo. Siga estas indicaciones en su casa: Comida y bebida  Evite comer en exceso.  Trate de no tragar aire cuando habla o come.  Evite ingerir alimentos cuando est acostado.  Evite estos alimentos y bebidas: ? Alimentos que pueden causar gases, como el brcoli, repollo, Dance movement psychotherapist y frijoles cocidos. ? Gaseosas. ? Caramelos duros. ? Psychologist, clinical. Medicamentos  [employer] de venta libre y los recetados solamente como se lo haya indicado el [place] medicamentos probiticos. Estos medicamentos contienen bacterias vivas o levadura que pueden ayudar a la digestin.  Tome cpsulas de aceite de menta recubiertas. Actividad  Trate de Optometrist ejercicio a diario. El ejercicio puede ayudarlo a disminuir la distensin que causan los gases y Theatre stage manager estreimiento. Indicaciones generales  Concurra a todas las visitas de control como se lo haya indicado el mdico. Esto es importante. Comunquese con un mdico si:  Tiene nuseas y vmitos.  Tiene diarrea.  Siente dolor abdominal.  Tiene prdida o aumento de peso fuera de lo comn.  Siente un dolor intenso que no puede controlar con los medicamentos. Solicite ayuda inmediatamente si:  Tiene un dolor intenso en el pecho.  Tiene dificultad para respirar.  Le falta el aire.  Tiene problemas para orinar.  La orina es ms oscura que lo normal.  Observa sangre en [place]  oscuras de aspecto alquitranado. Resumen  La distensin abdominal significa que el abdomen est hinchado.  Las causas ms comunes de esta afeccin son tragar [place], el estreimiento y los problemas para digerir alimentos.  Evite comer en exceso y tragar aire.  Evite alimentos que causan gases, bebidas con gas, caramelos duros y goma de mascar. Esta informacin no tiene  Marine scientist el consejo del mdico. Asegrese de hacerle al mdico cualquier pregunta que tenga. Document Revised: 09/10/2018 Document Reviewed: 09/10/2018 Elsevier Patient Education  2021 [employer].

## 2021-02-09 NOTE — Assessment & Plan Note (Signed)
Refill atorvastatin

## 2021-02-09 NOTE — Assessment & Plan Note (Signed)
Slowly improving no further steroids indicated

## 2021-02-09 NOTE — Assessment & Plan Note (Signed)
Improved refill steroid cream

## 2021-02-10 ENCOUNTER — Other Ambulatory Visit: Payer: Self-pay | Admitting: Critical Care Medicine

## 2021-02-10 ENCOUNTER — Other Ambulatory Visit: Payer: Self-pay

## 2021-02-10 DIAGNOSIS — E781 Pure hyperglyceridemia: Secondary | ICD-10-CM

## 2021-02-10 LAB — HEPATIC FUNCTION PANEL
ALT: 32 IU/L (ref 0–44)
AST: 25 IU/L (ref 0–40)
Albumin: 4.6 g/dL (ref 3.8–4.8)
Alkaline Phosphatase: 147 IU/L — ABNORMAL HIGH (ref 44–121)
Bilirubin Total: 0.9 mg/dL (ref 0.0–1.2)
Bilirubin, Direct: 0.15 mg/dL (ref 0.00–0.40)
Total Protein: 7.6 g/dL (ref 6.0–8.5)

## 2021-02-10 LAB — LIPID PANEL
Chol/HDL Ratio: 10.6 ratio — ABNORMAL HIGH (ref 0.0–5.0)
Cholesterol, Total: 264 mg/dL — ABNORMAL HIGH (ref 100–199)
HDL: 25 mg/dL — ABNORMAL LOW (ref >39)
Triglycerides: 870 mg/dL (ref 0–149)

## 2021-02-10 LAB — FECAL OCCULT BLOOD, IMMUNOCHEMICAL: Fecal Occult Bld: NEGATIVE

## 2021-02-10 MED ORDER — FENOFIBRATE 145 MG PO TABS
145.0000 mg | ORAL_TABLET | Freq: Every day | ORAL | 1 refills | Status: DC
  Filled 2021-02-10: qty 30, 30d supply, fill #0
  Filled 2021-02-17: qty 60, 60d supply, fill #0

## 2021-02-17 ENCOUNTER — Other Ambulatory Visit: Payer: Self-pay

## 2021-02-18 ENCOUNTER — Encounter: Payer: Self-pay | Admitting: *Deleted

## 2021-02-18 ENCOUNTER — Other Ambulatory Visit: Payer: Self-pay

## 2021-04-06 ENCOUNTER — Other Ambulatory Visit: Payer: Self-pay

## 2021-06-13 ENCOUNTER — Ambulatory Visit: Payer: Self-pay | Attending: Critical Care Medicine | Admitting: Critical Care Medicine

## 2021-06-13 ENCOUNTER — Other Ambulatory Visit: Payer: Self-pay

## 2021-06-13 ENCOUNTER — Encounter: Payer: Self-pay | Admitting: Critical Care Medicine

## 2021-06-13 VITALS — BP 141/87 | HR 64 | Temp 98.1°F | Ht 62.0 in | Wt 167.0 lb

## 2021-06-13 DIAGNOSIS — E785 Hyperlipidemia, unspecified: Secondary | ICD-10-CM

## 2021-06-13 DIAGNOSIS — Z23 Encounter for immunization: Secondary | ICD-10-CM

## 2021-06-13 DIAGNOSIS — K219 Gastro-esophageal reflux disease without esophagitis: Secondary | ICD-10-CM

## 2021-06-13 DIAGNOSIS — G8929 Other chronic pain: Secondary | ICD-10-CM

## 2021-06-13 DIAGNOSIS — N521 Erectile dysfunction due to diseases classified elsewhere: Secondary | ICD-10-CM

## 2021-06-13 DIAGNOSIS — M1710 Unilateral primary osteoarthritis, unspecified knee: Secondary | ICD-10-CM | POA: Insufficient documentation

## 2021-06-13 DIAGNOSIS — M179 Osteoarthritis of knee, unspecified: Secondary | ICD-10-CM | POA: Insufficient documentation

## 2021-06-13 DIAGNOSIS — I10 Essential (primary) hypertension: Secondary | ICD-10-CM

## 2021-06-13 DIAGNOSIS — E1165 Type 2 diabetes mellitus with hyperglycemia: Secondary | ICD-10-CM

## 2021-06-13 DIAGNOSIS — M1711 Unilateral primary osteoarthritis, right knee: Secondary | ICD-10-CM

## 2021-06-13 DIAGNOSIS — M25561 Pain in right knee: Secondary | ICD-10-CM

## 2021-06-13 LAB — POCT GLYCOSYLATED HEMOGLOBIN (HGB A1C): Hemoglobin A1C: 7.3 % — AB (ref 4.0–5.6)

## 2021-06-13 MED ORDER — AMLODIPINE BESYLATE 10 MG PO TABS
ORAL_TABLET | Freq: Every day | ORAL | 1 refills | Status: DC
  Filled 2021-06-13: qty 30, 30d supply, fill #0

## 2021-06-13 MED ORDER — TADALAFIL 10 MG PO TABS
10.0000 mg | ORAL_TABLET | ORAL | 1 refills | Status: DC | PRN
  Filled 2021-06-13: qty 10, 20d supply, fill #0

## 2021-06-13 MED ORDER — OMEPRAZOLE 40 MG PO CPDR
DELAYED_RELEASE_CAPSULE | Freq: Every day | ORAL | 3 refills | Status: DC
  Filled 2021-06-13: qty 30, 30d supply, fill #0

## 2021-06-13 MED ORDER — METFORMIN HCL 1000 MG PO TABS
1000.0000 mg | ORAL_TABLET | Freq: Every day | ORAL | 3 refills | Status: DC
  Filled 2021-06-13: qty 30, 30d supply, fill #0

## 2021-06-13 MED ORDER — FENOFIBRATE 145 MG PO TABS
145.0000 mg | ORAL_TABLET | Freq: Every day | ORAL | 1 refills | Status: DC
  Filled 2021-06-13: qty 30, 30d supply, fill #0

## 2021-06-13 MED ORDER — ATORVASTATIN CALCIUM 10 MG PO TABS
ORAL_TABLET | Freq: Every day | ORAL | 3 refills | Status: DC
  Filled 2021-06-13: qty 30, 30d supply, fill #0

## 2021-06-13 MED ORDER — VALSARTAN 160 MG PO TABS
ORAL_TABLET | Freq: Every day | ORAL | 1 refills | Status: DC
  Filled 2021-06-13: qty 30, 30d supply, fill #0

## 2021-06-13 NOTE — Assessment & Plan Note (Signed)
Slightly elevated today but has been off medicine for the past 3 weeks. Plan to refill today and discussed with patient to call the pharmacy for refill when he runs out of medicine. Plan to continue amlodipine '10mg'$  and valsartan '160mg'$ .

## 2021-06-13 NOTE — Assessment & Plan Note (Signed)
Triglycerides in April were over 800. Plan to continue atorvastatin and fenofibrate. Lipid panel was redrawn today. Given DASH diet.

## 2021-06-13 NOTE — Patient Instructions (Signed)
All medications were refilled and sent to our pharmacy  Follow a healthy diet as outlined on the attachment  A pneumonia vaccine was given  Please obtain a COVID booster vaccine  Referral to orthopedics was made for your right knee pain  Return to see Dr. Joya Gaskins 4 months   [facility] medicamentos fueron reabastecidos y enviados a [place].  Siga una dieta saludable como se describe en el archivo adjunto  Se administr una vacuna contra la neumona.  Obtenga una vacuna de refuerzo COVID  Se hizo derivacin a ortopedia por su dolor en la rodilla Research scientist (physical sciences) a ver al Dr. Joya Gaskins 4 meses

## 2021-06-13 NOTE — Assessment & Plan Note (Signed)
Atraumatic. Works in Designer, jewellery with repetitive motions. Saw ortho with negative labs and XR. Plan to continue naproxen prn. Also given handout for knee and leg strengthening.

## 2021-06-13 NOTE — Assessment & Plan Note (Signed)
Refill omeprazole '20mg'$  for daily use. No red flag symptoms identified .

## 2021-06-13 NOTE — Assessment & Plan Note (Signed)
On metformin '1000mg'$  BID only. Has been off medicine for the past 3 weeks and slight elevation in A1C likely due to being off meds. Plan to resume metformin 1000 mg BID.   Patient had normal physical exam and normal foot exam. He was given the name of an ophthalmologist and will make an appointment next week.

## 2021-06-13 NOTE — Assessment & Plan Note (Signed)
Refill tadalafil for prn use.

## 2021-06-13 NOTE — Progress Notes (Addendum)
Established Patient Office Visit  Subjective:  Patient ID: Walter Phillips, male    DOB: 04-27-1961  Age: 60 y.o. MRN: [id number]  CC:  Chief Complaint  Patient presents with   Follow-up    HPI  [employer] Walter Phillips used for spanish interpretation Walter Phillips presents for 4 month follow up on chronic conditions. Checking glucose daily at home and has been increasing his vegetable intake. Has been out of medicine for the past 3 weeks.   Also requesting a refill for tadalafil   Has home pulse ox and has been >96. When working and exerting himself he is short of breath. Getting tired at work easily. Has to sit and rest. Lasts for a few moments. Denies chest pain but feels "bloated". Denies syncope. Denies abdominal pain or NVD.   He uses his inhaler once every 2 weeks.   Complains of arthralgia in right knee. He saw ortho and had negative XR and labs. Denies trauma or injury but works in [employer]er, [employer]ellery with repetitive motions. Given naproxen with some relief. Does not wear brace or ice pack.   Past Medical History:  Diagnosis Date   Chronic respiratory failure with hypoxia ([place]) 10/15/2019   Diabetes mellitus, type II ([place])    Dyslipidemia     Past Surgical History:  Procedure Laterality Date   APPENDECTOMY     LAPAROSCOPIC CHOLECYSTECTOMY     LAPAROSCOPIC SIGMOID COLECTOMY     VENTRAL HERNIA REPAIR  05/27/2014    Family History  Problem Relation Age of Onset   Early death Mother        childbirth   Cancer Father    Stomach cancer Sister 57   Stroke Sister    Diabetes Daughter     Social History   Socioeconomic History   Marital status: Married    Spouse name: Not on file   Number of children: Not on file   Years of education: Not on file   Highest education level: Not on file  Occupational History   Not on file  Tobacco Use   Smoking status: Former    Packs/day: 0.50    Types: Cigarettes   Smokeless tobacco: Never  Vaping Use   Vaping Use:  Never used  Substance and Sexual Activity   Alcohol use: Yes    Comment: occ   Drug use: No   Sexual activity: Not on file  Other Topics Concern   Not on file  Social History Narrative   Not on file   Social Determinants of Health   Financial Resource Strain: Not on file  Food Insecurity: Not on file  Transportation Needs: Not on file  Physical Activity: Not on file  Stress: Not on file  Social Connections: Not on file  Intimate Partner Violence: Not on file    Outpatient Medications Prior to Visit  Medication Sig Dispense Refill   albuterol (VENTOLIN HFA) 108 (90 Base) MCG/ACT inhaler Inhale 1-2 puffs into the lungs every 4 (four) hours as needed for wheezing or shortness of breath. 8 g 1   Blood Glucose Monitoring Suppl (TRUE METRIX METER) w/Device KIT Use to measure blood sugar twice a day 1 kit 0   clobetasol cream (TEMOVATE) 0.05 % Apply topically 2 (two) times daily. 30 g 1   glucose blood (TRUE METRIX BLOOD GLUCOSE TEST) test strip Use as instructed 100 each 12   olopatadine (PATANOL) 0.1 % ophthalmic solution Place 1 drop into the left eye 2 (two) times daily. 5 mL  0   TRUEplus Lancets 28G MISC Use to measure blood sugar twice a day 100 each 1   amLODipine (NORVASC) 10 MG tablet TAKE 1 TABLET (10 MG TOTAL) BY MOUTH DAILY. 90 tablet 1   atorvastatin (LIPITOR) 10 MG tablet TAKE 1 TABLET (10 MG TOTAL) BY MOUTH DAILY. 90 tablet 3   fenofibrate (TRICOR) 145 MG tablet Take 1 tablet (145 mg total) by mouth daily. 60 tablet 1   metFORMIN (GLUCOPHAGE) 1000 MG tablet Take 1 tablet (1,000 mg total) by mouth daily with breakfast. 90 tablet 3   omeprazole (PRILOSEC) 40 MG capsule TAKE 1 CAPSULE (40 MG TOTAL) BY MOUTH DAILY. 30 capsule 3   valsartan (DIOVAN) 160 MG tablet TAKE 1 TABLET (160 MG TOTAL) BY MOUTH DAILY. 90 tablet 1   No facility-administered medications prior to visit.    No Known Allergies  ROS Review of Systems  Constitutional: Negative.  Negative for diaphoresis  and fever.  HENT: Negative.    Eyes: Negative.   Respiratory:  Positive for shortness of breath. Negative for cough and chest tightness.   Cardiovascular: Negative.  Negative for chest pain and leg swelling.  Gastrointestinal:  Positive for abdominal distention. Negative for abdominal pain, constipation, diarrhea, nausea and vomiting.  Endocrine: Negative.   Genitourinary: Negative.   Musculoskeletal:  Positive for arthralgias (right knee).  Skin: Negative.   Neurological: Negative.   Hematological: Negative.   Psychiatric/Behavioral: Negative.       Objective:    Physical Exam Constitutional:      General: He is not in acute distress.    Appearance: Normal appearance. He is normal weight. He is not ill-appearing.  HENT:     Head: Normocephalic.     Mouth/Throat:     Mouth: Mucous membranes are moist.     Pharynx: Oropharynx is clear.  Eyes:     Pupils: Pupils are equal, round, and reactive to light.  Cardiovascular:     Rate and Rhythm: Normal rate and regular rhythm.     Pulses: Normal pulses.     Heart sounds: Normal heart sounds.  Pulmonary:     Effort: Pulmonary effort is normal.     Breath sounds: Normal breath sounds.  Abdominal:     General: Abdomen is flat. Bowel sounds are normal.     Tenderness: There is no abdominal tenderness.  Musculoskeletal:        General: No tenderness.     Cervical back: Neck supple.     Right knee: Effusion present. No swelling, deformity, erythema, ecchymosis, lacerations, bony tenderness or crepitus. Decreased range of motion. No tenderness. Normal alignment, normal meniscus and normal patellar mobility.     Right lower leg: Normal. No edema.     Left lower leg: Normal. No edema.  Neurological:     General: No focal deficit present.     Mental Status: He is alert.  Psychiatric:        Mood and Affect: Mood normal.        Behavior: Behavior normal.    BP (!) 141/87 (BP Location: Left Arm, Patient Position: Sitting, Cuff Size:  Normal)   Pulse 64   Temp 98.1 F (36.7 C) (Oral)   Ht '5\' 2"'  (1.575 m)   Wt 167 lb (75.8 kg)   SpO2 94%   BMI 30.54 kg/m  Wt Readings from Last 3 Encounters:  06/13/21 167 lb (75.8 kg)  02/09/21 168 lb 3.2 oz (76.3 kg)  11/11/20 168 lb (76.2 kg)  Health Maintenance Due  Topic Date Due   OPHTHALMOLOGY EXAM  Never done   Zoster Vaccines- Shingrix (1 of 2) Never done   COVID-19 Vaccine (4 - Booster for Pfizer series) 01/04/2021   Pneumococcal Vaccine 76-76 Years old (2 - PCV) 01/12/2021   INFLUENZA VACCINE  05/30/2021    There are no preventive care reminders to display for this patient.  No results found for: TSH Lab Results  Component Value Date   WBC 5.1 05/19/2020   HGB 15.6 05/19/2020   HCT 47.1 05/19/2020   MCV 90 05/19/2020   PLT 199 05/19/2020   Lab Results  Component Value Date   NA 140 11/10/2019   K 3.9 11/10/2019   CO2 26 11/10/2019   GLUCOSE 110 (H) 11/10/2019   BUN 10 11/10/2019   CREATININE 0.47 (L) 11/10/2019   BILITOT 0.9 02/09/2021   ALKPHOS 147 (H) 02/09/2021   AST 25 02/09/2021   ALT 32 02/09/2021   PROT 7.6 02/09/2021   ALBUMIN 4.6 02/09/2021   CALCIUM 9.4 11/10/2019   ANIONGAP 10 11/07/2019   Lab Results  Component Value Date   CHOL 264 (H) 02/09/2021   Lab Results  Component Value Date   HDL 25 (L) 02/09/2021   Lab Results  Component Value Date   LDLCALC Comment (A) 02/09/2021   Lab Results  Component Value Date   TRIG 870 ([address]) 02/09/2021   Lab Results  Component Value Date   CHOLHDL 10.6 (H) 02/09/2021   Lab Results  Component Value Date   HGBA1C 7.3 (A) 06/13/2021      Assessment & Plan:   Problem List Items Addressed This Visit       Cardiovascular and Mediastinum   HTN (hypertension)    Slightly elevated today but has been off medicine for the past 3 weeks. Plan to refill today and discussed with patient to call the pharmacy for refill when he runs out of medicine. Plan to continue amlodipine 27m and  valsartan 1674m       Relevant Medications   amLODipine (NORVASC) 10 MG tablet   atorvastatin (LIPITOR) 10 MG tablet   fenofibrate (TRICOR) 145 MG tablet   valsartan (DIOVAN) 160 MG tablet   tadalafil (CIALIS) 10 MG tablet     Digestive   GERD (gastroesophageal reflux disease)    Refill omeprazole 2042mor daily use. No red flag symptoms identified .      Relevant Medications   omeprazole (PRILOSEC) 40 MG capsule     Endocrine   Uncontrolled type 2 diabetes mellitus with hyperglycemia ([facility]    On metformin 1000m28mD only. Has been off medicine for the past 3 weeks and slight elevation in A1C likely due to being off meds. Plan to resume metformin 1000 mg BID.   Patient had normal physical exam and normal foot exam. He was given the name of an ophthalmologist and will make an appointment next week.       Relevant Medications   atorvastatin (LIPITOR) 10 MG tablet   metFORMIN (GLUCOPHAGE) 1000 MG tablet   valsartan (DIOVAN) 160 MG tablet   Other Relevant Orders   Lipid panel   HgB A1c (Completed)     Musculoskeletal and Integument   OA (osteoarthritis) of knee    Atraumatic. Works in furnDesigner, jewelleryh repetitive motions. Saw ortho with negative labs and XR. Plan to continue naproxen prn. Also given handout for knee and leg strengthening.         Other  Dyslipidemia    Triglycerides in April were over 800. Plan to continue atorvastatin and fenofibrate. Lipid panel was redrawn today. Given DASH diet.       Relevant Medications   atorvastatin (LIPITOR) 10 MG tablet   fenofibrate (TRICOR) 145 MG tablet   Other Relevant Orders   Lipid panel   Erectile dysfunction and diabetes    Refill tadalafil for prn use.       Other Visit Diagnoses     Chronic pain of right knee       Relevant Orders   Ambulatory referral to Orthopedic Surgery   Need for pneumococcal vaccination       Relevant Orders   Pneumococcal conjugate vaccine 20-valent (Prevnar 20)  (Completed)       Meds ordered this encounter  Medications   amLODipine (NORVASC) 10 MG tablet    Sig: TAKE 1 TABLET (10 MG TOTAL) BY MOUTH DAILY.    Dispense:  90 tablet    Refill:  1   atorvastatin (LIPITOR) 10 MG tablet    Sig: TAKE 1 TABLET (10 MG TOTAL) BY MOUTH DAILY.    Dispense:  90 tablet    Refill:  3   fenofibrate (TRICOR) 145 MG tablet    Sig: Take 1 tablet (145 mg total) by mouth daily.    Dispense:  60 tablet    Refill:  1   metFORMIN (GLUCOPHAGE) 1000 MG tablet    Sig: Take 1 tablet (1,000 mg total) by mouth daily with breakfast.    Dispense:  90 tablet    Refill:  3   omeprazole (PRILOSEC) 40 MG capsule    Sig: TAKE 1 CAPSULE (40 MG TOTAL) BY MOUTH DAILY.    Dispense:  30 capsule    Refill:  3   valsartan (DIOVAN) 160 MG tablet    Sig: TAKE 1 TABLET (160 MG TOTAL) BY MOUTH DAILY.    Dispense:  90 tablet    Refill:  1   tadalafil (CIALIS) 10 MG tablet    Sig: Take 1 tablet (10 mg total) by mouth every other day as needed for erectile dysfunction.    Dispense:  10 tablet    Refill:  1    Follow-up: Return in about 4 months (around 10/13/2021).    Asencion Noble, MD

## 2021-06-13 NOTE — Progress Notes (Signed)
Pt is fasting  Has not taken any medications this morning  States he began having knee pain 6 weeks ago- was unable to get an appt here saw another provider had imaging and was given medication. He completed the medication and still having pain unable to bend right knee. Medication prescribed was naproxen. He was also administered injections

## 2021-06-14 ENCOUNTER — Other Ambulatory Visit: Payer: Self-pay

## 2021-06-15 ENCOUNTER — Other Ambulatory Visit: Payer: Self-pay

## 2021-06-24 ENCOUNTER — Ambulatory Visit: Payer: Self-pay | Admitting: Orthopaedic Surgery

## 2021-06-24 ENCOUNTER — Other Ambulatory Visit: Payer: Self-pay

## 2021-06-24 DIAGNOSIS — M25561 Pain in right knee: Secondary | ICD-10-CM

## 2021-06-24 NOTE — Progress Notes (Signed)
Office Visit Note   Patient: Walter Phillips           Date of Birth: 26-Dec-1960           MRN: [id number] Visit Date: 06/24/2021              Requested by: Elsie Stain, MD [address],  [place] [address] PCP: Elsie Stain, MD   Assessment & Plan: Visit Diagnoses:  1. Right knee pain, unspecified chronicity     Plan: Impression is right knee pain concerning for degenerative medial and lateral meniscus tears.  The patient has not yet tried cortisone injections have discussed proceeding with this.  He is agreeable to this plan.  If his symptoms persist over the next 3 to 4 weeks he will call us and let us know we will order an MRI of the right knee to assess for meniscal pathology.  Follow-Up Instructions: Return if symptoms worsen or fail to improve.   Orders:  Orders Placed This Encounter  Procedures   Large Joint Inj: R knee   No orders of the defined types were placed in this encounter.     Procedures: Large Joint Inj: R knee on 06/24/2021 10:04 AM Indications: pain Details: 22 G needle, anterolateral approach Medications: 2 mL lidocaine 1 %; 2 mL bupivacaine 0.25 %; 40 mg methylPREDNISolone acetate 40 MG/ML     Clinical Data: No additional findings.   Subjective: Chief Complaint  Patient presents with   Right Knee - New Patient (Initial Visit)    HPI Pleasant 60 year old Spanish-speaking gentleman is here today with interpreter.  He is here with right knee pain for the past 62 months.  No known injury or change in activity.  He notes that this began after driving in the car for about an hour and a half.  The pain started in his foot and began to radiate up his leg and stops at the knee.  He has had pain to the entire knee since.  Pain is worse with pivoting.  He has been taking NSAIDs without significant relief.  No previous cortisone injection.  He was seen by his primary care provider where he was referred out for x-rays.  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   Review of Systems as detailed in HPI.  All others reviewed and are negative.   Objective: Vital Signs: There were no vitals taken for this visit.  Physical Exam well-developed well-nourished gentleman in no acute distress.  Alert and oriented x3.  Ortho Exam right knee exam shows moderate medial and lateral joint line tenderness.  No effusion.  Range of motion 0 to 115 degrees.  Ligaments are stable.  He is neurovascular intact distally.  Specialty Comments:  No specialty comments available.  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   History of COVID-19 10/15/2019   Dyslipidemia 10/15/2019   Ventral hernia 04/17/2014   Past Medical History:  Diagnosis Date   Chronic respiratory failure with hypoxia ([place]) 10/15/2019   Diabetes mellitus, type II ([place])    Dyslipidemia     Family History  Problem Relation  Age of Onset   Early death Mother        childbirth   Cancer Father    Stomach cancer Sister 39   Stroke Sister    Diabetes Daughter     Past Surgical History:  Procedure Laterality Date   APPENDECTOMY     LAPAROSCOPIC CHOLECYSTECTOMY     LAPAROSCOPIC SIGMOID COLECTOMY     VENTRAL HERNIA REPAIR  05/27/2014   Social History   Occupational History   Not on file  Tobacco Use   Smoking status: Former    Packs/day: 0.50    Types: Cigarettes   Smokeless tobacco: Never  Vaping Use   Vaping Use: Never used  Substance and Sexual Activity   Alcohol use: Yes    Comment: occ   Drug use: No   Sexual activity: Not  on file

## 2021-06-25 MED ORDER — LIDOCAINE HCL 1 % IJ SOLN
2.0000 mL | INTRAMUSCULAR | Status: AC | PRN
  Administered 2021-06-24: 2 mL

## 2021-06-25 MED ORDER — BUPIVACAINE HCL 0.25 % IJ SOLN
2.0000 mL | INTRAMUSCULAR | Status: AC | PRN
Start: 2021-06-24 — End: 2021-06-24
  Administered 2021-06-24: 2 mL via INTRA_ARTICULAR

## 2021-06-25 MED ORDER — METHYLPREDNISOLONE ACETATE 40 MG/ML IJ SUSP
40.0000 mg | INTRAMUSCULAR | Status: AC | PRN
  Administered 2021-06-24: 40 mg via INTRA_ARTICULAR

## 2021-10-12 IMAGING — DX DG CHEST 1V PORT
1 series · 1 of 1 positions shown · non-contrast
Comparison: Radiograph 03/14/2007

CLINICAL DATA: Shortness of breath.  TFLIS-1Z positive.

EXAM:
PORTABLE CHEST 1 VIEW

[chest]
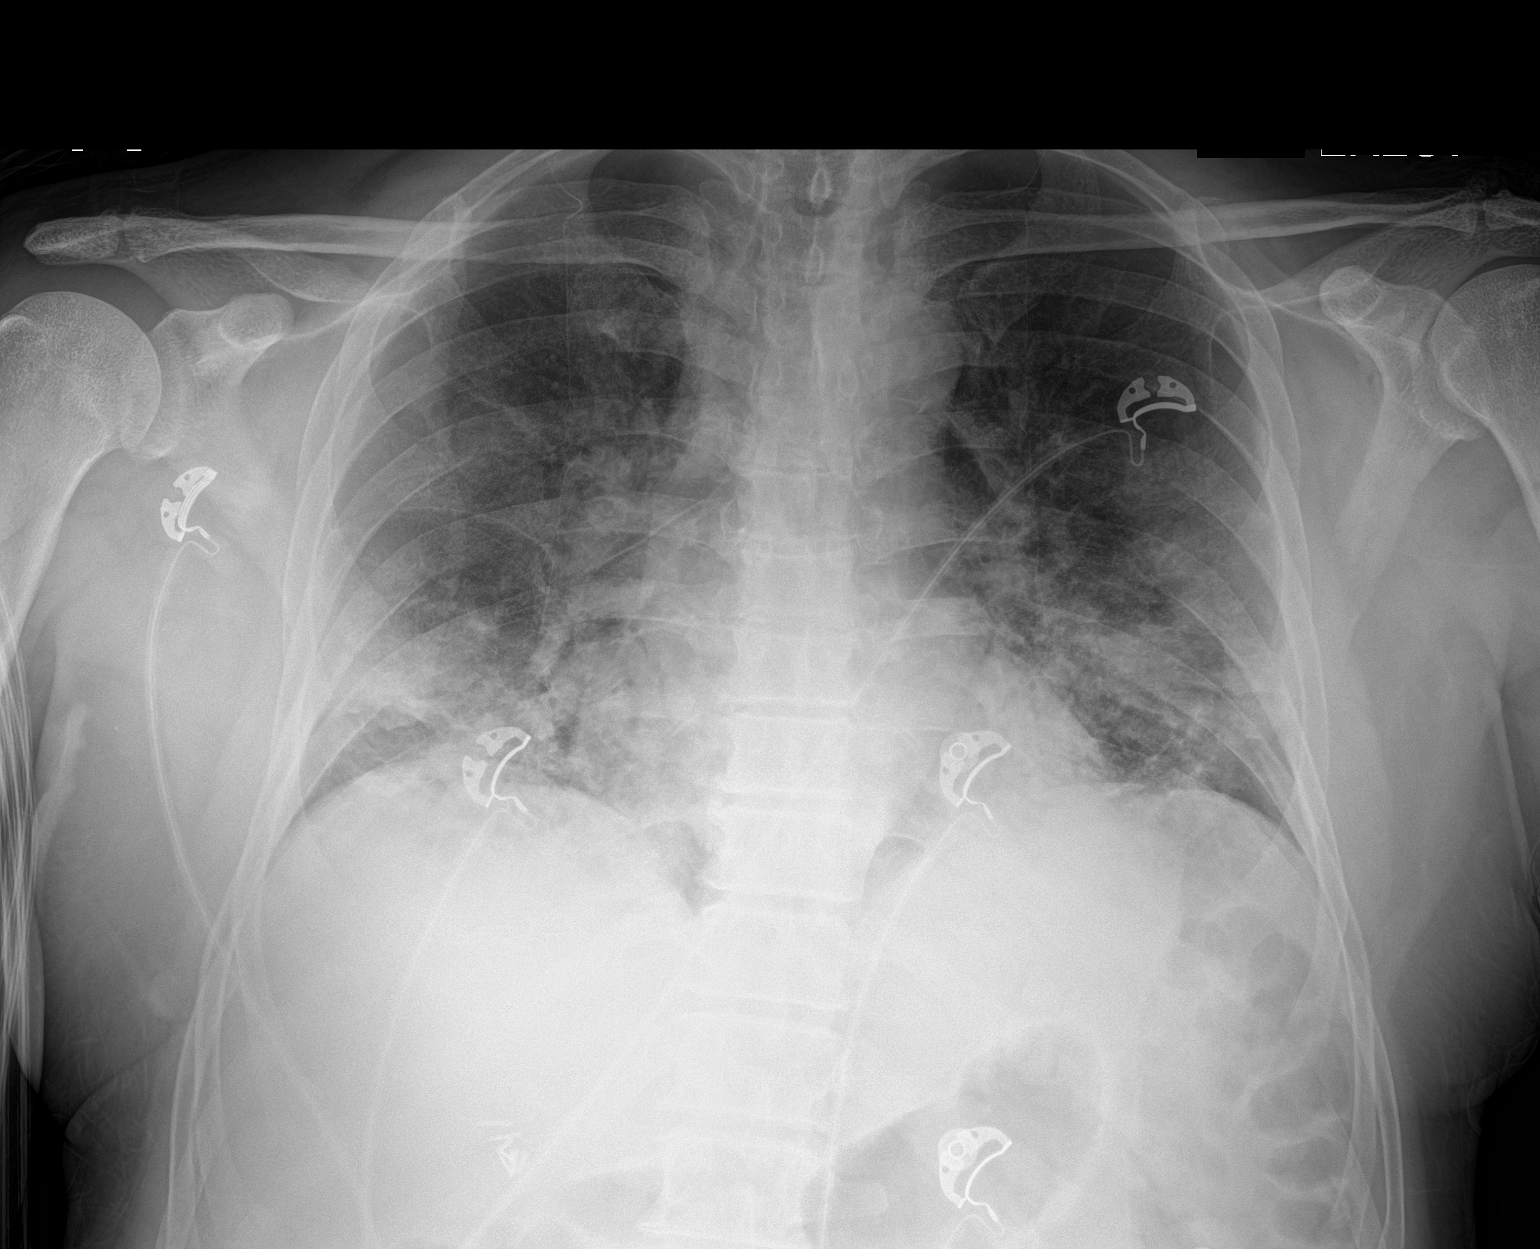

[1 of 1 positions shown; findings below may reference images not displayed]

FINDINGS: Low lung volumes. Prominent heterogeneous opacities throughout both
lungs, basilar predominant. Mild background fine interstitial
opacities. Heart is normal in size. Normal mediastinal contours. No
pleural fluid or pneumothorax. Overlying artifact projects over the
chest.
IMPRESSION: Low lung volumes with prominent heterogeneous opacities throughout
both lungs, consistent with pneumonia, pattern typical of TFLIS-1Z .

Background fine interstitial opacities which may represent
emphysema.

## 2021-10-13 ENCOUNTER — Other Ambulatory Visit: Payer: Self-pay

## 2021-10-13 ENCOUNTER — Encounter: Payer: Self-pay | Admitting: Critical Care Medicine

## 2021-10-13 ENCOUNTER — Ambulatory Visit: Payer: Self-pay | Attending: Critical Care Medicine | Admitting: Critical Care Medicine

## 2021-10-13 VITALS — BP 121/87 | HR 78 | Resp 16 | Wt 164.2 lb

## 2021-10-13 DIAGNOSIS — Z23 Encounter for immunization: Secondary | ICD-10-CM

## 2021-10-13 DIAGNOSIS — I10 Essential (primary) hypertension: Secondary | ICD-10-CM

## 2021-10-13 DIAGNOSIS — E781 Pure hyperglyceridemia: Secondary | ICD-10-CM

## 2021-10-13 DIAGNOSIS — J8410 Pulmonary fibrosis, unspecified: Secondary | ICD-10-CM

## 2021-10-13 DIAGNOSIS — E1165 Type 2 diabetes mellitus with hyperglycemia: Secondary | ICD-10-CM

## 2021-10-13 LAB — POCT GLYCOSYLATED HEMOGLOBIN (HGB A1C): HbA1c, POC (controlled diabetic range): 8.2 % — AB (ref 0.0–7.0)

## 2021-10-13 LAB — GLUCOSE, POCT (MANUAL RESULT ENTRY): POC Glucose: 171 mg/dl — AB (ref 70–99)

## 2021-10-13 MED ORDER — ATORVASTATIN CALCIUM 10 MG PO TABS
ORAL_TABLET | Freq: Every day | ORAL | 3 refills | Status: DC
  Filled 2021-10-13 (×2): qty 30, 30d supply, fill #0
  Filled 2021-11-29: qty 90, 90d supply, fill #0

## 2021-10-13 MED ORDER — CLOBETASOL PROPIONATE 0.05 % EX CREA
TOPICAL_CREAM | Freq: Two times a day (BID) | CUTANEOUS | 1 refills | Status: DC
Start: 2021-10-13 — End: 2022-04-13
  Filled 2021-10-13: qty 30, 15d supply, fill #0

## 2021-10-13 MED ORDER — OMEPRAZOLE 40 MG PO CPDR
DELAYED_RELEASE_CAPSULE | Freq: Every day | ORAL | 3 refills | Status: DC
  Filled 2021-10-13 – 2021-11-29 (×2): qty 30, 30d supply, fill #0

## 2021-10-13 MED ORDER — TRULICITY 0.75 MG/0.5ML ~~LOC~~ SOAJ
0.7500 mg | SUBCUTANEOUS | 4 refills | Status: DC
Start: 2021-10-13 — End: 2021-11-29
  Filled 2021-10-13: qty 2, 28d supply, fill #0

## 2021-10-13 MED ORDER — METFORMIN HCL 1000 MG PO TABS
1000.0000 mg | ORAL_TABLET | Freq: Every day | ORAL | 3 refills | Status: DC
  Filled 2021-10-13: qty 30, 30d supply, fill #0
  Filled 2021-11-29: qty 90, 90d supply, fill #0

## 2021-10-13 MED ORDER — FENOFIBRATE 145 MG PO TABS
145.0000 mg | ORAL_TABLET | Freq: Every day | ORAL | 1 refills | Status: DC
  Filled 2021-10-13 – 2021-11-29 (×2): qty 30, 30d supply, fill #0

## 2021-10-13 MED ORDER — TADALAFIL 10 MG PO TABS
10.0000 mg | ORAL_TABLET | ORAL | 1 refills | Status: DC | PRN
Start: 2021-10-13 — End: 2022-04-13
  Filled 2021-10-13 – 2021-12-14 (×2): qty 10, 30d supply, fill #0

## 2021-10-13 MED ORDER — VALSARTAN 160 MG PO TABS
ORAL_TABLET | Freq: Every day | ORAL | 1 refills | Status: DC
  Filled 2021-10-13 – 2021-11-29 (×2): qty 30, 30d supply, fill #0

## 2021-10-13 MED ORDER — AMLODIPINE BESYLATE 10 MG PO TABS
ORAL_TABLET | Freq: Every day | ORAL | 1 refills | Status: DC
  Filled 2021-10-13: qty 30, 30d supply, fill #0
  Filled 2021-11-29: qty 90, 90d supply, fill #0

## 2021-10-13 NOTE — Assessment & Plan Note (Signed)
Hypertension stable at this time Continue amlodipine and valsartan

## 2021-10-13 NOTE — Assessment & Plan Note (Signed)
Patient maintains fenofibrate in addition to statin

## 2021-10-13 NOTE — Assessment & Plan Note (Signed)
Pulmonary fibrosis post COVID  Continue albuterol as needed

## 2021-10-13 NOTE — Progress Notes (Signed)
Established Patient Office Visit  Subjective:  Patient ID: Walter Phillips, male    DOB: [birth year]  Age: 60 y.o. MRN: [id number]  CC:  Chief Complaint  Patient presents with   Diabetes    HPI Walter Phillips presents for primary care follow-up for diabetes.  On arrival blood sugar 171 and A1c is 8.2 which is up from 7.3.  Patient maintains metformin alone.  He states his blood sugars at home are anywhere from 146-170.  He eats eggs with vegetables and bread for breakfast he has chicken soup or sometimes a meat dish for lunch he has a meat dish for supper.  He does not have much in the way of vegetables in his diet.  He is eating a lot of carbohydrates.  Patient does not have an exercise program at this time.  History of COVID-pneumonia with mild interstitial disease.  He still having some chest tightness and is relieved with albuterol.  His breathing is not significantly worsened.  Patient has had all his vaccinations at this time.   Past Medical History:  Diagnosis Date   Chronic respiratory failure with hypoxia ([place]) 10/15/2019   Diabetes mellitus, type II ([place])    Dyslipidemia     Past Surgical History:  Procedure Laterality Date   APPENDECTOMY     LAPAROSCOPIC CHOLECYSTECTOMY     LAPAROSCOPIC SIGMOID COLECTOMY     VENTRAL HERNIA REPAIR  05/27/2014    Family History  Problem Relation Age of Onset   Early death Mother        childbirth   Cancer Father    Stomach cancer Sister 53   Stroke Sister    Diabetes Daughter     Social History   Socioeconomic History   Marital status: Married    Spouse name: Not on file   Number of children: Not on file   Years of education: Not on file   Highest education level: Not on file  Occupational History   Not on file  Tobacco Use   Smoking status: Former    Packs/day: 0.50    Types: Cigarettes   Smokeless tobacco: Never  Vaping Use   Vaping Use: Never used  Substance and Sexual Activity   Alcohol use: Yes    Comment:  occ   Drug use: No   Sexual activity: Not on file  Other Topics Concern   Not on file  Social History Narrative   Not on file   Social Determinants of Health   Financial Resource Strain: Not on file  Food Insecurity: Not on file  Transportation Needs: Not on file  Physical Activity: Not on file  Stress: Not on file  Social Connections: Not on file  Intimate Partner Violence: Not on file    Outpatient Medications Prior to Visit  Medication Sig Dispense Refill   albuterol (VENTOLIN HFA) 108 (90 Base) MCG/ACT inhaler Inhale 1-2 puffs into the lungs every 4 (four) hours as needed for wheezing or shortness of breath. 8 g 1   Blood Glucose Monitoring Suppl (TRUE METRIX METER) w/Device KIT Use to measure blood sugar twice a day 1 kit 0   glucose blood (TRUE METRIX BLOOD GLUCOSE TEST) test strip Use as instructed 100 each 12   olopatadine (PATANOL) 0.1 % ophthalmic solution Place 1 drop into the left eye 2 (two) times daily. 5 mL 0   TRUEplus Lancets 28G MISC Use to measure blood sugar twice a day 100 each 1   amLODipine (NORVASC) 10 MG tablet TAKE  1 TABLET (10 MG TOTAL) BY MOUTH DAILY. 90 tablet 1   atorvastatin (LIPITOR) 10 MG tablet TAKE 1 TABLET (10 MG TOTAL) BY MOUTH DAILY. 90 tablet 3   clobetasol cream (TEMOVATE) 0.05 % Apply topically 2 (two) times daily. 30 g 1   fenofibrate (TRICOR) 145 MG tablet Take 1 tablet (145 mg total) by mouth daily. 60 tablet 1   metFORMIN (GLUCOPHAGE) 1000 MG tablet Take 1 tablet (1,000 mg total) by mouth daily with breakfast. 90 tablet 3   omeprazole (PRILOSEC) 40 MG capsule TAKE 1 CAPSULE (40 MG TOTAL) BY MOUTH DAILY. 30 capsule 3   tadalafil (CIALIS) 10 MG tablet Take 1 tablet (10 mg total) by mouth every other day as needed for erectile dysfunction. 10 tablet 1   valsartan (DIOVAN) 160 MG tablet TAKE 1 TABLET (160 MG TOTAL) BY MOUTH DAILY. 90 tablet 1   No facility-administered medications prior to visit.    No Known Allergies  ROS Review of  Systems  Constitutional:  Positive for fatigue. Negative for chills, diaphoresis and fever.  HENT:  Negative for congestion, hearing loss, nosebleeds, sore throat and tinnitus.   Eyes:  Negative for photophobia and redness.  Respiratory:  Positive for chest tightness. Negative for cough, shortness of breath, wheezing and stridor.   Cardiovascular:  Negative for chest pain, palpitations and leg swelling.  Gastrointestinal:  Negative for abdominal pain, blood in stool, constipation, diarrhea, nausea and vomiting.       Burp exess  Endocrine: Negative for polydipsia.  Genitourinary:  Negative for dysuria, flank pain, frequency, hematuria and urgency.  Musculoskeletal:  Negative for back pain, myalgias and neck pain.  Skin:  Negative for rash.  Allergic/Immunologic: Negative for environmental allergies.  Neurological:  Negative for dizziness, tremors, seizures, weakness and headaches.  Hematological:  Does not bruise/bleed easily.  Psychiatric/Behavioral:  Negative for suicidal ideas. The patient is not nervous/anxious.      Objective:    Physical Exam Vitals reviewed.  Constitutional:      Appearance: Normal appearance. He is well-developed. He is not diaphoretic.  HENT:     Head: Normocephalic and atraumatic.     Nose: Nose normal. No nasal deformity, septal deviation, mucosal edema or rhinorrhea.     Right Sinus: No maxillary sinus tenderness or frontal sinus tenderness.     Left Sinus: No maxillary sinus tenderness or frontal sinus tenderness.     Mouth/Throat:     Mouth: Mucous membranes are dry.     Pharynx: Oropharynx is clear. No oropharyngeal exudate.  Eyes:     General: No scleral icterus.    Conjunctiva/sclera: Conjunctivae normal.     Pupils: Pupils are equal, round, and reactive to light.  Neck:     Thyroid: No thyromegaly.     Vascular: No carotid bruit or JVD.     Trachea: Trachea normal. No tracheal tenderness or tracheal deviation.  Cardiovascular:     Rate and  Rhythm: Normal rate and regular rhythm.     Chest Wall: PMI is not displaced.     Pulses: Normal pulses. No decreased pulses.     Heart sounds: Normal heart sounds, S1 normal and S2 normal. Heart sounds not distant. No murmur heard. No systolic murmur is present.  No diastolic murmur is present.    No friction rub. No gallop. No S3 or S4 sounds.  Pulmonary:     Effort: Pulmonary effort is normal. No tachypnea, accessory muscle usage or respiratory distress.     Breath sounds:  No stridor. Rales present. No decreased breath sounds, wheezing or rhonchi.     Comments: Dry rales at the right base Chest:     Chest wall: No tenderness.  Abdominal:     General: Bowel sounds are normal. There is no distension.     Palpations: Abdomen is soft. Abdomen is not rigid.     Tenderness: There is no abdominal tenderness. There is no guarding or rebound.  Musculoskeletal:        General: Normal range of motion.     Cervical back: Normal range of motion and neck supple. No edema, erythema or rigidity. No muscular tenderness. Normal range of motion.  Lymphadenopathy:     Head:     Right side of head: No submental or submandibular adenopathy.     Left side of head: No submental or submandibular adenopathy.     Cervical: No cervical adenopathy.  Skin:    General: Skin is warm and dry.     Coloration: Skin is not pale.     Findings: No rash.     Nails: There is no clubbing.  Neurological:     General: No focal deficit present.     Mental Status: He is alert and oriented to person, place, and time. Mental status is at baseline.     Cranial Nerves: No cranial nerve deficit.     Sensory: No sensory deficit.  Psychiatric:        Mood and Affect: Mood normal.        Speech: Speech normal.        Behavior: Behavior normal.        Thought Content: Thought content normal.        Judgment: Judgment normal.    BP 121/87    Pulse 78    Resp 16    Wt 164 lb 3.2 oz (74.5 kg)    SpO2 93%    BMI 30.03 kg/m   Wt Readings from Last 3 Encounters:  10/13/21 164 lb 3.2 oz (74.5 kg)  06/13/21 167 lb (75.8 kg)  02/09/21 168 lb 3.2 oz (76.3 kg)     Health Maintenance Due  Topic Date Due   OPHTHALMOLOGY EXAM  Never done    There are no preventive care reminders to display for this patient.  No results found for: TSH Lab Results  Component Value Date   WBC 5.1 05/19/2020   HGB 15.6 05/19/2020   HCT 47.1 05/19/2020   MCV 90 05/19/2020   PLT 199 05/19/2020   Lab Results  Component Value Date   NA 140 11/10/2019   K 3.9 11/10/2019   CO2 26 11/10/2019   GLUCOSE 110 (H) 11/10/2019   BUN 10 11/10/2019   CREATININE 0.47 (L) 11/10/2019   BILITOT 0.9 02/09/2021   ALKPHOS 147 (H) 02/09/2021   AST 25 02/09/2021   ALT 32 02/09/2021   PROT 7.6 02/09/2021   ALBUMIN 4.6 02/09/2021   CALCIUM 9.4 11/10/2019   ANIONGAP 10 11/07/2019   Lab Results  Component Value Date   CHOL 264 (H) 02/09/2021   Lab Results  Component Value Date   HDL 25 (L) 02/09/2021   Lab Results  Component Value Date   LDLCALC Comment (A) 02/09/2021   Lab Results  Component Value Date   TRIG 870 ([place]) 02/09/2021   Lab Results  Component Value Date   CHOLHDL 10.6 (H) 02/09/2021   Lab Results  Component Value Date   HGBA1C 8.2 (A) 10/13/2021  Assessment & Plan:   Problem List Items Addressed This Visit       Cardiovascular and Mediastinum   HTN (hypertension)    Hypertension stable at this time Continue amlodipine and valsartan       Relevant Medications   amLODipine (NORVASC) 10 MG tablet   atorvastatin (LIPITOR) 10 MG tablet   fenofibrate (TRICOR) 145 MG tablet   tadalafil (CIALIS) 10 MG tablet   valsartan (DIOVAN) 160 MG tablet     Respiratory   Postinflammatory pulmonary fibrosis (HCC)    Pulmonary fibrosis post COVID  Continue albuterol as needed        Endocrine   Uncontrolled type 2 diabetes mellitus with hyperglycemia (HCC) - Primary    Worsening type 2 diabetes A1c up to  8  Begin Trulicity 8.29 mg weekly and continue metformin 1000 mg twice daily  Patient instructed as to proper use of the Trulicity pen  Have the patient return in short-term follow-up 3 weeks with clinical pharmacy Dr. Joya Gaskins in 2 months  Patient reeducated as to proper healthy diet and exercise program      Relevant Medications   atorvastatin (LIPITOR) 10 MG tablet   metFORMIN (GLUCOPHAGE) 1000 MG tablet   valsartan (DIOVAN) 160 MG tablet   Dulaglutide (TRULICITY) 5.62 ZH/0.8MV SOPN   Other Relevant Orders   POCT glucose (manual entry) (Completed)   POCT glycosylated hemoglobin (Hb A1C) (Completed)     Other   Hypertriglyceridemia    Patient maintains fenofibrate in addition to statin      Relevant Medications   amLODipine (NORVASC) 10 MG tablet   atorvastatin (LIPITOR) 10 MG tablet   fenofibrate (TRICOR) 145 MG tablet   tadalafil (CIALIS) 10 MG tablet   valsartan (DIOVAN) 160 MG tablet   Other Visit Diagnoses     Need for immunization against influenza       Relevant Orders   Flu Vaccine QUAD 11moIM (Fluarix, Fluzone & Alfiuria Quad PF) (Completed)       Meds ordered this encounter  Medications   amLODipine (NORVASC) 10 MG tablet    Sig: TAKE 1 TABLET (10 MG TOTAL) BY MOUTH DAILY.    Dispense:  90 tablet    Refill:  1   atorvastatin (LIPITOR) 10 MG tablet    Sig: TAKE 1 TABLET (10 MG TOTAL) BY MOUTH DAILY.    Dispense:  90 tablet    Refill:  3   clobetasol cream (TEMOVATE) 0.05 %    Sig: Apply topically 2 (two) times daily.    Dispense:  30 g    Refill:  1   fenofibrate (TRICOR) 145 MG tablet    Sig: Take 1 tablet (145 mg total) by mouth daily.    Dispense:  60 tablet    Refill:  1   metFORMIN (GLUCOPHAGE) 1000 MG tablet    Sig: Take 1 tablet (1,000 mg total) by mouth daily with breakfast.    Dispense:  90 tablet    Refill:  3   omeprazole (PRILOSEC) 40 MG capsule    Sig: TAKE 1 CAPSULE (40 MG TOTAL) BY MOUTH DAILY.    Dispense:  30 capsule     Refill:  3   tadalafil (CIALIS) 10 MG tablet    Sig: Take 1 tablet (10 mg total) by mouth every other day as needed for erectile dysfunction.    Dispense:  10 tablet    Refill:  1   valsartan (DIOVAN) 160 MG tablet    Sig: TAKE 1  TABLET (160 MG TOTAL) BY MOUTH DAILY.    Dispense:  90 tablet    Refill:  1   Dulaglutide (TRULICITY) 5.52 ZG/9.4QX SOPN    Sig: Inject 0.75 mg into the skin once a week.    Dispense:  2 mL    Refill:  4    PASS     Follow-up: Return in about 2 months (around 12/14/2021).    Asencion Noble, MD

## 2021-10-13 NOTE — Assessment & Plan Note (Signed)
Worsening type 2 diabetes A1c up to 8  Begin Trulicity 5.02 mg weekly and continue metformin 1000 mg twice daily  Patient instructed as to proper use of the Trulicity pen  Have the patient return in short-term follow-up 3 weeks with clinical pharmacy Dr. Joya Gaskins in 2 months  Patient reeducated as to proper healthy diet and exercise program

## 2021-10-13 NOTE — Patient Instructions (Signed)
Stay on albuterol inhaler as needed  Refills on all your medications sent to our pharmacy  Begin Trulicity 1 injection once a week see attachment and per clinical pharmacist instructions  See Lurena Joiner our clinical pharmacist in follow-up in the next 2 to 3 weeks  Return to see Dr. Joya Gaskins in 2 months  Follow a healthy diet as we discussed and remember to exercise 30 minutes once a day for 4 times weekly  Flu vaccine was given  Mantngase en el inhalador de albuterol segn sea necesario  Resurtidos de todos sus medicamentos enviados a nuestra farmacia  Comience la inyeccin de Trulicity 1 una vez a [employer]. Consulte el archivo adjunto y [place] instrucciones del farmacutico clnico.  Consulte a Lurena Joiner, nuestro farmacutico clnico, en seguimiento en las prximas 2 a 3 semanas.  Volver a ver al Dr. Joya Gaskins en 2 meses  Siga una dieta saludable como comentamos y recuerde hacer ejercicio 30 minutos una vez al da durante 4 veces por [employer].  Se administr la vacuna contra la influenza

## 2021-11-04 IMAGING — CR DG CHEST 2V
2 series · 2 of 2 positions shown · non-contrast
Comparison: October 15, 2019

CLINICAL DATA: Shortness of breath.

EXAM:
CHEST - 2 VIEW

[w chest pa]
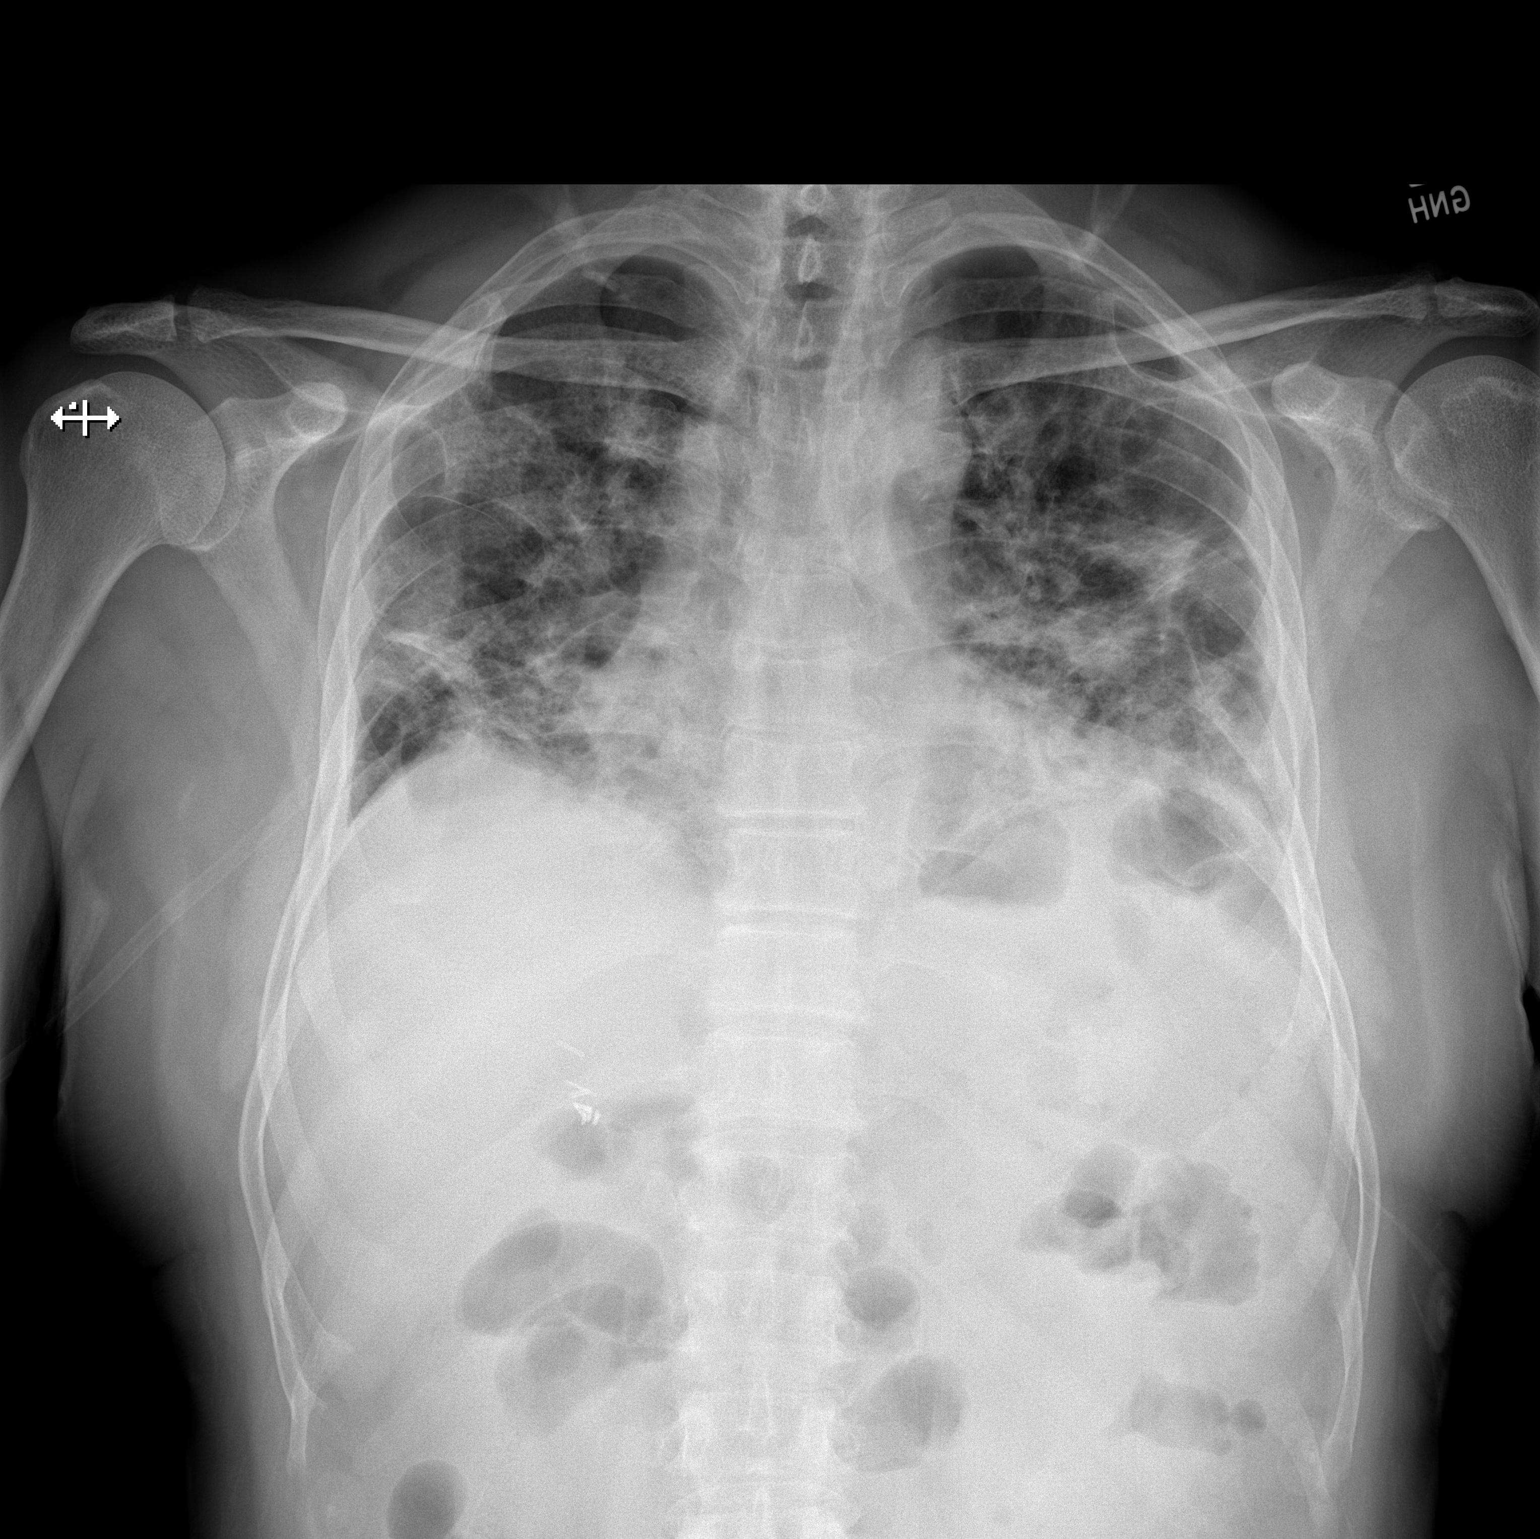

[w chest lat]
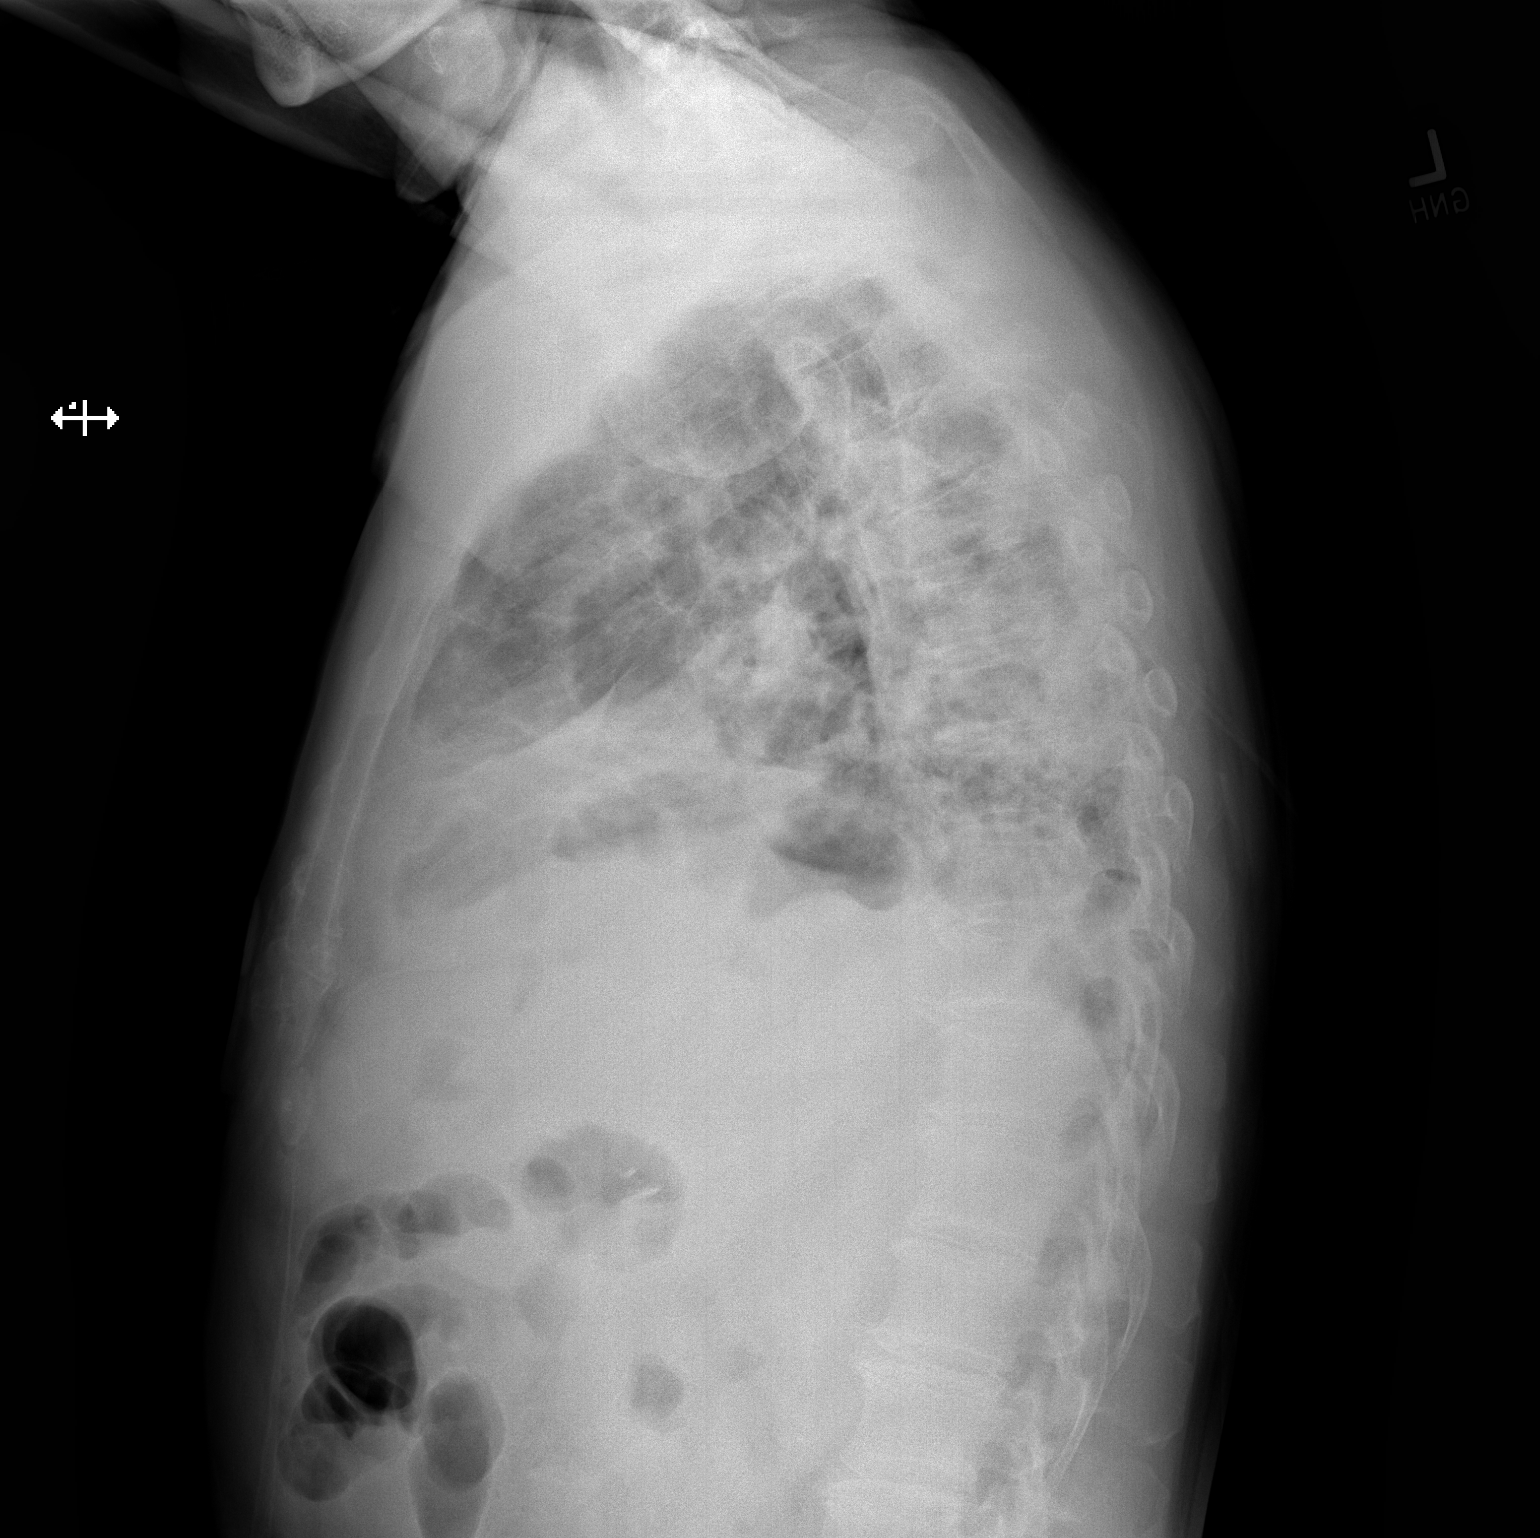

[2 of 2 positions shown; findings below may reference images not displayed]

FINDINGS: Marked severity patchy bilateral infiltrates are seen. This is
slightly more prominent within the mid left lung and bilateral lung
bases and is increased in severity when compared to the prior study.
There is no evidence of a pleural effusion or pneumothorax. The
heart size and mediastinal contours are within normal limits. The
visualized skeletal structures are unremarkable. Radiopaque surgical
clips are seen overlying the right upper quadrant.
IMPRESSION: Marked severity patchy bilateral infiltrates, increased in severity
when compared to the prior study dated October 15, 2019.

## 2021-11-10 ENCOUNTER — Ambulatory Visit: Payer: Self-pay | Admitting: Pharmacist

## 2021-11-11 NOTE — Progress Notes (Unsigned)
° ° °  S:    Patient presents for diabetes evaluation, education, and management Patient was referred and last seen by Primary Care Provider, Dr. Joya Gaskins, on 10/13/21 at which time Trulicity was started.   *** -next pcp visit 12/14/21 -update bmet? Last in 2021? Also does not have current ldl -titrate trulicity if doing well on it  Patient reports Diabetes was diagnosed in ***.   Family/Social History: DM in daughter. Former smoker.   Insurance coverage/medication affordability: self pay  Medication adherence reported *** .   Current diabetes medications include: metformin 8768 mg BID, Trulicity 1.15 mg weekly Current hypertension medications include: valsartan 160 mg daily, amlodipine 10 mg daily Current hyperlipidemia medications include: atorvastatin 10 mg daily, fenofibrate 145 mg daily  Patient {Actions; denies-reports:120008} hypoglycemic events.  Patient reported dietary habits: Eats *** meals/day Breakfast:*** Lunch:*** Dinner:*** Snacks:*** Drinks:***  Patient-reported exercise habits: ***   Patient {Actions; denies-reports:120008} nocturia (nighttime urination).  Patient {Actions; denies-reports:120008} neuropathy (nerve pain). Patient {Actions; denies-reports:120008} visual changes. Patient {Actions; denies-reports:120008} self foot exams.     O:  POCT BG: ***  Lab Results  Component Value Date   HGBA1C 8.2 (A) 10/13/2021   There were no vitals filed for this visit.  Lipid Panel     Component Value Date/Time   CHOL 264 (H) 02/09/2021 1022   TRIG 870 (HH) 02/09/2021 1022   HDL 25 (L) 02/09/2021 1022   CHOLHDL 10.6 (H) 02/09/2021 1022   LDLCALC Comment (A) 02/09/2021 1022    Home fasting blood sugars: ***  2 hour post-meal/random blood sugars: ***.  Clinical Atherosclerotic Cardiovascular Disease (ASCVD): No  The 10-year ASCVD risk score (Arnett DK, et al., 2019) is: 49.7%   Values used to calculate the score:     Age: 61 years     Sex: Male      Is Non-Hispanic African American: No     Diabetic: Yes     Tobacco smoker: Yes     Systolic Blood Pressure: 726 mmHg     Is BP treated: Yes     HDL Cholesterol: 25 mg/dL     Total Cholesterol: 264 mg/dL   A/P: Diabetes longstanding currently uncontrolled with most recent A1c 8.2 (up from 7.3). Patient is able to verbalize appropriate hypoglycemia management plan. Medication adherence appears ***. Control is suboptimal due to ***. -*** -Extensively discussed pathophysiology of diabetes, recommended lifestyle interventions, dietary effects on blood sugar control -Counseled on s/sx of and management of hypoglycemia -Next A1C anticipated March 2023.   ASCVD risk - primary***secondary prevention in patient with diabetes. Last LDL {Is/is not:9024} controlled. ASCVD risk score {Is/is not:9024} >20%  - {Desc; low/moderate/high:110033} intensity statin indicated. Aspirin {Is/is not:9024} indicated.  -{Meds adjust:18428} aspirin *** mg  -{Meds adjust:18428} ***statin *** mg.   Hypertension longstanding*** currently ***.  Blood pressure goal = *** mmHg. Medication adherence ***.  Blood pressure control is suboptimal due to ***. -***  Total time in face to face counseling *** minutes. Follow up PCP Clinic Visit in 1 month.

## 2021-11-14 ENCOUNTER — Ambulatory Visit: Payer: Self-pay | Admitting: Pharmacist

## 2021-11-29 ENCOUNTER — Other Ambulatory Visit: Payer: Self-pay

## 2021-11-29 ENCOUNTER — Ambulatory Visit: Payer: Self-pay | Attending: Critical Care Medicine | Admitting: Pharmacist

## 2021-11-29 ENCOUNTER — Encounter: Payer: Self-pay | Admitting: Pharmacist

## 2021-11-29 DIAGNOSIS — E1165 Type 2 diabetes mellitus with hyperglycemia: Secondary | ICD-10-CM

## 2021-11-29 MED ORDER — TRULICITY 1.5 MG/0.5ML ~~LOC~~ SOAJ
1.5000 mg | SUBCUTANEOUS | 3 refills | Status: DC
  Filled 2021-11-29: qty 2, 28d supply, fill #0

## 2021-11-29 NOTE — Patient Instructions (Addendum)
Gracias por venir a verme hoy. Por favor haz lo siguiente:  1. Contine con Trulicity. Tome una inyeccin una vez a la semana. 2. Continuar con metformina. Tome una tableta por la maana y 1 tableta por la noche. 3. Contine con todos los dems medicamentos. Envi recargas a nuestra farmacia de abajo. 4. Contine controlando los niveles de Dispensing optician en casa. Envi recargas para tiras y agujas. 5. Contine haciendo los cambios de estilo de vida que hemos discutido juntos durante nuestra visita. La dieta y el ejercicio juegan un papel importante en la mejora de los niveles de azcar en la [place]. 6. Seguimiento conmigo en 1 mes.     English version:  Thank you for coming to see me today. Please do the following:  Continue Trulicity. Take one injection once a week.  Continue metformin. Take one tablet in the morning and 1 tablet in the evening.  Continue all other medications. I sent refills to our pharmacy downstairs.  Continue checking blood sugars at home. I sent refills for strips and needles.  Continue making the lifestyle changes we've discussed together during our visit. Diet and exercise play a significant role in improving your blood sugars.  Follow-up with me in 1 month.

## 2021-11-29 NOTE — Progress Notes (Signed)
° ° °  S:    PCP: Dr. Joya Gaskins   No chief complaint on file.  Patient arrives in good spirits. Presents for diabetes evaluation, education, and management Patient was referred and last seen by Primary Care Provider on 10/13/2021. A1c resulted at 8.2%. Trulicity was started at that visit. Today, pt denies any issues with the Trulicity. Denies NV, abdominal pain. No changes in vision. He tells me he has completed four injections and thinks this is working well for him.  Family/Social History:  -Fhx: stroke, DM -Tobacco: former smoker  -Alcohol: denies current use  Insurance coverage/medication affordability: self pay  Medication adherence reported.   Current diabetes medications include: metformin 9604 mg BID, Trulicity 5.40 mg weekly  Current hypertension medications include: amlodipine 10 mg daily, valsartan 60 mg daily  Current hyperlipidemia medications include: atorvastatin 10 mg daily, fenofibrate 145 mg daily   Patient denies hypoglycemic events.  Patient reported dietary habits: - Reports that his diet is "better". Does not go into much detail but tells me he is limiting his carbs and does not eat any sweets.  -Denies drinking any fruit juices, soda, or other sugar-sweetened beverages   Patient-reported exercise habits:  - Tells me he exercises ~20 minutes every day.    Patient denies nocturia (nighttime urination).  Patient denies neuropathy (nerve pain). Patient denies visual changes. Patient reports self foot exams.     O:  Lab Results  Component Value Date   HGBA1C 8.2 (A) 10/13/2021   There were no vitals filed for this visit.  Lipid Panel     Component Value Date/Time   CHOL 264 (H) 02/09/2021 1022   TRIG 870 (HH) 02/09/2021 1022   HDL 25 (L) 02/09/2021 1022   CHOLHDL 10.6 (H) 02/09/2021 1022   LDLCALC Comment (A) 02/09/2021 1022    Home fasting blood sugars: 130s-140s. Self-reported. No meter with him today.    Clinical Atherosclerotic Cardiovascular  Disease (ASCVD): No  The 10-year ASCVD risk score (Arnett DK, et al., 2019) is: 49.7%   Values used to calculate the score:     Age: 61 years     Sex: Male     Is Non-Hispanic African American: No     Diabetic: Yes     Tobacco smoker: Yes     Systolic Blood Pressure: 981 mmHg     Is BP treated: Yes     HDL Cholesterol: 25 mg/dL     Total Cholesterol: 264 mg/dL   A/P: Diabetes longstanding currently uncontrolled based on A1c, however, home glycemic control is near goal. Patient is able to verbalize appropriate hypoglycemia management plan. Medication adherence appears appropriate. -Increased dose of Trulicity to 1.5 mg weekly.  -Continued metformin 1000 mg BID.  -Extensively discussed pathophysiology of diabetes, recommended lifestyle interventions, dietary effects on blood sugar control -Counseled on s/sx of and management of hypoglycemia -Next A1C anticipated 12/2021.   Written patient instructions provided.  Total time in face to face counseling 20 minutes.   Follow up Pharmacist Clinic Visit in 1 month.  Benard Halsted, PharmD, [facility], [place] [phone]

## 2021-12-14 ENCOUNTER — Ambulatory Visit: Payer: Self-pay | Attending: Critical Care Medicine | Admitting: Critical Care Medicine

## 2021-12-14 ENCOUNTER — Other Ambulatory Visit: Payer: Self-pay

## 2021-12-14 ENCOUNTER — Encounter: Payer: Self-pay | Admitting: Critical Care Medicine

## 2021-12-14 VITALS — BP 132/81 | HR 60 | Resp 16 | Wt 161.2 lb

## 2021-12-14 DIAGNOSIS — M1711 Unilateral primary osteoarthritis, right knee: Secondary | ICD-10-CM

## 2021-12-14 DIAGNOSIS — E785 Hyperlipidemia, unspecified: Secondary | ICD-10-CM

## 2021-12-14 DIAGNOSIS — I10 Essential (primary) hypertension: Secondary | ICD-10-CM

## 2021-12-14 DIAGNOSIS — E119 Type 2 diabetes mellitus without complications: Secondary | ICD-10-CM

## 2021-12-14 DIAGNOSIS — J8410 Pulmonary fibrosis, unspecified: Secondary | ICD-10-CM

## 2021-12-14 DIAGNOSIS — E1165 Type 2 diabetes mellitus with hyperglycemia: Secondary | ICD-10-CM

## 2021-12-14 DIAGNOSIS — E781 Pure hyperglyceridemia: Secondary | ICD-10-CM

## 2021-12-14 LAB — GLUCOSE, POCT (MANUAL RESULT ENTRY): POC Glucose: 119 mg/dl — AB (ref 70–99)

## 2021-12-14 MED ORDER — ATORVASTATIN CALCIUM 10 MG PO TABS
ORAL_TABLET | Freq: Every day | ORAL | 3 refills | Status: DC
  Filled 2021-12-14: qty 90, fill #0

## 2021-12-14 MED ORDER — VALSARTAN 160 MG PO TABS
ORAL_TABLET | Freq: Every day | ORAL | 1 refills | Status: DC
  Filled 2021-12-14: qty 90, fill #0

## 2021-12-14 MED ORDER — FENOFIBRATE 145 MG PO TABS
145.0000 mg | ORAL_TABLET | Freq: Every day | ORAL | 1 refills | Status: DC
  Filled 2021-12-14: qty 60, 60d supply, fill #0

## 2021-12-14 MED ORDER — AMLODIPINE BESYLATE 10 MG PO TABS
ORAL_TABLET | Freq: Every day | ORAL | 1 refills | Status: DC
Start: 2021-12-14 — End: 2022-04-13
  Filled 2021-12-14: qty 90, fill #0

## 2021-12-14 NOTE — Assessment & Plan Note (Signed)
Currently on Trulicity and metformin his blood sugar shows improved control continue same

## 2021-12-14 NOTE — Assessment & Plan Note (Signed)
Patient with persistent left knee pain will refer to orthopedics

## 2021-12-14 NOTE — Assessment & Plan Note (Signed)
Hypertension well controlled continue current medications refills given

## 2021-12-14 NOTE — Progress Notes (Signed)
Established Patient Office Visit  Subjective:  Patient ID: Walter Phillips, male    DOB: [birth year]  Age: 61 y.o. MRN: [id number]  CC:  Chief Complaint  Patient presents with   Diabetes    HPI Walter Phillips presents for 42-monthdiabetes follow-up blood sugars have been 116 and 96 he does have hypertriglyceridemia this needs to be repeated checked.  On arrival blood pressure 132/81.  This visit was assisted by Spanish interpreter JCory Roughen#[phone] Patient has no other complaints does need refills on medication  He is due an eye exam he states he will get this accomplished  Patient still has left knee pain he had this injected previously will refer to orthopedic surgery   Past Medical History:  Diagnosis Date   [place]   Diabetes [place], [place] II [place]    Dyslipidemia     Past Surgical History:  Procedure Laterality Date   APPENDECTOMY     LAPAROSCOPIC CHOLECYSTECTOMY     LAPAROSCOPIC SIGMOID COLECTOMY     VENTRAL HERNIA REPAIR  05/27/2014    Family History  Problem Relation Age of Onset   Early death Mother        childbirth   Cancer Father    Stomach cancer Sister 712  Stroke Sister    Diabetes Daughter     Social History   Socioeconomic History   Marital status: Married    Spouse name: Not on file   Number of children: Not on file   Years of education: Not on file   Highest education level: Not on file  Occupational History   Not on file  Tobacco Use   Smoking status: Former    Packs/day: 0.50    Types: Cigarettes   Smokeless tobacco: Never  Vaping Use   Vaping Use: Never used  Substance and Sexual Activity   Alcohol use: Yes    Comment: occ   Drug use: No   Sexual activity: Not on file  Other Topics Concern   Not on file  Social History Narrative   Not on file   Social Determinants of Health   Financial Resource Strain: Not on file  Food Insecurity: Not on file  Transportation Needs: Not on  file  Physical Activity: Not on file  Stress: Not on file  Social Connections: Not on file  Intimate Partner Violence: Not on file    Outpatient Medications Prior to Visit  Medication Sig Dispense Refill   albuterol (VENTOLIN HFA) 108 (90 Base) MCG/ACT inhaler Inhale 1-2 puffs into the lungs every 4 (four) hours as needed for wheezing or shortness of breath. 8 g 1   Blood Glucose Monitoring Suppl (TRUE METRIX METER) w/Device KIT Use to measure blood sugar twice a day 1 kit 0   clobetasol cream (TEMOVATE) 0.05 % Apply topically 2 (two) times daily. 30 g 1   Dulaglutide (TRULICITY) 1.5 MYC/1.4GYSOPN Inject 1.5 mg into the skin once a week. 2 mL 3   glucose blood (TRUE METRIX BLOOD GLUCOSE TEST) test strip Use as instructed 100 each 12   metFORMIN (GLUCOPHAGE) 1000 MG tablet Take 1 tablet (1,000 mg total) by mouth daily with breakfast. 90 tablet 3   olopatadine (PATANOL) 0.1 % ophthalmic solution Place 1 drop into the left eye 2 (two) times daily. 5 mL 0   omeprazole (PRILOSEC) 40 MG capsule TAKE 1 CAPSULE (40 MG TOTAL) BY MOUTH DAILY. 30 capsule 3   tadalafil (CIALIS) 10 MG tablet  Take 1 tablet (10 mg total) by mouth every other day as needed for erectile dysfunction. 10 tablet 1   TRUEplus Lancets 28G MISC Use to measure blood sugar twice a day 100 each 1   amLODipine (NORVASC) 10 MG tablet TAKE 1 TABLET (10 MG TOTAL) BY MOUTH DAILY. 90 tablet 1   atorvastatin (LIPITOR) 10 MG tablet TAKE 1 TABLET (10 MG TOTAL) BY MOUTH DAILY. 90 tablet 3   fenofibrate (TRICOR) 145 MG tablet Take 1 tablet (145 mg total) by mouth daily. 60 tablet 1   valsartan (DIOVAN) 160 MG tablet TAKE 1 TABLET (160 MG TOTAL) BY MOUTH DAILY. 90 tablet 1   No facility-administered medications prior to visit.    No Known Allergies  ROS Review of Systems  Constitutional: Negative.   HENT: Negative.  Negative for ear pain, postnasal drip, rhinorrhea, sinus pressure, sore throat, trouble swallowing and voice change.    Eyes: Negative.   Respiratory: Negative.  Negative for apnea, cough, choking, chest tightness, shortness of breath, wheezing and stridor.   Cardiovascular: Negative.  Negative for chest pain, palpitations and leg swelling.  Gastrointestinal: Negative.  Negative for abdominal distention, abdominal pain, nausea and vomiting.  Genitourinary: Negative.   Musculoskeletal: Negative.  Negative for arthralgias and myalgias.  Skin: Negative.  Negative for rash.  Allergic/Immunologic: Negative.  Negative for environmental allergies and food allergies.  Neurological: Negative.  Negative for dizziness, syncope, weakness and headaches.  Hematological: Negative.  Negative for adenopathy. Does not bruise/bleed easily.  Psychiatric/Behavioral: Negative.  Negative for agitation and sleep disturbance. The patient is not nervous/anxious.      Objective:    Physical Exam Vitals reviewed.  Constitutional:      Appearance: Normal appearance. He is well-developed. He is not diaphoretic.  HENT:     Head: Normocephalic and atraumatic.     Nose: No nasal deformity, septal deviation, mucosal edema or rhinorrhea.     Right Sinus: No maxillary sinus tenderness or frontal sinus tenderness.     Left Sinus: No maxillary sinus tenderness or frontal sinus tenderness.     Mouth/Throat:     Pharynx: No oropharyngeal exudate.  Eyes:     General: No scleral icterus.    Conjunctiva/sclera: Conjunctivae normal.     Pupils: Pupils are equal, round, and reactive to light.  Neck:     Thyroid: No thyromegaly.     Vascular: No carotid bruit or JVD.     Trachea: Trachea normal. No tracheal tenderness or tracheal deviation.  Cardiovascular:     Rate and Rhythm: Normal rate and regular rhythm.     Chest Wall: PMI is not displaced.     Pulses: Normal pulses. No decreased pulses.     Heart sounds: Normal heart sounds, S1 normal and S2 normal. Heart sounds not distant. No murmur heard. No systolic murmur is present.  No  diastolic murmur is present.    No friction rub. No gallop. No S3 or S4 sounds.  Pulmonary:     Effort: No tachypnea, accessory muscle usage or respiratory distress.     Breath sounds: No stridor. No decreased breath sounds, wheezing, rhonchi or rales.  Chest:     Chest wall: No tenderness.  Abdominal:     General: Bowel sounds are normal. There is no distension.     Palpations: Abdomen is soft. Abdomen is not rigid.     Tenderness: There is no abdominal tenderness. There is no guarding or rebound.  Musculoskeletal:  General: Normal range of motion.     Cervical back: Normal range of motion and neck supple. No edema, erythema or rigidity. No muscular tenderness. Normal range of motion.  Lymphadenopathy:     Head:     Right side of head: No submental or submandibular adenopathy.     Left side of head: No submental or submandibular adenopathy.     Cervical: No cervical adenopathy.  Skin:    General: Skin is warm and dry.     Coloration: Skin is not pale.     Findings: No rash.     Nails: There is no clubbing.  Neurological:     Mental Status: He is alert and oriented to person, place, and time.     Sensory: No sensory deficit.  Psychiatric:        Speech: Speech normal.        Behavior: Behavior normal.    BP 132/81    Pulse 60    Resp 16    Wt 161 lb 3.7 oz (73.1 kg)    SpO2 98%    BMI 29.49 kg/m  Wt Readings from Last 3 Encounters:  12/14/21 161 lb 3.7 oz (73.1 kg)  10/13/21 164 lb 3.2 oz (74.5 kg)  06/13/21 167 lb (75.8 kg)     Health Maintenance Due  Topic Date Due   OPHTHALMOLOGY EXAM  Never done    There are no preventive care reminders to display for this patient.  No results found for: TSH Lab Results  Component Value Date   WBC 5.1 05/19/2020   HGB 15.6 05/19/2020   HCT 47.1 05/19/2020   MCV 90 05/19/2020   PLT 199 05/19/2020   Lab Results  Component Value Date   NA 140 11/10/2019   K 3.9 11/10/2019   CO2 26 11/10/2019   GLUCOSE 110 (H)  11/10/2019   BUN 10 11/10/2019   CREATININE 0.47 (L) 11/10/2019   BILITOT 0.9 02/09/2021   ALKPHOS 147 (H) 02/09/2021   AST 25 02/09/2021   ALT 32 02/09/2021   PROT 7.6 02/09/2021   ALBUMIN 4.6 02/09/2021   CALCIUM 9.4 11/10/2019   ANIONGAP 10 11/07/2019   Lab Results  Component Value Date   CHOL 264 (H) 02/09/2021   Lab Results  Component Value Date   HDL 25 (L) 02/09/2021   Lab Results  Component Value Date   LDLCALC Comment (A) 02/09/2021   Lab Results  Component Value Date   TRIG 870 ([place]) 02/09/2021   Lab Results  Component Value Date   CHOLHDL 10.6 (H) 02/09/2021   Lab Results  Component Value Date   HGBA1C 8.2 (A) 10/13/2021      Assessment & Plan:   Problem List Items Addressed This Visit       Cardiovascular and Mediastinum   HTN (hypertension)    Hypertension well controlled continue current medications refills given      Relevant Medications   amLODipine (NORVASC) 10 MG tablet   atorvastatin (LIPITOR) 10 MG tablet   fenofibrate (TRICOR) 145 MG tablet   valsartan (DIOVAN) 160 MG tablet     Respiratory   Postinflammatory pulmonary fibrosis ([place])    This is slowly resolving he is off oxygen uses albuterol as needed        Endocrine   Controlled type 2 diabetes mellitus ([place]) - Primary    Currently on Trulicity and metformin his blood sugar shows improved control continue same      Relevant Medications   atorvastatin (  LIPITOR) 10 MG tablet   valsartan (DIOVAN) 160 MG tablet     Musculoskeletal and Integument   OA (osteoarthritis) of knee    Patient with persistent left knee pain will refer to orthopedics      Relevant Orders   Ambulatory referral to Orthopedic Surgery     Other   Dyslipidemia    Continue current statin therapy recheck lipids      Relevant Medications   atorvastatin (LIPITOR) 10 MG tablet   fenofibrate (TRICOR) 145 MG tablet   Other Relevant Orders   Lipid panel   Hypertriglyceridemia    Elevated  triglyceride recheck levels      Relevant Medications   amLODipine (NORVASC) 10 MG tablet   atorvastatin (LIPITOR) 10 MG tablet   fenofibrate (TRICOR) 145 MG tablet   valsartan (DIOVAN) 160 MG tablet   Other Relevant Orders   Lipid panel    Meds ordered this encounter  Medications   amLODipine (NORVASC) 10 MG tablet    Sig: TAKE 1 TABLET (10 MG TOTAL) BY MOUTH DAILY.    Dispense:  90 tablet    Refill:  1   atorvastatin (LIPITOR) 10 MG tablet    Sig: TAKE 1 TABLET (10 MG TOTAL) BY MOUTH DAILY.    Dispense:  90 tablet    Refill:  3   fenofibrate (TRICOR) 145 MG tablet    Sig: Take 1 tablet (145 mg total) by mouth daily.    Dispense:  60 tablet    Refill:  1   valsartan (DIOVAN) 160 MG tablet    Sig: TAKE 1 TABLET (160 MG TOTAL) BY MOUTH DAILY.    Dispense:  90 tablet    Refill:  1    Follow-up: No follow-ups on file.    Asencion Noble, MD

## 2021-12-14 NOTE — Patient Instructions (Addendum)
All medications refilled  Get your eyes examined  Repeat cholesterol level obtained today  No change in medication dosing  Return to [facility] 4 months  Dr Erlinda Hong of orthopedics will call you to recheck your knee,  do knee exercises attached  Todos los medicamentos reabastecidos  Haz que te examinen los ojos  Repita el nivel de colesterol obtenido hoy  Sin cambios en la dosificacin de medicamentos  Regreso a DR Joya Gaskins 4 meses  El Dr. Erlinda Hong de ortopedia lo llamar para volver a revisar su rodilla, hacer ejercicios de rodilla adjuntos

## 2021-12-14 NOTE — Assessment & Plan Note (Signed)
This is slowly resolving he is off oxygen uses albuterol as needed

## 2021-12-14 NOTE — Assessment & Plan Note (Signed)
Elevated triglyceride recheck levels

## 2021-12-14 NOTE — Assessment & Plan Note (Signed)
Continue current statin therapy recheck lipids

## 2021-12-15 ENCOUNTER — Telehealth: Payer: Self-pay

## 2021-12-15 LAB — LIPID PANEL
Chol/HDL Ratio: 6.3 ratio — ABNORMAL HIGH (ref 0.0–5.0)
Cholesterol, Total: 169 mg/dL (ref 100–199)
HDL: 27 mg/dL — ABNORMAL LOW (ref >39)
LDL Chol Calc (NIH): 66 mg/dL (ref 0–99)
Triglycerides: 495 mg/dL — ABNORMAL HIGH (ref 0–149)
VLDL Cholesterol Cal: 76 mg/dL — ABNORMAL HIGH (ref 5–40)

## 2021-12-15 NOTE — Telephone Encounter (Signed)
-----   Message from Elsie Stain, MD sent at 12/15/2021  5:52 AM EST ----- Let pt know cholesterol is at goal and triglycerides are better.  Focus on a low fat diet and stay on choleseterol pills

## 2021-12-15 NOTE — Telephone Encounter (Signed)
Pt was called and is aware of results, DOB was confirmed.   Interpreter OP#[id number]

## 2021-12-20 ENCOUNTER — Other Ambulatory Visit: Payer: Self-pay

## 2021-12-20 ENCOUNTER — Encounter: Payer: Self-pay | Admitting: Orthopaedic Surgery

## 2021-12-20 ENCOUNTER — Ambulatory Visit (INDEPENDENT_AMBULATORY_CARE_PROVIDER_SITE_OTHER): Payer: Self-pay | Admitting: Orthopaedic Surgery

## 2021-12-20 DIAGNOSIS — M25561 Pain in right knee: Secondary | ICD-10-CM

## 2021-12-20 NOTE — Progress Notes (Signed)
Office Visit Note   Patient: Walter Phillips           Date of Birth: 07-22-1961           MRN: [id number] Visit Date: 12/20/2021              Requested by: Elsie Stain, MD [address],  [place] [address] PCP: Elsie Stain, MD   Assessment & Plan: Visit Diagnoses:  1. Right knee pain, unspecified chronicity     Plan: Impression is chronic right knee pain with questionable underlying degenerative medial meniscus tear and chondrocalcinosis.  Because the patient had significant relief which lasted approximately 6 months, I discussed repeat cortisone injection today.  He tolerated this well.  If his symptoms return sooner we will likely get an MRI to assess for meniscal pathology.  Otherwise follow-up with [place] as needed.  Follow-Up Instructions: Return if symptoms worsen or fail to improve.   Orders:  No orders of the defined types were placed in this encounter.  No orders of the defined types were placed in this encounter.     Procedures: Large Joint Inj: R knee on 12/20/2021 8:45 AM Indications: pain Details: 22 G needle, anterolateral approach Medications: 2 mL lidocaine 1 %; 2 mL bupivacaine 0.25 %; 40 mg methylPREDNISolone acetate 40 MG/ML     Clinical Data: No additional findings.   Subjective: Chief Complaint  Patient presents with   Right Knee - Pain    HPI patient is a pleasant 61 year old gentleman who comes in today with recurrent right knee pain.  He is here with a [place] interpreter.  He was seen in our office back in August with 2 months right knee pain.  Previous x-rays demonstrated chondrocalcinosis medial compartment.  Intra-articular cortisone injection was performed at that time which provided significant relief until a few days ago.  His pain has returned but is intermittent in nature.  He notes slight stiffness with flexion of the knee due to swelling in the popliteal fossa.  He also notes occasional pain to the medial knee.  He  denies any mechanical symptoms.  No instability.  Review of Systems as detailed in HPI.  All others reviewed and are negative.   Objective: Vital Signs: There were no vitals taken for this visit.  Physical Exam well-developed well-nourished gentleman in no acute distress.  Alert and oriented x3.  Ortho Exam right knee exam shows no effusion.  Range of motion 0 to 130 degrees.  Medial joint line tenderness.  No patellofemoral crepitus.  Ligaments are stable.  He is neurovascular intact distally.  Specialty Comments:  No specialty comments available.  Imaging: No new imaging   PMFS History: Patient Active Problem List   Diagnosis Date Noted   OA (osteoarthritis) of knee 06/13/2021   GERD (gastroesophageal reflux disease) 06/13/2021   Hypertriglyceridemia 02/10/2021   Controlled type 2 diabetes mellitus ([place]) 02/09/2021   Psoriasis 11/11/2020   HTN (hypertension) 06/22/2020   Erectile dysfunction and diabetes 06/22/2020   Postinflammatory pulmonary fibrosis ([place]) 12/02/2019   History of COVID-19 10/15/2019   Dyslipidemia 10/15/2019   Ventral hernia 04/17/2014   Past Medical History:  Diagnosis Date   Chronic respiratory failure with hypoxia ([place]) 10/15/2019   Diabetes mellitus, type II ([place])    Dyslipidemia     Family History  Problem Relation Age of Onset   Early death Mother        childbirth   Cancer Father    Stomach cancer Sister  24   Stroke Sister    Diabetes Daughter     Past Surgical History:  Procedure Laterality Date   APPENDECTOMY     LAPAROSCOPIC CHOLECYSTECTOMY     LAPAROSCOPIC SIGMOID COLECTOMY     VENTRAL HERNIA REPAIR  05/27/2014   Social History   Occupational History   Not on file  Tobacco Use   Smoking status: Former    Packs/day: 0.50    Types: Cigarettes   Smokeless tobacco: Never  Vaping Use   Vaping Use: Never used  Substance and Sexual Activity   Alcohol use: Yes    Comment: occ   Drug use: No   Sexual activity: Not on file

## 2021-12-21 MED ORDER — METHYLPREDNISOLONE ACETATE 40 MG/ML IJ SUSP
40.0000 mg | INTRAMUSCULAR | Status: AC | PRN
  Administered 2021-12-20: 40 mg via INTRA_ARTICULAR

## 2021-12-21 MED ORDER — LIDOCAINE HCL 1 % IJ SOLN
2.0000 mL | INTRAMUSCULAR | Status: AC | PRN
  Administered 2021-12-20: 2 mL

## 2021-12-21 MED ORDER — BUPIVACAINE HCL 0.25 % IJ SOLN
2.0000 mL | INTRAMUSCULAR | Status: AC | PRN
  Administered 2021-12-20: 2 mL via INTRA_ARTICULAR

## 2021-12-27 ENCOUNTER — Other Ambulatory Visit: Payer: Self-pay

## 2021-12-27 ENCOUNTER — Ambulatory Visit: Payer: Self-pay | Attending: Critical Care Medicine | Admitting: Pharmacist

## 2021-12-27 ENCOUNTER — Encounter: Payer: Self-pay | Admitting: Pharmacist

## 2021-12-27 DIAGNOSIS — E119 Type 2 diabetes mellitus without complications: Secondary | ICD-10-CM

## 2021-12-27 LAB — POCT GLYCOSYLATED HEMOGLOBIN (HGB A1C): HbA1c, POC (controlled diabetic range): 6.7 % (ref 0.0–7.0)

## 2021-12-27 MED ORDER — CYCLOBENZAPRINE HCL 10 MG PO TABS
10.0000 mg | ORAL_TABLET | Freq: Three times a day (TID) | ORAL | 1 refills | Status: DC
  Filled 2021-12-27: qty 60, 20d supply, fill #0

## 2021-12-27 NOTE — Progress Notes (Signed)
° ° °  S:    PCP: Dr. Joya Gaskins   No chief complaint on file.  Patient arrives in good spirits. Presents for diabetes evaluation, education, and management Patient was referred and last seen by Primary Care Provider on 10/13/2021. I saw him on 11/29/2021 and increased Trulicity. Today, he denies any abdominal pain, NV. Denies any changes in vision. He is endorsing lower back pain and bilateral hip pain. He sees an orthopedist but requests a refill for a muscle relaxer. No other complaints today.   Family/Social History:  -Fhx: stroke, DM -Tobacco: former smoker  -Alcohol: denies current use  Insurance coverage/medication affordability: self pay  Medication adherence reported.   Current diabetes medications include: metformin 7858 mg BID, Trulicity 1.5 mg weekly  Current hypertension medications include: amlodipine 10 mg daily, valsartan 60 mg daily  Current hyperlipidemia medications include: atorvastatin 10 mg daily, fenofibrate 145 mg daily   Patient denies hypoglycemic events.  Patient reported dietary habits: - Reports that his diet is "better". Does not go into much detail but tells me he is limiting his carbs and does not eat any sweets.  -Denies drinking any fruit juices, soda, or other sugar-sweetened beverages   Patient-reported exercise habits:  - Tells me he exercises ~20 minutes every day.    Patient denies nocturia (nighttime urination).  Patient denies neuropathy (nerve pain). Patient denies visual changes. Patient reports self foot exams.     O:  Lab Results  Component Value Date   HGBA1C 6.7 12/27/2021   There were no vitals filed for this visit.  Lipid Panel     Component Value Date/Time   CHOL 169 12/14/2021 1015   TRIG 495 (H) 12/14/2021 1015   HDL 27 (L) 12/14/2021 1015   CHOLHDL 6.3 (H) 12/14/2021 1015   LDLCALC 66 12/14/2021 1015   Home fasting blood sugars: reports sugar levels in the 80s - 130s. The highest he has seen since last appt was  160.  Clinical Atherosclerotic Cardiovascular Disease (ASCVD): No  The 10-year ASCVD risk score (Arnett DK, et al., 2019) is: 40.5%   Values used to calculate the score:     Age: 61 years     Sex: Male     Is Non-Hispanic African American: No     Diabetic: Yes     Tobacco smoker: Yes     Systolic Blood Pressure: 850 mmHg     Is BP treated: Yes     HDL Cholesterol: 27 mg/dL     Total Cholesterol: 169 mg/dL   A/P: Diabetes longstanding currently controlled based on A1c today. Patient is able to verbalize appropriate hypoglycemia management plan. Medication adherence appears appropriate. Commended pt on his control! Will check renal function today. Advised pt to contact his orthopedic specialist in regards to his hip pain. I was able to refill a cyclobenzaprine that was on his medication profile.  -Continue Trulicity to 1.5 mg weekly.  -Continued metformin 1000 mg BID.  -Extensively discussed pathophysiology of diabetes, recommended lifestyle interventions, dietary effects on blood sugar control -Counseled on s/sx of and management of hypoglycemia -Next A1C anticipated 03/2022 with PCP.   Written patient instructions provided.  Total time in face to face counseling 20 minutes.   Follow up PCP Clinic Visit in June. Told him to call in and request a visit if needed.   Benard Halsted, PharmD, [place], [place] [phone]

## 2021-12-28 LAB — CMP14+EGFR
ALT: 24 IU/L (ref 0–44)
AST: 20 IU/L (ref 0–40)
Albumin/Globulin Ratio: 1.8 (ref 1.2–2.2)
Albumin: 4.7 g/dL (ref 3.8–4.8)
Alkaline Phosphatase: 143 IU/L — ABNORMAL HIGH (ref 44–121)
BUN/Creatinine Ratio: 37 — ABNORMAL HIGH (ref 10–24)
BUN: 23 mg/dL (ref 8–27)
Bilirubin Total: 0.9 mg/dL (ref 0.0–1.2)
CO2: 25 mmol/L (ref 20–29)
Calcium: 9.5 mg/dL (ref 8.6–10.2)
Chloride: 104 mmol/L (ref 96–106)
Creatinine, Ser: 0.62 mg/dL — ABNORMAL LOW (ref 0.76–1.27)
Globulin, Total: 2.6 g/dL (ref 1.5–4.5)
Glucose: 117 mg/dL — ABNORMAL HIGH (ref 70–99)
Potassium: 4.2 mmol/L (ref 3.5–5.2)
Sodium: 142 mmol/L (ref 134–144)
Total Protein: 7.3 g/dL (ref 6.0–8.5)
eGFR: 108 mL/min/{1.73_m2} (ref >59)

## 2022-04-12 LAB — HM DIABETES EYE EXAM

## 2022-04-13 ENCOUNTER — Encounter: Payer: Self-pay | Admitting: Critical Care Medicine

## 2022-04-13 ENCOUNTER — Ambulatory Visit: Payer: Self-pay | Attending: Critical Care Medicine | Admitting: Critical Care Medicine

## 2022-04-13 ENCOUNTER — Other Ambulatory Visit: Payer: Self-pay

## 2022-04-13 VITALS — BP 143/80 | HR 63 | Temp 98.3°F | Wt 163.0 lb

## 2022-04-13 DIAGNOSIS — I10 Essential (primary) hypertension: Secondary | ICD-10-CM

## 2022-04-13 DIAGNOSIS — J8410 Pulmonary fibrosis, unspecified: Secondary | ICD-10-CM

## 2022-04-13 DIAGNOSIS — E119 Type 2 diabetes mellitus without complications: Secondary | ICD-10-CM

## 2022-04-13 DIAGNOSIS — H269 Unspecified cataract: Secondary | ICD-10-CM

## 2022-04-13 DIAGNOSIS — E785 Hyperlipidemia, unspecified: Secondary | ICD-10-CM

## 2022-04-13 DIAGNOSIS — Z1211 Encounter for screening for malignant neoplasm of colon: Secondary | ICD-10-CM

## 2022-04-13 DIAGNOSIS — H35033 Hypertensive retinopathy, bilateral: Secondary | ICD-10-CM

## 2022-04-13 LAB — POCT CBG (FASTING - GLUCOSE)-MANUAL ENTRY: Glucose Fasting, POC: 170 mg/dL — AB (ref 70–99)

## 2022-04-13 MED ORDER — ATORVASTATIN CALCIUM 10 MG PO TABS
ORAL_TABLET | Freq: Every day | ORAL | 3 refills | Status: DC
  Filled 2022-04-13: qty 30, 30d supply, fill #0

## 2022-04-13 MED ORDER — TRUEPLUS LANCETS 28G MISC
1 refills | Status: DC
  Filled 2022-04-13 – 2022-08-31 (×3): qty 100, 50d supply, fill #0

## 2022-04-13 MED ORDER — CLOBETASOL PROPIONATE 0.05 % EX CREA
TOPICAL_CREAM | Freq: Two times a day (BID) | CUTANEOUS | 1 refills | Status: DC
  Filled 2022-04-13: qty 30, 15d supply, fill #0

## 2022-04-13 MED ORDER — METFORMIN HCL 1000 MG PO TABS
1000.0000 mg | ORAL_TABLET | Freq: Every day | ORAL | 3 refills | Status: DC
Start: 2022-04-13 — End: 2022-08-23
  Filled 2022-04-13: qty 30, 30d supply, fill #0

## 2022-04-13 MED ORDER — FENOFIBRATE 145 MG PO TABS
145.0000 mg | ORAL_TABLET | Freq: Every day | ORAL | 1 refills | Status: DC
  Filled 2022-04-13: qty 30, 30d supply, fill #0

## 2022-04-13 MED ORDER — OMEPRAZOLE 40 MG PO CPDR
DELAYED_RELEASE_CAPSULE | Freq: Every day | ORAL | 3 refills | Status: DC
  Filled 2022-04-13: qty 30, 30d supply, fill #0

## 2022-04-13 MED ORDER — TRUE METRIX BLOOD GLUCOSE TEST VI STRP
ORAL_STRIP | 12 refills | Status: DC
Start: 2022-04-13 — End: 2022-11-23
  Filled 2022-04-13 – 2022-08-31 (×3): qty 100, 50d supply, fill #0

## 2022-04-13 MED ORDER — CYCLOBENZAPRINE HCL 10 MG PO TABS
10.0000 mg | ORAL_TABLET | Freq: Three times a day (TID) | ORAL | 1 refills | Status: DC
  Filled 2022-04-13: qty 60, 20d supply, fill #0

## 2022-04-13 MED ORDER — AMLODIPINE BESYLATE 10 MG PO TABS
ORAL_TABLET | Freq: Every day | ORAL | 1 refills | Status: DC
  Filled 2022-04-13: qty 30, 30d supply, fill #0

## 2022-04-13 MED ORDER — VALSARTAN 160 MG PO TABS
ORAL_TABLET | Freq: Every day | ORAL | 1 refills | Status: DC
  Filled 2022-04-13: qty 30, 30d supply, fill #0

## 2022-04-13 MED ORDER — ALBUTEROL SULFATE HFA 108 (90 BASE) MCG/ACT IN AERS
1.0000 | INHALATION_SPRAY | RESPIRATORY_TRACT | 1 refills | Status: DC | PRN
  Filled 2022-04-13: qty 8.5, 17d supply, fill #0

## 2022-04-13 MED ORDER — TADALAFIL 10 MG PO TABS
10.0000 mg | ORAL_TABLET | ORAL | 1 refills | Status: DC | PRN
Start: 2022-04-13 — End: 2022-08-23
  Filled 2022-04-13: qty 10, 20d supply, fill #0

## 2022-04-13 MED ORDER — TRULICITY 1.5 MG/0.5ML ~~LOC~~ SOAJ
1.5000 mg | SUBCUTANEOUS | 3 refills | Status: DC
Start: 2022-04-13 — End: 2022-08-23
  Filled 2022-04-13 (×2): qty 2, 28d supply, fill #0

## 2022-04-13 NOTE — Assessment & Plan Note (Signed)
Patient at baseline we will renew albuterol

## 2022-04-13 NOTE — Assessment & Plan Note (Signed)
Continue statin therapy refill sent

## 2022-04-13 NOTE — Progress Notes (Signed)
Established Patient Office Visit  Subjective:  Patient ID: Walter Phillips, male    DOB: 1961-02-01  Age: 61 y.o. MRN: [id number]  CC:  Chief Complaint  Patient presents with   Medication Refill   Diabetes   Hypertension    HPI 11/2021 [name] presents for 31-monthdiabetes follow-up blood sugars have been 116 and 96 he does have hypertriglyceridemia this needs to be repeated checked.  On arrival blood pressure 132/81.  This visit was assisted by Spanish interpreter JCory [phone] Patient has no other complaints does need refills on medication  He is due an eye exam he states he will get this accomplished  Patient still has left knee pain he had this injected previously will refer to orthopedic surgery   6/15 this visit was assisted by video Spanish interpreter HMarina [phone] seen in return follow-up and he has had increasing blood sugars 150 160.  He has not been able to exercise as much and he ran out of his Trulicity and did not have it refilled after February.  Patient is also run out of his valsartan for 3 days.  On arrival blood pressure elevated 143/80  Patient is on the amlodipine 10 mg daily maintains the metformin  Patient's been eating more processed food as well.  He also eats a lot of plan [place]aine and pork and hashbrowns. Patient needs another colon cancer screening.  He did have a recent eye exam which showed mild retinopathy due to hypertension not due to diabetes, mild cataract dry eyes normal pressure repeat exam will be 1 year  From the standpoint of the patient's pulmonary fibrosis he is improved he says he is back to 85% of where he was before he was ill with COVID in 2020  Patient needs a urine microalbumin at this visit and a foot exam  Past Medical History:  Diagnosis Date   Chronic respiratory failure with hypoxia ([facility] 10/15/2019   Diabetes mellitus, type II ([facility] [place]    Dyslipidemia     Past Surgical History:  Procedure Laterality Date    APPENDECTOMY     LAPAROSCOPIC CHOLECYSTECTOMY     LAPAROSCOPIC SIGMOID COLECTOMY     VENTRAL HERNIA REPAIR  05/27/2014    Family History  Problem Relation Age of Onset   Early death Mother        childbirth   Cancer Father    Stomach cancer Sister [phone]  Stroke Sister    Diabetes Daughter     Social History   Socioeconomic History   Marital status: Married    Spouse name: Not on file   Number of children: Not on file   Years of education: Not on file   Highest education level: Not on file  Occupational History   Not on file  Tobacco Use   Smoking status: Former    Packs/day: 0.50    Types: Cigarettes   Smokeless tobacco: Never  Vaping Use   Vaping Use: Never used  Substance and Sexual Activity   Alcohol use: Yes    Comment: occ   Drug use: No   Sexual activity: Not on file  Other Topics Concern   Not on file  Social History Narrative   Not on file   Social Determinants of Health   Financial Resource Strain: Not on file  Food Insecurity: Not on file  Transportation Needs: Not on file  Physical Activity: Not on file  Stress: Not on file  Social Connections: Not on file  Intimate Partner Violence: Not on file    Outpatient Medications Prior to Visit  Medication Sig Dispense Refill   Blood Glucose Monitoring Suppl (TRUE METRIX METER) w/Device KIT Use to measure blood sugar twice a day 1 kit 0   olopatadine (PATANOL) 0.1 % ophthalmic solution Place 1 drop into the left eye 2 (two) times daily. 5 mL 0   albuterol (VENTOLIN HFA) 108 (90 Base) MCG/ACT inhaler Inhale 1-2 puffs into the lungs every 4 (four) hours as needed for wheezing or shortness of breath. 8 g 1   amLODipine (NORVASC) 10 MG tablet TAKE 1 TABLET (10 MG TOTAL) BY MOUTH DAILY. 90 tablet 1   atorvastatin (LIPITOR) 10 MG tablet TAKE 1 TABLET (10 MG TOTAL) BY MOUTH DAILY. 90 tablet 3   clobetasol cream (TEMOVATE) 0.05 % Apply topically 2 (two) times daily. 30 g 1   cyclobenzaprine (FLEXERIL) 10 MG  tablet Take 1 tablet (10 mg total) by mouth 3 (three) times daily. 60 tablet 1   Dulaglutide (TRULICITY) 1.5 KX/3.8HW SOPN Inject 1.5 mg into the skin once a week. 2 mL 3   fenofibrate (TRICOR) 145 MG tablet Take 1 tablet (145 mg total) by mouth daily. 60 tablet 1   glucose blood (TRUE METRIX BLOOD GLUCOSE TEST) test strip Use as instructed 100 each 12   metFORMIN (GLUCOPHAGE) 1000 MG tablet Take 1 tablet (1,000 mg total) by mouth daily with breakfast. 90 tablet 3   omeprazole (PRILOSEC) 40 MG capsule TAKE 1 CAPSULE (40 MG TOTAL) BY MOUTH DAILY. 30 capsule 3   tadalafil (CIALIS) 10 MG tablet Take 1 tablet (10 mg total) by mouth every other day as needed for erectile dysfunction. 10 tablet 1   TRUEplus Lancets 28G MISC Use to measure blood sugar twice a day 100 each 1   valsartan (DIOVAN) 160 MG tablet TAKE 1 TABLET (160 MG TOTAL) BY MOUTH DAILY. 90 tablet 1   No facility-administered medications prior to visit.    No Known Allergies  ROS Review of Systems  Constitutional: Negative.   HENT: Negative.  Negative for ear pain, postnasal drip, rhinorrhea, sinus pressure, sore throat, trouble swallowing and voice change.   Eyes: Negative.   Respiratory: Negative.  Negative for apnea, cough, choking, chest tightness, shortness of breath, wheezing and stridor.   Cardiovascular: Negative.  Negative for chest pain, palpitations and leg swelling.  Gastrointestinal: Negative.  Negative for abdominal distention, abdominal pain, nausea and vomiting.  Genitourinary: Negative.   Musculoskeletal: Negative.  Negative for arthralgias and myalgias.  Skin: Negative.  Negative for rash.  Allergic/Immunologic: Negative.  Negative for environmental allergies and food allergies.  Neurological: Negative.  Negative for dizziness, syncope, weakness and headaches.  Hematological: Negative.  Negative for adenopathy. Does not bruise/bleed easily.  Psychiatric/Behavioral: Negative.  Negative for agitation and sleep  disturbance. The patient is not nervous/anxious.       Objective:    Physical Exam Vitals reviewed.  Constitutional:      Appearance: Normal appearance. He is well-developed. He is not diaphoretic.  HENT:     Head: Normocephalic and atraumatic.     Nose: No nasal deformity, septal deviation, mucosal edema or rhinorrhea.     Right Sinus: No maxillary sinus tenderness or frontal sinus tenderness.     Left Sinus: No maxillary sinus tenderness or frontal sinus tenderness.     Mouth/Throat:     Pharynx: No oropharyngeal exudate.  Eyes:     General: No scleral icterus.    Conjunctiva/sclera: Conjunctivae normal.  Pupils: Pupils are equal, round, and reactive to light.  Neck:     Thyroid: No thyromegaly.     Vascular: No carotid bruit or JVD.     Trachea: Trachea normal. No tracheal tenderness or tracheal deviation.  Cardiovascular:     Rate and Rhythm: Normal rate and regular rhythm.     Chest Wall: PMI is not displaced.     Pulses: Normal pulses. No decreased pulses.     Heart sounds: Normal heart sounds, S1 normal and S2 normal. Heart sounds not distant. No murmur heard.    No systolic murmur is present.     No diastolic murmur is present.     No friction rub. No gallop. No S3 or S4 sounds.  Pulmonary:     Effort: No tachypnea, accessory muscle usage or respiratory distress.     Breath sounds: No stridor. No decreased breath sounds, wheezing, rhonchi or rales.  Chest:     Chest wall: No tenderness.  Abdominal:     General: Bowel sounds are normal. There is no distension.     Palpations: Abdomen is soft. Abdomen is not rigid.     Tenderness: There is no abdominal tenderness. There is no guarding or rebound.  Musculoskeletal:        General: Normal range of motion.     Cervical back: Normal range of motion and neck supple. No edema, erythema or rigidity. No muscular tenderness. Normal range of motion.     Comments: Normal foot exam except for mild onychomycosis both toenails  of great toes and decrease sensation medial aspect posterior heel on foot left  Lymphadenopathy:     Head:     Right side of head: No submental or submandibular adenopathy.     Left side of head: No submental or submandibular adenopathy.     Cervical: No cervical adenopathy.  Skin:    General: Skin is warm and dry.     Coloration: Skin is not pale.     Findings: No rash.     Nails: There is no clubbing.  Neurological:     Mental Status: He is alert and oriented to person, place, and time.     Sensory: No sensory deficit.  Psychiatric:        Speech: Speech normal.        Behavior: Behavior normal.     BP (!) 143/80   Pulse 63   Temp 98.3 F (36.8 C) (Oral)   Wt 163 lb (73.9 kg)   SpO2 96%   BMI 29.81 kg/m  Wt Readings from Last 3 Encounters:  04/13/22 163 lb (73.9 kg)  12/14/21 161 lb 3.7 oz (73.1 kg)  10/13/21 164 lb 3.2 oz (74.5 kg)     Health Maintenance Due  Topic Date Due   COLON CANCER SCREENING ANNUAL FOBT  02/09/2022    There are no preventive care reminders to display for this patient.  No results found for: "TSH" Lab Results  Component Value Date   WBC 5.1 05/19/2020   HGB 15.6 05/19/2020   HCT 47.1 05/19/2020   MCV 90 05/19/2020   PLT 199 05/19/2020   Lab Results  Component Value Date   NA 142 12/27/2021   K 4.2 12/27/2021   CO2 25 12/27/2021   GLUCOSE 117 (H) 12/27/2021   BUN 23 12/27/2021   CREATININE 0.62 (L) 12/27/2021   BILITOT 0.9 12/27/2021   ALKPHOS 143 (H) 12/27/2021   AST 20 12/27/2021   ALT 24 12/27/2021  PROT 7.3 12/27/2021   ALBUMIN 4.7 12/27/2021   CALCIUM 9.5 12/27/2021   ANIONGAP 10 11/07/2019   EGFR 108 12/27/2021   Lab Results  Component Value Date   CHOL 169 12/14/2021   Lab Results  Component Value Date   HDL 27 (L) 12/14/2021   Lab Results  Component Value Date   LDLCALC 66 12/14/2021   Lab Results  Component Value Date   TRIG 495 (H) 12/14/2021   Lab Results  Component Value Date   CHOLHDL 6.3 (H)  12/14/2021   Lab Results  Component Value Date   HGBA1C 6.7 12/27/2021      Assessment & Plan:   Problem List Items Addressed This Visit       Cardiovascular and Mediastinum   HTN (hypertension)    Blood pressure not at goal owing to lack of access to valsartan and also increase salt in diet  The following Lifestyle Medicine recommendations according to [facility]) were discussed and offered to patient who agrees to start the journey:  A. Whole Foods, Plant-based plate comprising of fruits and vegetables, plant-based proteins, whole-grain carbohydrates was discussed in detail with the patient.   A list for source of those nutrients were also provided to the patient.  Patient will use only water or unsweetened tea for hydration. B.  The need to stay away from risky substances including alcohol, smoking; obtaining 7 to 9 hours of restorative sleep, at least 150 minutes of moderate intensity exercise weekly, the importance of healthy social connections,  and stress reduction techniques were discussed.   Valsartan refilled today and will continue amlodipine and valsartan as prescribed      Relevant Medications   amLODipine (NORVASC) 10 MG tablet   atorvastatin (LIPITOR) 10 MG tablet   fenofibrate (TRICOR) 145 MG tablet   tadalafil (CIALIS) 10 MG tablet   valsartan (DIOVAN) 160 MG tablet     Respiratory   Postinflammatory pulmonary fibrosis (HCC)    Patient at baseline we will renew albuterol        Endocrine   Controlled type 2 diabetes mellitus (HCC) - Primary    A1c was 6.7 in February I gave the patient lifestyle medicine handout see hypertension assessment  I went over with the patient and spent about 10 minutes educating on proper diet and exercise extra time needed for language barrier using interpreter  Patient did not refill Trulicity he has patient assistance I refilled it again today and asked him to try to keep up with his monthly       Relevant Medications   atorvastatin (LIPITOR) 10 MG tablet   Dulaglutide (TRULICITY) 1.5 XI/3.3AS SOPN   metFORMIN (GLUCOPHAGE) 1000 MG tablet   valsartan (DIOVAN) 160 MG tablet   Other Relevant Orders   Urine microalbumin-creatinine with uACR     Other   Dyslipidemia    Continue statin therapy refill sent      Relevant Medications   atorvastatin (LIPITOR) 10 MG tablet   fenofibrate (TRICOR) 145 MG tablet   Mild cataract    Monitoring with yearly exam      Hypertensive retinopathy of both eyes, grade 1    Key is blood pressure control  Yearly exam  No diabetic retinopathy seen      Other Visit Diagnoses     Colon cancer screening       Relevant Orders   Fecal occult blood, imunochemical      Meds ordered this encounter  Medications  albuterol (VENTOLIN HFA) 108 (90 Base) MCG/ACT inhaler    Sig: Inhale 1-2 puffs into the lungs every 4 (four) hours as needed for wheezing or shortness of breath.    Dispense:  8.5 g    Refill:  1   amLODipine (NORVASC) 10 MG tablet    Sig: TAKE 1 TABLET (10 MG TOTAL) BY MOUTH DAILY.    Dispense:  90 tablet    Refill:  1   atorvastatin (LIPITOR) 10 MG tablet    Sig: TAKE 1 TABLET (10 MG TOTAL) BY MOUTH DAILY.    Dispense:  90 tablet    Refill:  3   clobetasol cream (TEMOVATE) 0.05 %    Sig: Apply topically 2 (two) times daily.    Dispense:  30 g    Refill:  1   cyclobenzaprine (FLEXERIL) 10 MG tablet    Sig: Take 1 tablet (10 mg total) by mouth 3 (three) times daily.    Dispense:  60 tablet    Refill:  1   Dulaglutide (TRULICITY) 1.5 YF/1.1MY SOPN    Sig: Inject 1.5 mg into the skin once a week.    Dispense:  2 mL    Refill:  3   fenofibrate (TRICOR) 145 MG tablet    Sig: Take 1 tablet (145 mg total) by mouth daily.    Dispense:  60 tablet    Refill:  1   glucose blood (TRUE METRIX BLOOD GLUCOSE TEST) test strip    Sig: Use as instructed    Dispense:  100 each    Refill:  12   metFORMIN (GLUCOPHAGE) 1000 MG  tablet    Sig: Take 1 tablet (1,000 mg total) by mouth daily with breakfast.    Dispense:  90 tablet    Refill:  3   omeprazole (PRILOSEC) 40 MG capsule    Sig: TAKE 1 CAPSULE (40 MG TOTAL) BY MOUTH DAILY.    Dispense:  30 capsule    Refill:  3   tadalafil (CIALIS) 10 MG tablet    Sig: Take 1 tablet (10 mg total) by mouth every other day as needed for erectile dysfunction.    Dispense:  10 tablet    Refill:  1   TRUEplus Lancets 28G MISC    Sig: Use to measure blood sugar twice a day    Dispense:  100 each    Refill:  1   valsartan (DIOVAN) 160 MG tablet    Sig: TAKE 1 TABLET (160 MG TOTAL) BY MOUTH DAILY.    Dispense:  90 tablet    Refill:  1   41 minutes spent extra time needed because of language barrier multiple systems assessed patient adherence to medication stressed extra time spent educating on a lifestyle medicine approach need for refills of medicines regularly  Patient will get urine microalbumin today and also pick up colon cancer screening Follow-up: Return in about 4 months (around 08/13/2022).    Asencion Noble, MD

## 2022-04-13 NOTE — Assessment & Plan Note (Signed)
A1c was 6.7 in February I gave the patient lifestyle medicine handout see hypertension assessment  I went over with the patient and spent about 10 minutes educating on proper diet and exercise extra time needed for language barrier using interpreter  Patient did not refill Trulicity he has patient assistance I refilled it again today and asked him to try to keep up with his monthly

## 2022-04-13 NOTE — Assessment & Plan Note (Signed)
Key is blood pressure control  Yearly exam  No diabetic retinopathy seen

## 2022-04-13 NOTE — Assessment & Plan Note (Signed)
Blood pressure not at goal owing to lack of access to valsartan and also increase salt in diet  The following Lifestyle Medicine recommendations according to [facility]) were discussed and offered to patient who agrees to start the journey:  A. Whole Foods, Plant-based plate comprising of fruits and vegetables, plant-based proteins, whole-grain carbohydrates was discussed in detail with the patient.   A list for source of those nutrients were also provided to the patient.  Patient will use only water or unsweetened tea for hydration. B.  The need to stay away from risky substances including alcohol, smoking; obtaining 7 to 9 hours of restorative sleep, at least 150 minutes of moderate intensity exercise weekly, the importance of healthy social connections,  and stress reduction techniques were discussed.   Valsartan refilled today and will continue amlodipine and valsartan as prescribed

## 2022-04-13 NOTE — Addendum Note (Signed)
Addended by: Carilyn Goodpasture on: 04/13/2022 03:04 PM   Modules accepted: Orders

## 2022-04-13 NOTE — Patient Instructions (Addendum)
Get back on your blood pressure medicines and make sure you refill all your medicines do not run out refills all sent to the pharmacy go to the pharmacy today to pick up all your medications  Colon cancer screening kit will be given today and a urine study will be done to check for protein in the urine  Follow the lifestyle medicine handout for diet and exercise you need to be walking 30 minutes continuously 5 days a week   Return to see Walter Phillips 4 months  Vuelva a tomar sus medicamentos para la presin arterial y Chief Strategy Officer de Location manager a surtir todos sus medicamentos no se quede sin Therapist, sports todas enviadas a la farmacia vaya a la farmacia hoy para recoger todos sus medicamentos  Hoy se entregar un kit de Pensions consultant de colon y se realizar un estudio de orina para verificar la presencia de protenas en la orina.  Siga el folleto de medicina de estilo de vida para la dieta y el ejercicio que necesita para caminar 30 minutos seguidos 5 das a la [employer] a ver al Walter Phillips 4 meses

## 2022-04-13 NOTE — Assessment & Plan Note (Signed)
Monitoring with yearly exam

## 2022-04-13 NOTE — Progress Notes (Signed)
Medication R F/u DM and HTN  Fasting CBG- 170

## 2022-04-14 LAB — MICROALBUMIN / CREATININE URINE RATIO
Creatinine, Urine: 119.2 mg/dL
Microalb/Creat Ratio: 20 mg/g creat (ref 0–29)
Microalbumin, Urine: 23.3 ug/mL

## 2022-04-15 NOTE — Progress Notes (Signed)
Let pt know urine for albumen was normal no kidney damage from diabetes

## 2022-04-17 ENCOUNTER — Telehealth: Payer: Self-pay

## 2022-04-17 NOTE — Telephone Encounter (Signed)
-----   Message from Elsie Stain, MD sent at 04/15/2022  8:51 AM EDT ----- Let pt know urine for albumen was normal no kidney damage from diabetes

## 2022-04-17 NOTE — Telephone Encounter (Signed)
Pt was called and is aware of results, DOB was confirmed.   Interpreter [id number]

## 2022-04-20 ENCOUNTER — Other Ambulatory Visit: Payer: Self-pay

## 2022-06-01 IMAGING — CT CT CHEST W/O CM
2 of 4 series · 14 of 36 positions shown, 17 images · non-contrast
Comparison: 11/13/2019

CLINICAL DATA: Interstitial lung disease, history of COVID
pneumonia

EXAM:
CT CHEST WITHOUT CONTRAST
TECHNIQUE: Multidetector CT imaging of the chest was performed following the
standard protocol without IV contrast.

[Series 3: chest wo · axial · 0.81mm/px · z∈[+1153,+1407]mm · 11 of 151 slices shown, 14 images]
[im 12/151  mediastinal]
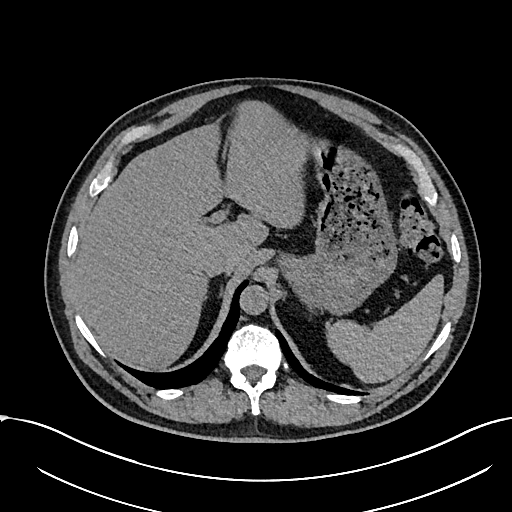
[im 12/151  lung]
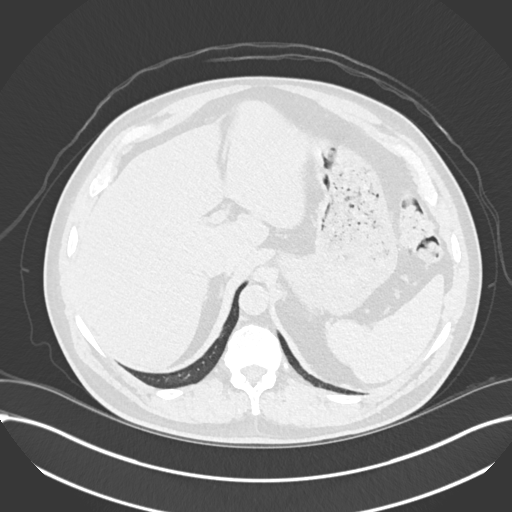
[im 24/151  lung]
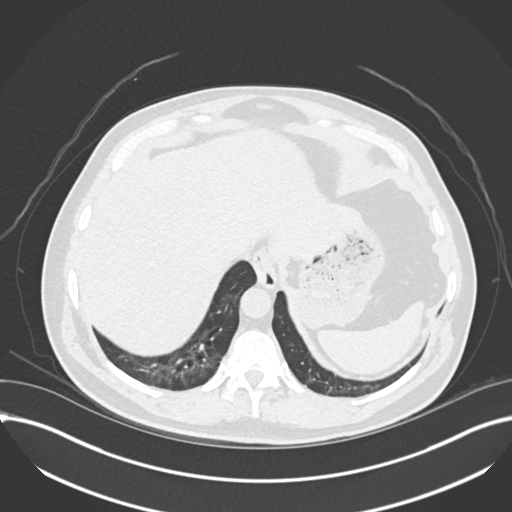
[im 35/151  lung]
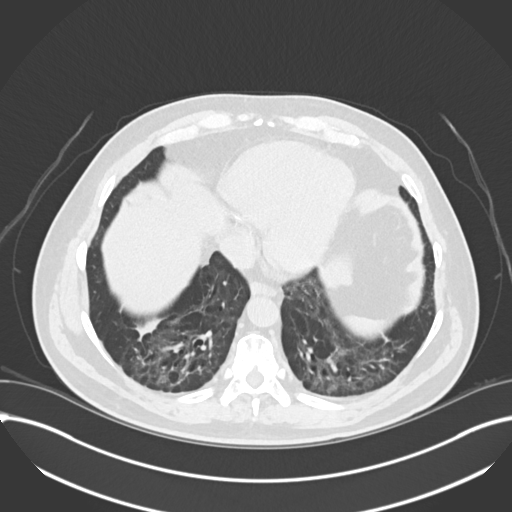
[im 47/151  lung]
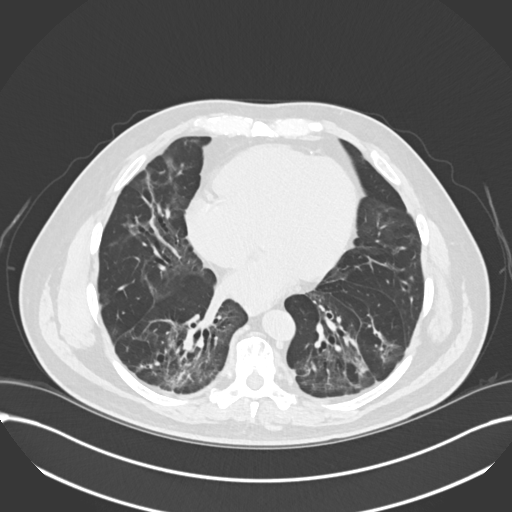
[im 58/151  mediastinal]
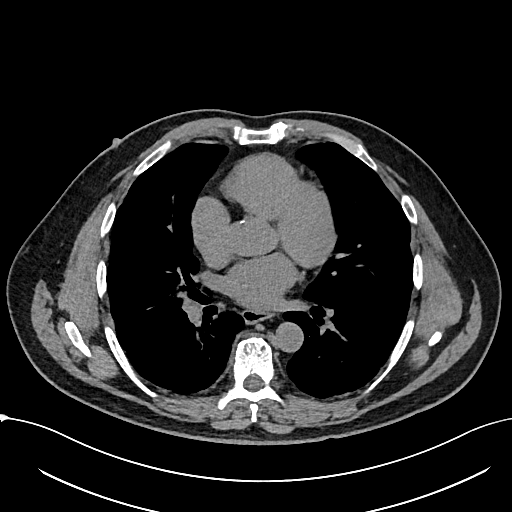
[im 58/151  lung]
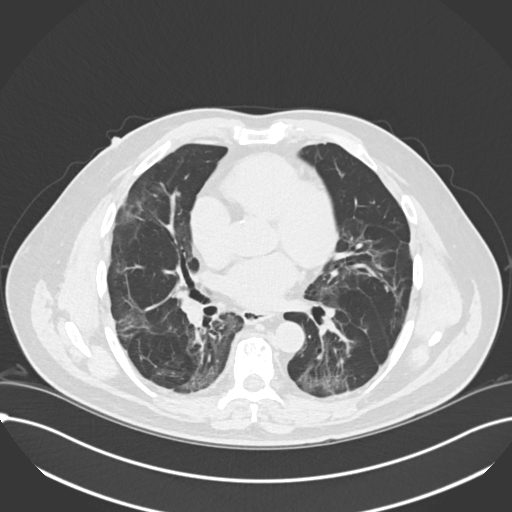
[im 81/151  lung]
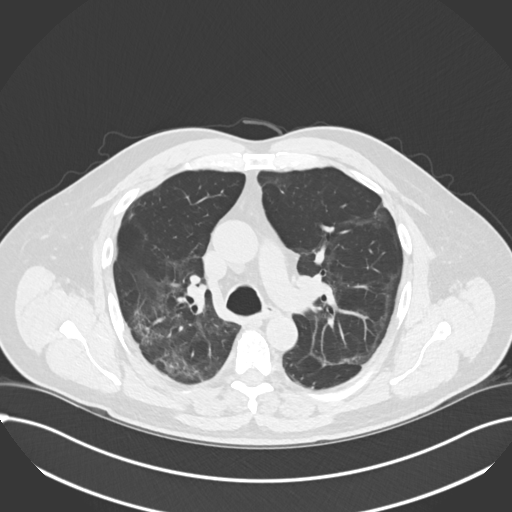
[im 93/151  lung]
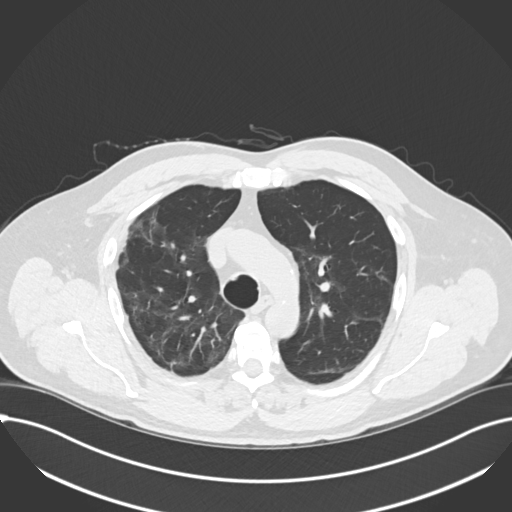
[im 104/151  lung]
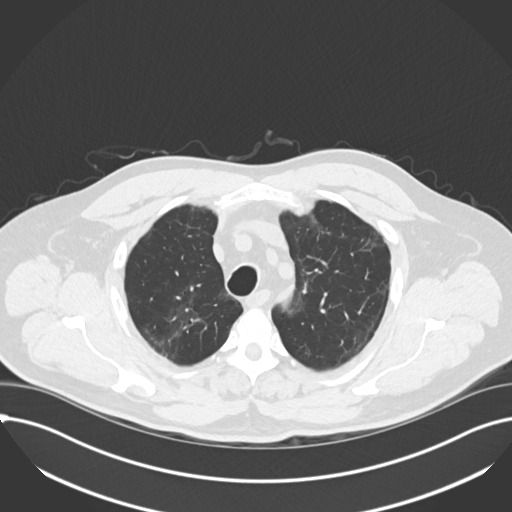
[im 116/151  mediastinal]
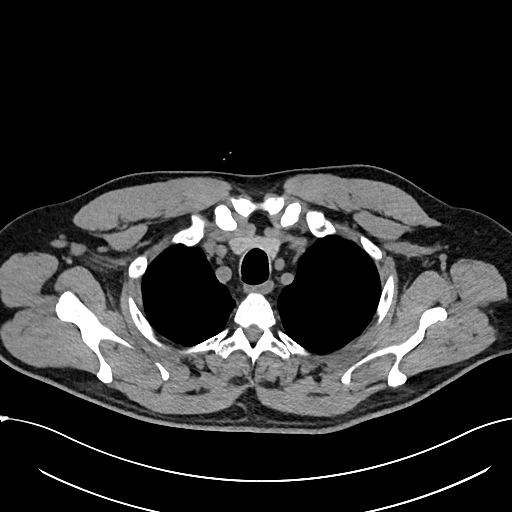
[im 116/151  lung]
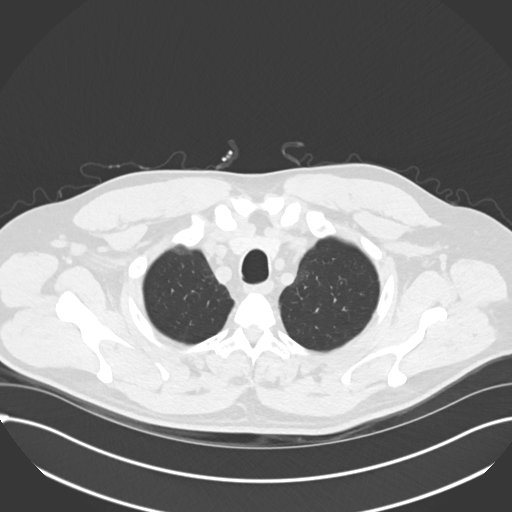
[im 127/151  lung]
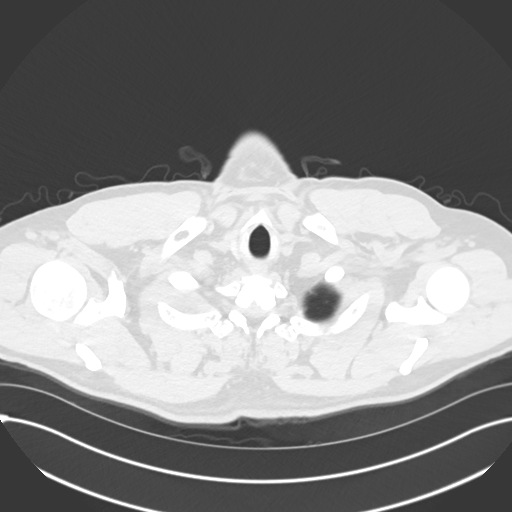
[im 139/151  lung]
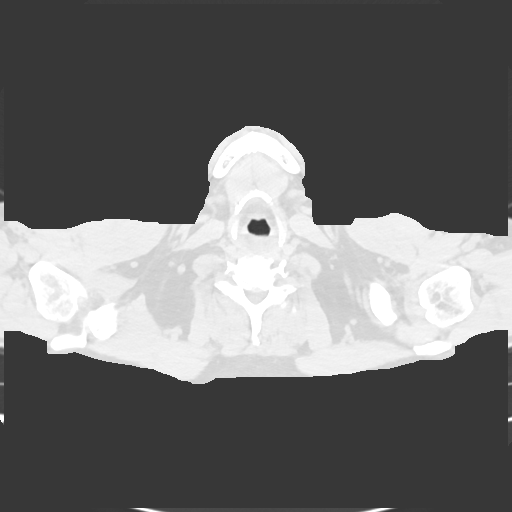

[Series 6: cor · coronal · 0.59mm/px · 3 of 154 slices shown]
[im 31/154  lung]
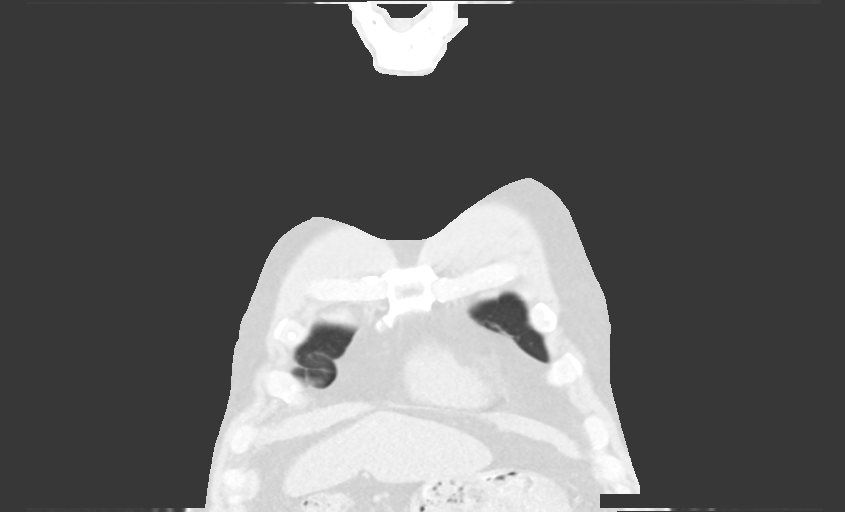
[im 62/154  lung]
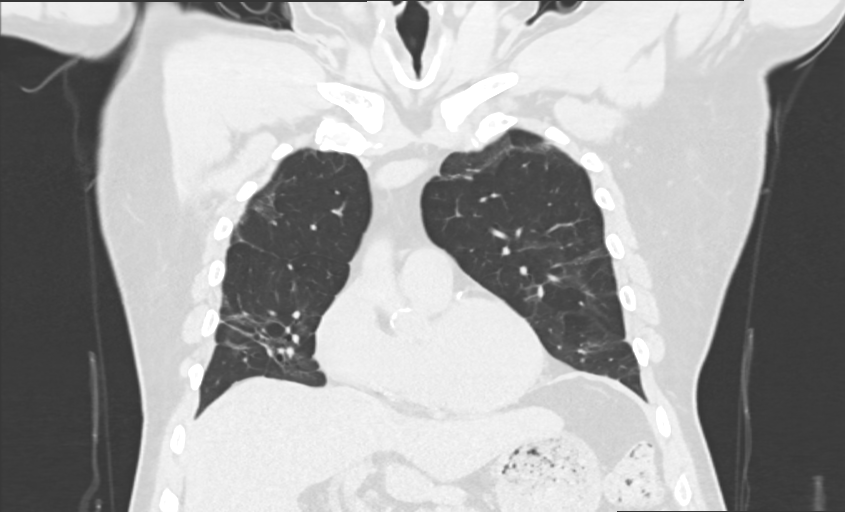
[im 92/154  lung]
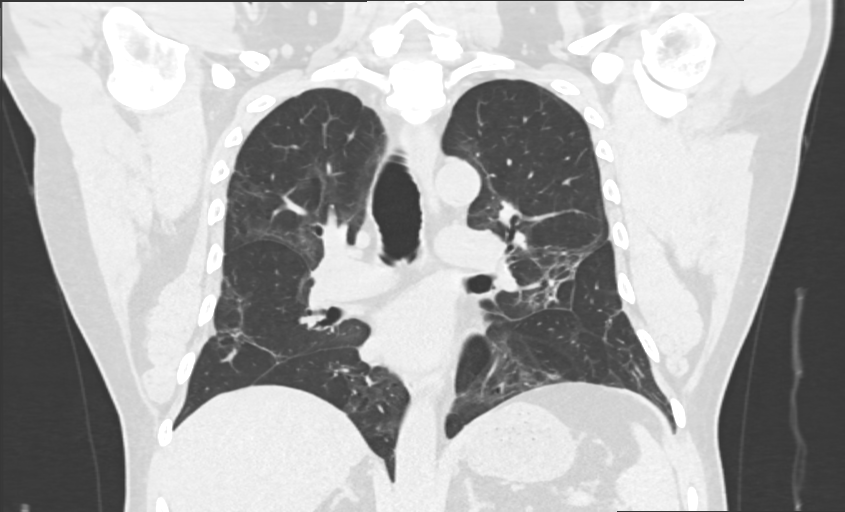

[14 of 36 positions shown; findings below may reference images not displayed]

FINDINGS: Cardiovascular: Scattered aortic atherosclerosis. Normal heart size.
Three-vessel coronary artery calcifications. No pericardial
effusion.

Mediastinum/Nodes: No enlarged mediastinal, hilar, or axillary lymph
nodes. Thyroid gland, trachea, and esophagus demonstrate no
significant findings.

Lungs/Pleura: There is extensive, irregular, bandlike scarring
throughout the lungs with tubular bronchiectasis and architectural
distortion in the bilateral lower lobes. There has been interval
resolution of previously seen acute, dense heterogeneous airspace
disease. No pleural effusion or pneumothorax.

Upper Abdomen: No acute abnormality.

Musculoskeletal: No chest wall mass or suspicious bone lesions
identified.
IMPRESSION: 1. Extensive, irregular, bandlike scarring throughout the lungs with
tubular bronchiectasis and architectural distortion in the bilateral
lower lobes. There has been interval resolution of previously seen
acute, dense heterogeneous airspace disease. Findings are consistent
with post infectious scarring status post COVID infection.
2. Coronary artery disease.  Aortic Atherosclerosis ([id number]).

## 2022-08-14 ENCOUNTER — Ambulatory Visit: Payer: Self-pay | Admitting: Critical Care Medicine

## 2022-08-16 ENCOUNTER — Ambulatory Visit: Payer: Self-pay | Admitting: Physician Assistant

## 2022-08-23 ENCOUNTER — Encounter: Payer: Self-pay | Admitting: Physician Assistant

## 2022-08-23 ENCOUNTER — Ambulatory Visit: Payer: Self-pay | Attending: Critical Care Medicine | Admitting: Physician Assistant

## 2022-08-23 ENCOUNTER — Other Ambulatory Visit: Payer: Self-pay

## 2022-08-23 VITALS — BP 155/82 | HR 56 | Ht 61.5 in | Wt 162.2 lb

## 2022-08-23 DIAGNOSIS — R0789 Other chest pain: Secondary | ICD-10-CM

## 2022-08-23 DIAGNOSIS — E119 Type 2 diabetes mellitus without complications: Secondary | ICD-10-CM

## 2022-08-23 DIAGNOSIS — N521 Erectile dysfunction due to diseases classified elsewhere: Secondary | ICD-10-CM

## 2022-08-23 DIAGNOSIS — Z91199 Patient's noncompliance with other medical treatment and regimen due to unspecified reason: Secondary | ICD-10-CM

## 2022-08-23 DIAGNOSIS — E1165 Type 2 diabetes mellitus with hyperglycemia: Secondary | ICD-10-CM

## 2022-08-23 DIAGNOSIS — I10 Essential (primary) hypertension: Secondary | ICD-10-CM

## 2022-08-23 DIAGNOSIS — Z789 Other specified health status: Secondary | ICD-10-CM

## 2022-08-23 DIAGNOSIS — E785 Hyperlipidemia, unspecified: Secondary | ICD-10-CM

## 2022-08-23 LAB — POCT GLYCOSYLATED HEMOGLOBIN (HGB A1C): HbA1c, POC (controlled diabetic range): 8.5 % — AB (ref 0.0–7.0)

## 2022-08-23 LAB — GLUCOSE, POCT (MANUAL RESULT ENTRY): POC Glucose: 220 mg/dl — AB (ref 70–99)

## 2022-08-23 MED ORDER — VALSARTAN 160 MG PO TABS
ORAL_TABLET | Freq: Every day | ORAL | 1 refills | Status: DC
  Filled 2022-08-23: qty 90, 90d supply, fill #0

## 2022-08-23 MED ORDER — OMEPRAZOLE 40 MG PO CPDR
DELAYED_RELEASE_CAPSULE | Freq: Every day | ORAL | 3 refills | Status: DC
  Filled 2022-08-23: qty 30, 30d supply, fill #0

## 2022-08-23 MED ORDER — ALBUTEROL SULFATE HFA 108 (90 BASE) MCG/ACT IN AERS
1.0000 | INHALATION_SPRAY | RESPIRATORY_TRACT | 1 refills | Status: DC | PRN
  Filled 2022-08-23: qty 6.7, 25d supply, fill #0

## 2022-08-23 MED ORDER — TRULICITY 1.5 MG/0.5ML ~~LOC~~ SOAJ
1.5000 mg | SUBCUTANEOUS | 3 refills | Status: DC
  Filled 2022-08-23: qty 2, 28d supply, fill #0

## 2022-08-23 MED ORDER — CYCLOBENZAPRINE HCL 10 MG PO TABS
10.0000 mg | ORAL_TABLET | Freq: Three times a day (TID) | ORAL | 1 refills | Status: DC
  Filled 2022-08-23: qty 60, 20d supply, fill #0

## 2022-08-23 MED ORDER — METFORMIN HCL 1000 MG PO TABS
1000.0000 mg | ORAL_TABLET | Freq: Every day | ORAL | 3 refills | Status: DC
  Filled 2022-08-23: qty 90, 90d supply, fill #0

## 2022-08-23 MED ORDER — ASPIRIN 81 MG PO TBEC: 81.0000 mg | DELAYED_RELEASE_TABLET | Freq: Every day | ORAL | 12 refills | Status: DC

## 2022-08-23 MED ORDER — CLOBETASOL PROPIONATE 0.05 % EX CREA
TOPICAL_CREAM | Freq: Two times a day (BID) | CUTANEOUS | 1 refills | Status: DC
  Filled 2022-08-23: qty 30, 15d supply, fill #0

## 2022-08-23 MED ORDER — TADALAFIL 10 MG PO TABS
10.0000 mg | ORAL_TABLET | ORAL | 1 refills | Status: DC | PRN
  Filled 2022-08-23: qty 10, 20d supply, fill #0

## 2022-08-23 MED ORDER — AMLODIPINE BESYLATE 10 MG PO TABS
ORAL_TABLET | Freq: Every day | ORAL | 1 refills | Status: DC
  Filled 2022-08-23: qty 90, 90d supply, fill #0

## 2022-08-23 MED ORDER — FENOFIBRATE 145 MG PO TABS
145.0000 mg | ORAL_TABLET | Freq: Every day | ORAL | 1 refills | Status: DC
  Filled 2022-08-23: qty 60, 60d supply, fill #0

## 2022-08-23 MED ORDER — ATORVASTATIN CALCIUM 10 MG PO TABS
ORAL_TABLET | Freq: Every day | ORAL | 3 refills | Status: DC
  Filled 2022-08-23: qty 90, 90d supply, fill #0

## 2022-08-23 NOTE — [place] Notes (Signed)
Patient ID: Walter Phillips, male   DOB: 1960/12/14, 61 y.o.   MRN: [id number]   Walter Phillips, is a 61 y.o. male  WLN:[id number]  DEY:[id number]  DOB - 1960/11/29  Chief Complaint  Patient presents with   Diabetes       Subjective:   Walter Phillips is a 61 y.o. male here today for a follow up visit and med RF.  He has not been taking any if his medications for about 3 months.  He says he wanted to try without them.    Also c/o intermittent chest pressure that develops with activity and improves with rest.  It IS associated with SOB.  No radiating pain and denies it being painful.  No diaphoresis or arm/jaw pain.  It occurs 1-2 times weekly for the last several months.  No pain currently or within  the last week  No problems updated.  ALLERGIES: No Known Allergies  PAST MEDICAL HISTORY: Past Medical History:  Diagnosis Date   Chronic respiratory failure with hypoxia ([place]) 10/15/2019   Diabetes mellitus, type II ([place])    Dyslipidemia     MEDICATIONS AT HOME: Prior to Admission medications   Medication Sig Start Date End Date Taking? Authorizing Provider  aspirin EC 81 MG tablet Take 1 tablet (81 mg total) by mouth daily. Swallow whole. 08/23/22  Yes Rito Lecomte, Dionne Bucy, PA-C  albuterol (PROAIR HFA) 108 (90 Base) MCG/ACT inhaler Inhale 1-2 puffs into the lungs once every 4 (four) hours as needed for wheezing or shortness of breath. 08/23/22   Argentina Donovan, PA-C  amLODipine (NORVASC) 10 MG tablet TAKE 1 TABLET (10 MG TOTAL) BY MOUTH DAILY. 08/23/22   Argentina Donovan, PA-C  atorvastatin (LIPITOR) 10 MG tablet TAKE 1 TABLET (10 MG TOTAL) BY MOUTH DAILY. 08/23/22   Argentina Donovan, PA-C  Blood Glucose Monitoring Suppl (TRUE METRIX METER) w/Device KIT Use to measure blood sugar twice a day Patient not taking: Reported on 08/23/2022 11/10/19   Elsie Stain, MD  clobetasol cream (TEMOVATE) 3.14 % Apply 1 application topically to the affected area(s) 2 (two) times daily. 08/23/22    Argentina Donovan, PA-C  cyclobenzaprine (FLEXERIL) 10 MG tablet Take 1 tablet (10 mg total) by mouth 3 (three) times daily. 08/23/22   Argentina Donovan, PA-C  Dulaglutide (TRULICITY) 1.5 HF/0.2OV SOPN Inject 1.5 mg into the skin once a week. 08/23/22   Argentina Donovan, PA-C  fenofibrate (TRICOR) 145 MG tablet Take 1 tablet (145 mg total) by mouth once daily. 08/23/22   Argentina Donovan, PA-C  glucose blood (TRUE METRIX BLOOD GLUCOSE TEST) test strip Use as instructed Patient not taking: Reported on 08/23/2022 04/13/22   Elsie Stain, MD  metFORMIN (GLUCOPHAGE) 1000 MG tablet Take 1 tablet (1,000 mg total) by mouth once daily with breakfast. 08/23/22   Argentina Donovan, PA-C  olopatadine (PATANOL) 0.1 % ophthalmic solution Place 1 drop into the left eye 2 (two) times daily. Patient not taking: Reported on 08/23/2022 05/19/20   Elsie Stain, MD  omeprazole (PRILOSEC) 40 MG capsule TAKE 1 CAPSULE (40 MG TOTAL) BY MOUTH DAILY. 08/23/22   Argentina Donovan, PA-C  TRUEplus Lancets 28G MISC Use to measure blood sugar twice a day Patient not taking: Reported on 08/23/2022 04/13/22   Elsie Stain, MD  valsartan (DIOVAN) 160 MG tablet TAKE 1 TABLET (160 MG TOTAL) BY MOUTH ONCE DAILY. 08/23/22   Argentina Donovan, PA-C    ROS: Neg HEENT Neg  resp Neg GI Neg GU Neg MS Neg psych Neg neuro  Objective:   Vitals:   08/23/22 0912  BP: (!) 155/82  Pulse: (!) 56  SpO2: 96%  Weight: 162 lb 3.2 oz (73.6 kg)  Height: 5' 1.5" (1.562 m)   Exam General appearance : Awake, alert, not in any distress. Speech Clear. Not toxic looking HEENT: Atraumatic and Normocephalic Neck: Supple, no JVD. No cervical lymphadenopathy.  Chest: Good air entry bilaterally, CTAB.  No rales/rhonchi/wheezing CVS: S1 S2 regular, no murmurs.  Extremities: B/L Lower Ext shows no edema, both legs are warm to touch Neurology: Awake alert, and oriented X 3, CN II-XII intact, Non focal Skin: No Rash  Data  Review Lab Results  Component Value Date   HGBA1C 8.5 (A) 08/23/2022   HGBA1C 6.7 12/27/2021   HGBA1C 8.2 (A) 10/13/2021    Assessment & Plan   1. Not Controlled type 2 diabetes mellitus with hyperglycemia, unspecified whether long term insulin use (HCC) Resume trulicity, metformin.  Compliance imperative and discussed at length.  Check blood sugars bid as he resumes meds and f/up Luke in 3-4 weeks - Glucose (CBG) - HgB A1c - Comprehensive metabolic panel  2. Dyslipidemia Resume fenofibrate and atorvastatin - Lipid panel  3. Primary hypertension Resume valsartan - Comprehensive metabolic panel  4. Erectile dysfunction and diabetes Canceled cialis until cardiology workup.  Do not take!!!  Do NOT RF until/unless cleared by cardiology  5. Chest pressure-EKG with NSR and no ST changes Suspicious for angina.  CP warnings reviewed at length with patient through interpreter.  Call 911 if worsens without hesitation.  Urgent referral to cardiology and start baby aspirin daily.  Patient verbalizes understanding and reviewed X 3 - aspirin EC 81 MG tablet; Take 1 tablet (81 mg total) by mouth daily. Swallow whole.  Dispense: 30 tablet; Refill: 12 - Ambulatory referral to Cardiology  6. Language barrier [facility] "Cory Roughen" interpreters used and additional time performing visit was required.   7. Noncompliance Compliance imperative-he verbalizes understanding    Return in about 3 weeks (around 09/13/2022) for [facility] for DM and htn and 3 months with PCP.  The patient was given clear instructions to go to ER or return to medical center if symptoms don't improve, worsen or new problems develop. The patient verbalized understanding. The patient was told to call to get lab results if they haven't heard anything in the next week.      Freeman Caldron, PA-C [facility] [place], [place]   08/23/2022, 9:35 AM

## 2022-08-23 NOTE — Patient Instructions (Signed)
Angina Angina  La angina o angina de pecho es un malestar o dolor que se siente en el pecho, el cuello, el brazo, la [place] o la espalda. La causa de este malestar es la falta de sangre en la capa media de la pared del corazn. La capa media de la pared del corazn se llama miocardio. Cules son las causas? Esta afeccin es causada por la acumulacin de grasa y colesterol, o placa, en las arterias. Esta acumulacin estrecha las arterias y dificulta el flujo de [place]. Qu incrementa el riesgo? Principales riesgos Niveles altos de colesterol en la [place]. Presin arterial alta. Diabetes. Antecedentes familiares de enfermedades cardacas. No hacer ejercicio o moverse lo suficiente. Depresin. Haber recibido tratamiento de radiacin en el lado izquierdo del pecho. Otros riesgos Consumo de tabaco. Location manager de peso corporal (obesidad). Una alimentacin con alto contenido de grasas poco saludables (grasas saturadas). Estrs. Usar drogas, como cocana. Riesgos para las [place] mayor de 57 aos. Estar en la menopausia. Este es el momento en el que una mujer ya no tiene el perodo menstrual. Cules son los signos o sntomas? Sntomas en todas las personas Dolor de pecho que puede: Sentirse como un dolor constrictivo u opresivo en el pecho. Ocasionar sensacin de opresin, presin, distensin o pesadez en el pecho. Durar ms que unos pocos minutos por vez. Detenerse y Stage manager (ser recurrente) despus de unos minutos. Dolor en el cuello, el brazo, la [place] o la espalda. Acidez estomacal o molestia estomacal (indigestin) sin causa. Falta de aire. Sensacin de que va a vomitar (nuseas). Sudor fro repentino. Sntomas en mujeres y personas con diabetes Cansancio. Preocupacin y la ansiedad. Debilidad. Mareos o [employer]. Cmo se trata? El tratamiento de esta afeccin puede incluir: Medicamentos. Estos pueden administrarse para prevenir cogulos de sangre, Scientist, water quality un  infarto de miocardio, bajar la presin arterial o tratar otros factores de riesgo. Un procedimiento que se realiza para ensanchar una arteria estrechada u obstruida en el corazn. [place] para permitir que la sangre circule alrededor de una arteria obstruida. Siga estas instrucciones en su casa: Medicamentos Use los medicamentos de venta libre y los recetados solamente como se lo haya indicado el mdico. No tome estos medicamentos a menos que el mdico lo autorice: Antiinflamatorios no esteroides (AINE). Estos incluyen: Ibuprofeno. Naproxeno. Suplementos vitamnicos que contienen vitamina A, vitamina E o ambas. Tratamientos hormonales que contienen estrgeno con o sin progestina. Comida y bebida  Consuma una dieta cardiosaludable que incluya: Ardelia Mems gran cantidad de frutas y verduras frescas. Cereales integrales. Protena con bajo contenido de grasa [place]). Productos lcteos descremados. Siga las indicaciones del mdico respecto de lo que no puede comer o beber. Actividad Siga el programa de ejercicios que le indique su mdico. Hable con su mdico sobre unirse a un programa para volver a fortalecer su corazn (rehabilitacin cardaca). Haga una pausa cuando se sienta cansado. Planifique pausas si sabe que se sentir cansado. Estilo de vida  No fume ni consuma ningn producto que contenga nicotina o tabaco. Si necesita ayuda para dejar de consumir estos productos, consulte al mdico. Si el mdico dice que puede tomar alcohol: Limite la cantidad que bebe a lo siguiente: De 0 a 1 medida por da para las mujeres que no estn embarazadas. De 0 a 2 medidas por da para los hombres. Sepa cunta cantidad de alcohol hay en las bebidas que toma. En los [place], una medida equivale a una botella de cerveza de 12 oz (355 ml), un vaso de vino de 5 oz (  148 ml) o un vaso de una bebida alcohlica de alta graduacin de 1 oz (44 ml). Indicaciones generales Mantenga un peso saludable. Si debe  bajar de peso, trabaje con su mdico para hacerlo de manera segura. Aprenda a Engineer, maintenance (IT). Si necesita ayuda, consulte al mdico. [place]. [place] la vacuna antigripal todos los [place]. Hable con el mdico si se siente triste. Para saber si corre riesgo de sufrir depresin, realcese una prueba de deteccin. Trabaje con su mdico para mantener bajo control cualquier otro problema de [employer]. Estos pueden incluir diabetes o presin arterial alta. Cumpla con todas las visitas de seguimiento. Solicite ayuda de inmediato si: [employer], el cuello, el brazo, la [place] o la [place], y el dolor: Dura ms de unos minutos. Se repite. No mejora despus de tomar medicamentos sublinguales (nitroglicerina sublingual). Sigue empeorando. Viene con ms frecuencia. Tiene alguno de estos problemas: Sudoracin abundante. Acidez estomacal o Higher education careers adviser. Falta de aire. Dificultad para respirar. Ganas de vomitar. Vmitos. Sentirse ms cansado de lo normal. Sentirse nervioso o ms preocupado que lo normal. Debilidad. Siente un mareo o aturdimiento repentino. Se desmaya. Estos sntomas pueden Sales executive. Solicite ayuda de inmediato. Comunquese con el servicio de emergencias de su localidad (911 en los [place]). No espere a ver si los sntomas desaparecen. No conduzca por sus propios medios [employer]. Resumen La angina, o angina de pecho, es un Tree surgeon o dolor que se siente en el pecho, el cuello, el brazo, la [place] o la espalda. Los sntomas incluyen dolor en el pecho, Geographical information systems officer estomacal o [employer], y falta de [place]. [place] y las personas con diabetes pueden tener otros sntomas, como sentirse nerviosas, preocupadas, o dbiles y cansadas. Use todos los medicamentos solamente como se lo haya indicado el mdico. Debera ingerir una dieta cardiosaludable y seguir un programa de [place]. Esta informacin  no tiene Marine scientist el consejo del mdico. Asegrese de hacerle al mdico cualquier pregunta que tenga. Document Revised: 05/20/2020 Document Reviewed: 05/20/2020 Elsevier Patient Education  [place].

## 2022-08-24 LAB — COMPREHENSIVE METABOLIC PANEL
ALT: 37 IU/L (ref 0–44)
AST: 26 IU/L (ref 0–40)
Albumin/Globulin Ratio: 1.4 (ref 1.2–2.2)
Albumin: 4.6 g/dL (ref 3.9–4.9)
Alkaline Phosphatase: 148 IU/L — ABNORMAL HIGH (ref 44–121)
BUN/Creatinine Ratio: 21 (ref 10–24)
BUN: 13 mg/dL (ref 8–27)
Bilirubin Total: 1.1 mg/dL (ref 0.0–1.2)
CO2: 25 mmol/L (ref 20–29)
Calcium: 9.8 mg/dL (ref 8.6–10.2)
Chloride: 97 mmol/L (ref 96–106)
Creatinine, Ser: 0.61 mg/dL — ABNORMAL LOW (ref 0.76–1.27)
Globulin, Total: 3.3 g/dL (ref 1.5–4.5)
Glucose: 195 mg/dL — ABNORMAL HIGH (ref 70–99)
Potassium: 4.5 mmol/L (ref 3.5–5.2)
Sodium: 137 mmol/L (ref 134–144)
Total Protein: 7.9 g/dL (ref 6.0–8.5)
eGFR: 108 mL/min/{1.73_m2} (ref >59)

## 2022-08-24 LAB — LIPID PANEL
Chol/HDL Ratio: 10.2 ratio — ABNORMAL HIGH (ref 0.0–5.0)
Cholesterol, Total: 266 mg/dL — ABNORMAL HIGH (ref 100–199)
HDL: 26 mg/dL — ABNORMAL LOW (ref >39)
Triglycerides: 904 mg/dL (ref 0–149)

## 2022-08-30 ENCOUNTER — Other Ambulatory Visit: Payer: Self-pay

## 2022-08-30 NOTE — [place] Notes (Unsigned)
Cardiology Office Note   Date:  08/31/2022   ID:  Walter Phillips, DOB Jan 29, 1961, MRN [id number]  PCP:  Walter Stain, MD  Cardiologist:   Minus Breeding, MD Referring:  Walter Donovan, PA-C  Chief Complaint  Patient presents with   Chest Pain      History of Present Illness: Walter Phillips is a 61 y.o. male who presents for evaluation of chest pain: He has had no prior cardiac history.   I do note that a previous CT done in 2021 suggested coronary artery disease and aortic atherosclerosis .  He has had this done to follow-up some postinflammatory lung changes.  He reports that he gets some chest discomfort that is 3 out of 10 in intensity.  It is a mid discomfort.  It is a burning and he does not describe associated nausea vomiting or diaphoresis.  There is no radiation of the jaw or to the arms.  He feels like he burps and it goes away.  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  He exercises.  He walks and sometimes runs.  He might get tired and have to take deep breaths when he is running.  He may or may not bring on this discomfort.  He has never had any prior cardiac testing.  He is not describing palpitations, presyncope or syncope.  Is not having any PND or orthopnea  This was done through a [place] interpreter.   Past Medical History:  Diagnosis Date   Chronic respiratory failure with hypoxia ([place]) 10/15/2019   Diabetes mellitus, type II ([place])    Dyslipidemia     Past Surgical History:  Procedure Laterality Date   APPENDECTOMY     LAPAROSCOPIC CHOLECYSTECTOMY     LAPAROSCOPIC SIGMOID COLECTOMY     VENTRAL HERNIA REPAIR  05/27/2014     Current Outpatient Medications  Medication Sig Dispense Refill   amLODipine (NORVASC) 10 MG tablet TAKE 1 TABLET (10 MG TOTAL) BY MOUTH DAILY. 90 tablet 1   aspirin EC 81 MG tablet Take 1 tablet (81 mg total) by mouth daily. Swallow whole. 30 tablet 12   atorvastatin (LIPITOR) 10 MG tablet TAKE 1 TABLET (10 MG  TOTAL) BY MOUTH DAILY. 90 tablet 3   Blood Glucose Monitoring Suppl (TRUE METRIX METER) w/Device KIT Use to measure blood sugar twice a day 1 kit 0   clobetasol cream (TEMOVATE) 1.63 % Apply 1 application topically to the affected area(s) 2 (two) times daily. 30 g 1   cyclobenzaprine (FLEXERIL) 10 MG tablet Take 1 tablet (10 mg total) by mouth 3 (three) times daily. 60 tablet 1   Dulaglutide (TRULICITY) 1.5 WG/6.6ZL SOPN Inject 1.5 mg into the skin once a week. 2 mL 3   fenofibrate (TRICOR) 145 MG tablet Take 1 tablet (145 mg total) by mouth once daily. 60 tablet 1   glucose blood (TRUE METRIX BLOOD GLUCOSE TEST) test strip Use as instructed 100 each 12   metFORMIN (GLUCOPHAGE) 1000 MG tablet Take 1 tablet (1,000 mg total) by mouth once daily with breakfast. 90 tablet 3   olopatadine (PATANOL) 0.1 % ophthalmic solution Place 1 drop into the left eye 2 (two) times daily. 5 mL 0   omeprazole (PRILOSEC) 40 MG capsule TAKE 1 CAPSULE (40 MG TOTAL) BY MOUTH DAILY. 30 capsule 3   TRUEplus Lancets 28G MISC Use to measure blood sugar twice a day 100 each 1   valsartan (DIOVAN) 160 MG tablet TAKE 1 TABLET (160  MG TOTAL) BY MOUTH ONCE DAILY. 90 tablet 1   No current facility-administered medications for this visit.    Allergies:   Patient has no known allergies.    Social History:  The patient  reports that he has quit smoking. His smoking use included cigarettes. He smoked an average of .5 packs per day. He has never used smokeless tobacco. He reports current alcohol use. He reports that he does not use drugs.   Family History:  The patient's family history includes Cancer in his father; Diabetes in his daughter; Early death in his mother; Stomach cancer (age of onset: 46) in his sister; Stroke in his sister.    ROS:  Please see the history of present illness.   Otherwise, review of systems are positive for none.   All other systems are reviewed and negative.    PHYSICAL EXAM: VS:  BP 128/74    Pulse 60   Ht _0  (1.575 m)   Wt 162 lb (73.5 kg)   SpO2 95%   BMI 29.63 kg/m  , BMI Body mass index is 29.63 kg/m. GENERAL:  Well appearing HEENT:  Pupils equal round and reactive, fundi not visualized, oral mucosa unremarkable NECK:  No jugular venous distention, waveform within normal limits, carotid upstroke brisk and symmetric, no bruits, no thyromegaly LYMPHATICS:  No cervical, inguinal adenopathy LUNGS:  Clear to auscultation bilaterally BACK:  No CVA tenderness CHEST:  Unremarkable HEART:  PMI not displaced or sustained,S1 and S2 within normal limits, no S3, no S4, no clicks, no rubs, no murmurs ABD:  Flat, positive bowel sounds normal in frequency in pitch, no bruits, no rebound, no guarding, no midline pulsatile mass, no hepatomegaly, no splenomegaly EXT:  2 plus pulses throughout, no edema, no cyanosis no clubbing SKIN:  No rashes no nodules NEURO:  Cranial nerves II through XII grossly intact, motor grossly intact throughout PSYCH:  Cognitively intact, oriented to person place and time    EKG:  EKG is ordered today. The ekg ordered today demonstrates sinus rhythm, rate 60, axis within normal limits, intervals within normal limits, no acute ST-T wave changes.   Recent Labs: 08/23/2022: ALT 37; BUN 13; Creatinine, Ser 0.61; Potassium 4.5; Sodium 137    Lipid Panel    Component Value Date/Time   CHOL 266 (H) 08/23/2022 1015   TRIG 904 (HH) 08/23/2022 1015   HDL 26 (L) 08/23/2022 1015   CHOLHDL 10.2 (H) 08/23/2022 1015   LDLCALC Comment (A) 08/23/2022 1015      Wt Readings from Last 3 Encounters:  08/31/22 162 lb (73.5 kg)  08/23/22 162 lb 3.2 oz (73.6 kg)  04/13/22 163 lb (73.9 kg)      Other studies Reviewed: Additional studies/ records that were reviewed today include: Labs. Review of the above records demonstrates:  Please see elsewhere in the note.     ASSESSMENT AND PLAN:  Precordial chest pain: The chest discomfort has nonanginal greater than  anginal features.  He does have risk factors.  He had a little coronary calcium apparently on CT.  I am going to bring him back for a POET (Plain Old Exercise Treadmill).  If this test is negative I would suggest going back to Dr. Joya Gaskins and considering GI evaluation.  Hypertension: The blood pressure is at target.  No change in therapy.  Dyslipidemia: Triglycerides are elevated.  We had a conversation about carbohydrates and diabetes control.  DM: A1c was 8.5.  He has been off his medicines and restarted  these.  He will follow with his primary provider.  Aortic atherosclerosis: This going to be managed with primary risk reduction.  Current medicines are reviewed at length with the patient today.  The patient does not have concerns regarding medicines.  The following changes have been made:  no change  Labs/ tests ordered today include:   Orders Placed This Encounter  Procedures   EXERCISE TOLERANCE TEST (ETT)   EKG 12-Lead    Disposition:   FU with me as needed.     Signed, Minus Breeding, MD  08/31/2022 2:52 PM    [place]

## 2022-08-31 ENCOUNTER — Ambulatory Visit: Payer: Self-pay | Attending: Cardiology | Admitting: Cardiology

## 2022-08-31 ENCOUNTER — Encounter: Payer: Self-pay | Admitting: Cardiology

## 2022-08-31 ENCOUNTER — Other Ambulatory Visit: Payer: Self-pay

## 2022-08-31 VITALS — BP 128/74 | HR 60 | Ht 62.0 in | Wt 162.0 lb

## 2022-08-31 DIAGNOSIS — I10 Essential (primary) hypertension: Secondary | ICD-10-CM

## 2022-08-31 DIAGNOSIS — E785 Hyperlipidemia, unspecified: Secondary | ICD-10-CM

## 2022-08-31 DIAGNOSIS — R072 Precordial pain: Secondary | ICD-10-CM

## 2022-08-31 DIAGNOSIS — E118 Type 2 diabetes mellitus with unspecified complications: Secondary | ICD-10-CM

## 2022-08-31 NOTE — Patient Instructions (Signed)
Medication Instructions:  Your Physician recommend you continue on your current medication as directed.    *If you need a refill on your cardiac medications before your next appointment, please call your pharmacy*   Lab Work: None ordered today   Testing/Procedures: Your physician has requested that you have an exercise tolerance test. For further information please visit [email or web address]. Please also follow instruction sheet, as given.  [place] [address]   Follow-Up: At [facility], you and your health needs are our priority.  As part of our continuing mission to provide you with exceptional heart care, we have created designated Provider Care Teams.  These Care Teams include your primary Cardiologist (physician) and Advanced Practice Providers (APPs -  Physician Assistants and Nurse Practitioners) who all work together to provide you with the care you need, when you need it.  We recommend signing up for the patient portal called "MyChart".  Sign up information is provided on this After Visit Summary.  MyChart is used to connect with patients for Virtual Visits (Telemedicine).  Patients are able to view lab/test results, encounter notes, upcoming appointments, etc.  Non-urgent messages can be sent to your provider as well.   To learn more about what you can do with MyChart, go to [email or web address].    Your next appointment:   As needed  The format for your next appointment:   In Person  Provider:   None     You are scheduled for an Exercise Stress Test   Please arrive 15 minutes prior to your appointment time for registration and insurance purposes.  The test will take approximately 45 minutes to complete.  How to prepare for your Exercise Stress Test: Do bring a list of your current medications with you.  If not listed below, you may take your medications as normal. Do wear comfortable clothes (no dresses or overalls) and walking shoes, tennis  shoes preferred (no heels or open toed shoes are allowed) Do Not wear cologne, perfume, aftershave or lotions (deodorant is allowed).   If these instructions are not followed, your test will have to be rescheduled.  If you have questions or concerns about your appointment, you can call the Stress Lab at [phone].  If you cannot keep your appointment, please provide 24 hours notification to the Stress Lab, to avoid a possible $50 charge to your account

## 2022-09-04 ENCOUNTER — Encounter: Payer: Self-pay | Admitting: *Deleted

## 2022-09-14 ENCOUNTER — Ambulatory Visit: Payer: Self-pay | Attending: Cardiology

## 2022-09-14 DIAGNOSIS — R072 Precordial pain: Secondary | ICD-10-CM

## 2022-09-14 LAB — EXERCISE TOLERANCE TEST
Angina Index: 0
Duke Treadmill Score: 9
Estimated workload: 10.1
Exercise duration (min): 9 min
Exercise duration (sec): 0 s
MPHR: 157 {beats}/min
Peak HR: 142 {beats}/min
Percent HR: 90 %
RPE: 16
Rest HR: 64 {beats}/min
ST Depression (mm): 0 mm

## 2022-09-18 ENCOUNTER — Encounter: Payer: Self-pay | Admitting: *Deleted

## 2022-09-27 NOTE — Progress Notes (Unsigned)
S:     PCP: Dr. Joya Gaskins  61 y.o. male who presents for diabetes evaluation, education, and management. PMH is significant for HTN, GERD, T2DM, HLD.   Patient was referred and last seen by Primary Care Provider, Freeman Caldron, on 08/23/2022. At last visit, he reported that he had stopped taking his medications for about 3 months as he was traveling to [place] and did not have any of his medications. A1c was up to 8.5 from 6.7. Medications were restarted at that time and was instructed to follow up with CPP for HTN and DM management. Pharmacy has assisted in this patient's care before.   Today, patient arrives in good spirits and presents without any assistance. This visit was conducted with a Optometrist. He stated that he has been experiencing a psoriatic flare on his back and was hoping to utilize a steroid lotion for this. He currently uses clobetasol cream on his arm for his flares.   Family/Social History:  -Fhx: stroke, DM -Tobacco: former smoker  -Alcohol: denies current use  Current diabetes medications include: metformin 6644 mg daily, Trulicity 1.5 mg weekly (Fridays) Current hypertension medications include: amlodipine 10 mg daily, valsartan 160 mg daily  Current hyperlipidemia medications include: atorvastatin 10 mg daily, fenofibrate 145 mg daily   Patient reports adherence to taking all medications as prescribed.   Insurance coverage: none  Patient denies hypoglycemic events.  Reported home fasting blood sugars: 140-160   Patient denies nocturia (nighttime urination).  Patient denies neuropathy (nerve pain). Patient denies visual changes. Patient denies self foot exams.   Patient reported dietary habits: -vegetables, cut back on his beef intake since his last visit -drinks water and flavored waters   Patient-reported exercise habits:  - jogs and does 300 push ups daily    O:  Vitals:   09/28/22 1508  BP: 132/85  Pulse: 67    Lab Results  Component  Value Date   HGBA1C 8.5 (A) 08/23/2022   There were no vitals filed for this visit.  Lipid Panel     Component Value Date/Time   CHOL 266 (H) 08/23/2022 1015   TRIG 904 (HH) 08/23/2022 1015   HDL 26 (L) 08/23/2022 1015   CHOLHDL 10.2 (H) 08/23/2022 1015   LDLCALC Comment (A) 08/23/2022 1015    Clinical Atherosclerotic Cardiovascular Disease (ASCVD): No  The 10-year ASCVD risk score (Arnett DK, et al., 2019) is: 40%   Values used to calculate the score:     Age: 44 years     Sex: Male     Is Non-Hispanic African American: No     Diabetic: Yes     Tobacco smoker: No     Systolic Blood Pressure: 034 mmHg     Is BP treated: Yes     HDL Cholesterol: 26 mg/dL     Total Cholesterol: 266 mg/dL   A/P: Diabetes longstanding currently close to goal. Patient is able to verbalize appropriate hypoglycemia management plan. Medication adherence appears appropriate. Control is suboptimal due to 3 month lapse in medication adherence d/t the patient being out of the country. -Continued GLP-1 Trulicity 1.'5mg'$  once weekly -Started SGLT2-I Farxiga '5mg'$  once daily -Increased dose of metformin to '1000mg'$  BID -Patient educated on purpose, proper use, and potential adverse effects of [place].  -Extensively discussed pathophysiology of diabetes, recommended lifestyle interventions, dietary effects on blood sugar control.  -Counseled on s/sx of and management of hypoglycemia.  -Next A1c anticipated January.   ASCVD risk - primary prevention in patient  with diabetes.high intensity statin indicated.  -Discontinued atorvastatin 10 mg once daily.  -Initiated rosuvastatin '40mg'$  once daily  Hypertension longstanding currently controlled. Blood pressure goal of <130/80 mmHg. Medication adherence appropriate.  -Continued amlodipine '10mg'$  once daily -Continued valsartan '160mg'$  once daily  Written patient instructions provided. Patient verbalized understanding of treatment plan.  Total time in face to face  counseling 20 minutes.    Follow-up:  Pharmacist in one month. PCP clinic visit in 11/23/2022  Maryan Puls, PharmD PGY-1 [facility] Resident

## 2022-09-28 ENCOUNTER — Ambulatory Visit: Payer: Self-pay | Attending: Critical Care Medicine | Admitting: Pharmacist

## 2022-09-28 ENCOUNTER — Other Ambulatory Visit: Payer: Self-pay

## 2022-09-28 VITALS — BP 132/85 | HR 67

## 2022-09-28 DIAGNOSIS — E785 Hyperlipidemia, unspecified: Secondary | ICD-10-CM

## 2022-09-28 DIAGNOSIS — E1165 Type 2 diabetes mellitus with hyperglycemia: Secondary | ICD-10-CM

## 2022-09-28 MED ORDER — ROSUVASTATIN CALCIUM 40 MG PO TABS
40.0000 mg | ORAL_TABLET | Freq: Every day | ORAL | 2 refills | Status: DC
  Filled 2022-09-28: qty 30, 30d supply, fill #0

## 2022-09-28 MED ORDER — METFORMIN HCL 1000 MG PO TABS
1000.0000 mg | ORAL_TABLET | Freq: Two times a day (BID) | ORAL | 2 refills | Status: DC
  Filled 2022-09-28: qty 60, 30d supply, fill #0

## 2022-09-28 MED ORDER — DAPAGLIFLOZIN PROPANEDIOL 5 MG PO TABS
5.0000 mg | ORAL_TABLET | Freq: Every day | ORAL | 2 refills | Status: DC
  Filled 2022-09-28: qty 30, 30d supply, fill #0

## 2022-09-29 ENCOUNTER — Other Ambulatory Visit: Payer: Self-pay

## 2022-09-29 LAB — LIPID PANEL
Chol/HDL Ratio: 6.9 ratio — ABNORMAL HIGH (ref 0.0–5.0)
Cholesterol, Total: 213 mg/dL — ABNORMAL HIGH (ref 100–199)
HDL: 31 mg/dL — ABNORMAL LOW (ref >39)
LDL Chol Calc (NIH): 114 mg/dL — ABNORMAL HIGH (ref 0–99)
Triglycerides: 391 mg/dL — ABNORMAL HIGH (ref 0–149)
VLDL Cholesterol Cal: 68 mg/dL — ABNORMAL HIGH (ref 5–40)

## 2022-09-29 NOTE — Progress Notes (Signed)
Walter Phillips I think we need to stay on tricor and increase crestor to '80mg'$  /d can you f/u

## 2022-10-09 ENCOUNTER — Other Ambulatory Visit: Payer: Self-pay

## 2022-10-10 ENCOUNTER — Other Ambulatory Visit: Payer: Self-pay

## 2022-11-09 ENCOUNTER — Ambulatory Visit: Payer: Self-pay | Attending: Critical Care Medicine | Admitting: Pharmacist

## 2022-11-09 ENCOUNTER — Encounter: Payer: Self-pay | Admitting: Pharmacist

## 2022-11-09 DIAGNOSIS — E1165 Type 2 diabetes mellitus with hyperglycemia: Secondary | ICD-10-CM

## 2022-11-09 LAB — POCT GLYCOSYLATED HEMOGLOBIN (HGB A1C): HbA1c, POC (controlled diabetic range): 7.3 % — AB (ref 0.0–7.0)

## 2022-11-09 NOTE — Progress Notes (Signed)
    S:    PCP: Dr. Joya Gaskins   No chief complaint on file.  Patient arrives in good spirits. Presents for diabetes evaluation, education, and management. Patient was referred and last seen by Primary Care Provider on 08/23/2022. Allyson saw him on 09/28/2022 and she started [place]. We also increased his metformin dose.    Today, patient arrives in good spirits and does not have any complaints. Tells [place] that this occurred when he restarted medications in October but has improved over time.   Family/Social History:  -Fhx: stroke, DM -Tobacco: former smoker  -Alcohol: denies current use  Insurance coverage/medication affordability: self pay  Medication adherence reported. Current diabetes medications include: metformin 1000 mg BID, Farxiga 5 mg daily (not taking), Trulicity 1.5 mg weekly   Patient denies hypoglycemic events.  Patient reported dietary habits: -Admits to drinking [place]  -Reports that his diet is "better". Does not go into much detail but tells me he is limiting his carbs and does not eat any sweets.  -Denies drinking any fruit juices, soda, or other sugar-sweetened beverages   Patient-reported exercise habits:  - Tells me he exercises ~20 minutes every day.    Patient denies nocturia (nighttime urination).  Patient denies neuropathy (nerve pain). Patient denies visual changes. Patient reports self foot exams.    Reported: 126-146 mg/dL in the morning (some outliers in the 170s - attributes this to dietary indiscretion).  O:  Lab Results  Component Value Date   HGBA1C 7.3 (A) 11/09/2022   There were no vitals filed for this visit.  Lipid Panel     Component Value Date/Time   CHOL 213 (H) 09/28/2022 1526   TRIG 391 (H) 09/28/2022 1526   HDL 31 (L) 09/28/2022 1526   CHOLHDL 6.9 (H) 09/28/2022 1526   LDLCALC 114 (H) 09/28/2022 1526   Clinical Atherosclerotic Cardiovascular Disease (ASCVD): No  The 10-year ASCVD risk score (Arnett DK, et al., 2019)  is: 33%   Values used to calculate the score:     Age: 35 years     Sex: Male     Is Non-Hispanic African American: No     Diabetic: Yes     Tobacco smoker: No     Systolic Blood Pressure: 657 mmHg     Is BP treated: Yes     HDL Cholesterol: 31 mg/dL     Total Cholesterol: 213 mg/dL   A/P: Diabetes longstanding currently controlled based on A1c today. Patient is able to verbalize appropriate hypoglycemia management plan. Medication adherence appears appropriate. -Start Farxiga 5 mg daily.  -Continue Trulicity to 1.5 mg weekly.  -Continued metformin 1000 mg BID.  -Extensively discussed pathophysiology of diabetes, recommended lifestyle interventions, dietary effects on blood sugar control -Counseled on s/sx of and management of hypoglycemia -POCT A1c  Written patient instructions provided.  Total time in face to face counseling 20 minutes.   Follow up PCP Clinic Visit later this month. Told him to call in and request a visit if needed.   Benard Halsted, PharmD, [place], [place] [phone]

## 2022-11-23 ENCOUNTER — Encounter: Payer: Self-pay | Admitting: Critical Care Medicine

## 2022-11-23 ENCOUNTER — Other Ambulatory Visit: Payer: Self-pay

## 2022-11-23 ENCOUNTER — Ambulatory Visit: Payer: Self-pay | Attending: Critical Care Medicine | Admitting: Critical Care Medicine

## 2022-11-23 VITALS — BP 129/78 | HR 68 | Ht 62.0 in | Wt 157.2 lb

## 2022-11-23 DIAGNOSIS — I10 Essential (primary) hypertension: Secondary | ICD-10-CM

## 2022-11-23 DIAGNOSIS — Z1211 Encounter for screening for malignant neoplasm of colon: Secondary | ICD-10-CM

## 2022-11-23 DIAGNOSIS — H35033 Hypertensive retinopathy, bilateral: Secondary | ICD-10-CM

## 2022-11-23 DIAGNOSIS — Z23 Encounter for immunization: Secondary | ICD-10-CM

## 2022-11-23 DIAGNOSIS — E785 Hyperlipidemia, unspecified: Secondary | ICD-10-CM

## 2022-11-23 DIAGNOSIS — J8410 Pulmonary fibrosis, unspecified: Secondary | ICD-10-CM

## 2022-11-23 DIAGNOSIS — K219 Gastro-esophageal reflux disease without esophagitis: Secondary | ICD-10-CM

## 2022-11-23 DIAGNOSIS — E1165 Type 2 diabetes mellitus with hyperglycemia: Secondary | ICD-10-CM

## 2022-11-23 DIAGNOSIS — E118 Type 2 diabetes mellitus with unspecified complications: Secondary | ICD-10-CM

## 2022-11-23 MED ORDER — AMLODIPINE BESYLATE 10 MG PO TABS
10.0000 mg | ORAL_TABLET | Freq: Every day | ORAL | 1 refills | Status: DC
  Filled 2022-11-23: qty 90, 90d supply, fill #0

## 2022-11-23 MED ORDER — ROSUVASTATIN CALCIUM 40 MG PO TABS
40.0000 mg | ORAL_TABLET | Freq: Every day | ORAL | 2 refills | Status: DC
  Filled 2022-11-23: qty 30, 30d supply, fill #0

## 2022-11-23 MED ORDER — METFORMIN HCL 1000 MG PO TABS
1000.0000 mg | ORAL_TABLET | Freq: Two times a day (BID) | ORAL | 2 refills | Status: DC
  Filled 2022-11-23: qty 60, 30d supply, fill #0

## 2022-11-23 MED ORDER — FENOFIBRATE 145 MG PO TABS
145.0000 mg | ORAL_TABLET | Freq: Every day | ORAL | 1 refills | Status: DC
  Filled 2022-11-23: qty 60, 60d supply, fill #0

## 2022-11-23 MED ORDER — TRUEPLUS LANCETS 28G MISC
1 refills | Status: DC
  Filled 2022-11-23: qty 100, 50d supply, fill #0

## 2022-11-23 MED ORDER — OMEPRAZOLE 40 MG PO CPDR
40.0000 mg | DELAYED_RELEASE_CAPSULE | Freq: Every day | ORAL | 3 refills | Status: DC
  Filled 2022-11-23: qty 30, 30d supply, fill #0

## 2022-11-23 MED ORDER — TRUE METRIX BLOOD GLUCOSE TEST VI STRP
ORAL_STRIP | 12 refills | Status: DC
  Filled 2022-11-23: qty 100, 50d supply, fill #0
  Filled 2023-03-28: qty 100, 50d supply, fill #1

## 2022-11-23 MED ORDER — TRULICITY 1.5 MG/0.5ML ~~LOC~~ SOAJ
1.5000 mg | SUBCUTANEOUS | 3 refills | Status: DC
  Filled 2022-11-23: qty 2, 28d supply, fill #0

## 2022-11-23 MED ORDER — VALSARTAN 160 MG PO TABS
160.0000 mg | ORAL_TABLET | Freq: Every day | ORAL | 1 refills | Status: DC
  Filled 2022-11-23: qty 30, 30d supply, fill #0

## 2022-11-23 NOTE — Assessment & Plan Note (Signed)
Hypertension good control continue with amlodipine 10 mg daily valsartan 160 mg daily

## 2022-11-23 NOTE — Assessment & Plan Note (Signed)
Status post COVID-pneumonia this is improving gradually

## 2022-11-23 NOTE — Progress Notes (Signed)
Established Patient Office Visit  Subjective:  Patient ID: Walter Phillips, male    DOB: Jul 06, 1961  Age: 62 y.o. MRN: [id number]  CC:  Chief Complaint  Patient presents with   Hypertension    HPI 11/2021 Walter Phillips presents for 41-monthdiabetes follow-up blood sugars have been 116 and 96 he does have hypertriglyceridemia this needs to be repeated checked.  On arrival blood pressure 132/81.  This visit was assisted by Spanish interpreter JCory Roughen#[phone] Patient has no other complaints does need refills on medication  He is due an eye exam he states he will get this accomplished  Patient still has left knee pain he had this injected previously will refer to orthopedic surgery   6/15 this visit was assisted by video Spanish interpreter HMarina [phone] seen in return follow-up and he has had increasing blood sugars 150 160.  He has not been able to exercise as much and he ran out of his Trulicity and did not have it refilled after February.  Patient is also run out of his valsartan for 3 days.  On arrival blood pressure elevated 143/80  Patient is on the amlodipine 10 mg daily maintains the metformin  Patient's been eating more processed food as well.  He also eats a lot of plan [place] and pork and hashbrowns. Patient needs another colon cancer screening.  He did have a recent eye exam which showed mild retinopathy due to hypertension not due to diabetes, mild cataract dry eyes normal pressure repeat exam will be 1 year  From the standpoint of the patient's pulmonary fibrosis he is improved he says he is back to 85% of where he was before he was ill with COVID in 2020  Patient needs a urine microalbumin at this visit and a foot exam  11/23/22  Patient seen in return follow-up visit assisted by Spanish interpreter [name] [phone]  Patient states his breathing is under good control with no difficulty.He has been followed by clinical pharmacy and has had improvement in his  diabetes There are no other complaints The patient was having chest pain had a negative ECG stress test and has had no further pain since that time  Past Medical History:  Diagnosis Date   Chronic respiratory failure with hypoxia ([facility] [place] 10/15/2019   Diabetes mellitus, type II ([facility]    Dyslipidemia    Hypertension     Past Surgical History:  Procedure Laterality Date   APPENDECTOMY     LAPAROSCOPIC CHOLECYSTECTOMY     LAPAROSCOPIC SIGMOID COLECTOMY     VENTRAL HERNIA REPAIR  05/27/2014    Family History  Problem Relation Age of Onset   Early death Mother        childbirth   Cancer Father    Stomach cancer Sister [phone]  Stroke Sister    Diabetes Daughter     Social History   Socioeconomic History   Marital status: Married    Spouse name: Not on file   Number of children: Not on file   Years of education: Not on file   Highest education level: Not on file  Occupational History   Not on file  Tobacco Use   Smoking status: Former    Packs/day: 0.50    Types: Cigarettes   Smokeless tobacco: Never   Tobacco comments:    Few a cigarettes a week years ago.   Vaping Use   Vaping Use: Never used  Substance and Sexual Activity   Alcohol use: Not Currently  Comment: occ   Drug use: No   Sexual activity: Not on file  Other Topics Concern   Not on file  Social History Narrative   Lives with wife.    Social Determinants of Health   Financial Resource Strain: Not on file  Food Insecurity: Not on file  Transportation Needs: Not on file  Physical Activity: Not on file  Stress: Not on file  Social Connections: Not on file  Intimate Partner Violence: Not on file    Outpatient Medications Prior to Visit  Medication Sig Dispense Refill   Blood Glucose Monitoring Suppl (TRUE METRIX METER) w/Device KIT Use to measure blood sugar twice a day 1 kit 0   clobetasol cream (TEMOVATE) 3.15 % Apply 1 application topically to the affected area(s) 2 (two) times daily. 30 g 1    dapagliflozin propanediol (FARXIGA) 5 MG TABS tablet Take 1 tablet (5 mg total) by mouth daily before breakfast. 30 tablet 2   olopatadine (PATANOL) 0.1 % ophthalmic solution Place 1 drop into the left eye 2 (two) times daily. 5 mL 0   amLODipine (NORVASC) 10 MG tablet TAKE 1 TABLET (10 MG TOTAL) BY MOUTH DAILY. 90 tablet 1   aspirin EC 81 MG tablet Take 1 tablet (81 mg total) by mouth daily. Swallow whole. 30 tablet 12   Dulaglutide (TRULICITY) 1.5 QM/0.8QP SOPN Inject 1.5 mg into the skin once a week. 2 mL 3   fenofibrate (TRICOR) 145 MG tablet Take 1 tablet (145 mg total) by mouth once daily. 60 tablet 1   glucose blood (TRUE METRIX BLOOD GLUCOSE TEST) test strip Use as instructed 100 each 12   metFORMIN (GLUCOPHAGE) 1000 MG tablet Take 1 tablet (1,000 mg total) by mouth 2 (two) times daily with a meal. 60 tablet 2   omeprazole (PRILOSEC) 40 MG capsule TAKE 1 CAPSULE (40 MG TOTAL) BY MOUTH DAILY. 30 capsule 3   rosuvastatin (CRESTOR) 40 MG tablet Take 1 tablet (40 mg total) by mouth daily. 30 tablet 2   TRUEplus Lancets 28G MISC Use to measure blood sugar twice a day 100 each 1   valsartan (DIOVAN) 160 MG tablet TAKE 1 TABLET (160 MG TOTAL) BY MOUTH ONCE DAILY. 90 tablet 1   cyclobenzaprine (FLEXERIL) 10 MG tablet Take 1 tablet (10 mg total) by mouth 3 (three) times daily. (Patient not taking: Reported on 11/23/2022) 60 tablet 1   No facility-administered medications prior to visit.    No Known Allergies  ROS Review of Systems  Constitutional: Negative.   HENT: Negative.  Negative for ear pain, postnasal drip, rhinorrhea, sinus pressure, sore throat, trouble swallowing and voice change.   Eyes: Negative.   Respiratory: Negative.  Negative for apnea, cough, choking, chest tightness, shortness of breath, wheezing and stridor.   Cardiovascular: Negative.  Negative for chest pain, palpitations and leg swelling.  Gastrointestinal: Negative.  Negative for abdominal distention, abdominal pain,  nausea and vomiting.  Genitourinary: Negative.   Musculoskeletal: Negative.  Negative for arthralgias and myalgias.  Skin: Negative.  Negative for rash.  Allergic/Immunologic: Negative.  Negative for environmental allergies and food allergies.  Neurological: Negative.  Negative for dizziness, syncope, weakness and headaches.  Hematological: Negative.  Negative for adenopathy. Does not bruise/bleed easily.  Psychiatric/Behavioral: Negative.  Negative for agitation and sleep disturbance. The patient is not nervous/anxious.       Objective:    Physical Exam Vitals reviewed.  Constitutional:      Appearance: Normal appearance. He is well-developed. He is not  diaphoretic.  HENT:     Head: Normocephalic and atraumatic.     Nose: No nasal deformity, septal deviation, mucosal edema or rhinorrhea.     Right Sinus: No maxillary sinus tenderness or frontal sinus tenderness.     Left Sinus: No maxillary sinus tenderness or frontal sinus tenderness.     Mouth/Throat:     Pharynx: No oropharyngeal exudate.  Eyes:     General: No scleral icterus.    Conjunctiva/sclera: Conjunctivae normal.     Pupils: Pupils are equal, round, and reactive to light.  Neck:     Thyroid: No thyromegaly.     Vascular: No carotid bruit or JVD.     Trachea: Trachea normal. No tracheal tenderness or tracheal deviation.  Cardiovascular:     Rate and Rhythm: Normal rate and regular rhythm.     Chest Wall: PMI is not displaced.     Pulses: Normal pulses. No decreased pulses.     Heart sounds: Normal heart sounds, S1 normal and S2 normal. Heart sounds not distant. No murmur heard.    No systolic murmur is present.     No diastolic murmur is present.     No friction rub. No gallop. No S3 or S4 sounds.  Pulmonary:     Effort: No tachypnea, accessory muscle usage or respiratory distress.     Breath sounds: No stridor. No decreased breath sounds, wheezing, rhonchi or rales.  Chest:     Chest wall: No tenderness.   Abdominal:     General: Bowel sounds are normal. There is no distension.     Palpations: Abdomen is soft. Abdomen is not rigid.     Tenderness: There is no abdominal tenderness. There is no guarding or rebound.  Musculoskeletal:        General: Normal range of motion.     Cervical back: Normal range of motion and neck supple. No edema, erythema or rigidity. No muscular tenderness. Normal range of motion.     Comments: Normal foot exam except for mild onychomycosis both toenails of great toes and decrease sensation medial aspect posterior heel on foot left  Lymphadenopathy:     Head:     Right side of head: No submental or submandibular adenopathy.     Left side of head: No submental or submandibular adenopathy.     Cervical: No cervical adenopathy.  Skin:    General: Skin is warm and dry.     Coloration: Skin is not pale.     Findings: No rash.     Nails: There is no clubbing.  Neurological:     Mental Status: He is alert and oriented to person, place, and time.     Sensory: No sensory deficit.  Psychiatric:        Speech: Speech normal.        Behavior: Behavior normal.     BP 129/78   Pulse 68   Ht '5\' 2"'$  (1.575 m)   Wt 157 lb 3.2 oz (71.3 kg)   SpO2 97%   BMI 28.75 kg/m  Wt Readings from Last 3 Encounters:  11/23/22 157 lb 3.2 oz (71.3 kg)  08/31/22 162 lb (73.5 kg)  08/23/22 162 lb 3.2 oz (73.6 kg)     Health Maintenance Due  Topic Date Due   COLON CANCER SCREENING ANNUAL FOBT  02/09/2022    There are no preventive care reminders to display for this patient.  No results found for: "TSH" Lab Results  Component Value Date  WBC 5.1 05/19/2020   HGB 15.6 05/19/2020   HCT 47.1 05/19/2020   MCV 90 05/19/2020   PLT 199 05/19/2020   Lab Results  Component Value Date   NA 137 08/23/2022   K 4.5 08/23/2022   CO2 25 08/23/2022   GLUCOSE 195 (H) 08/23/2022   BUN 13 08/23/2022   CREATININE 0.61 (L) 08/23/2022   BILITOT 1.1 08/23/2022   ALKPHOS 148 (H)  08/23/2022   AST 26 08/23/2022   ALT 37 08/23/2022   PROT 7.9 08/23/2022   ALBUMIN 4.6 08/23/2022   CALCIUM 9.8 08/23/2022   ANIONGAP 10 11/07/2019   EGFR 108 08/23/2022   Lab Results  Component Value Date   CHOL 213 (H) 09/28/2022   Lab Results  Component Value Date   HDL 31 (L) 09/28/2022   Lab Results  Component Value Date   LDLCALC 114 (H) 09/28/2022   Lab Results  Component Value Date   TRIG 391 (H) 09/28/2022   Lab Results  Component Value Date   CHOLHDL 6.9 (H) 09/28/2022   Lab Results  Component Value Date   HGBA1C 7.3 (A) 11/09/2022      Assessment & Plan:   Problem List Items Addressed This Visit       Cardiovascular and Mediastinum   HTN (hypertension)    Hypertension good control continue with amlodipine 10 mg daily valsartan 160 mg daily      Relevant Medications   amLODipine (NORVASC) 10 MG tablet   fenofibrate (TRICOR) 145 MG tablet   rosuvastatin (CRESTOR) 40 MG tablet   valsartan (DIOVAN) 160 MG tablet     Respiratory   Postinflammatory pulmonary fibrosis (HCC)    Status post COVID-pneumonia this is improving gradually        Digestive   GERD (gastroesophageal reflux disease)    Reflux likely playing a role in the patient's chest pain continue omeprazole      Relevant Medications   omeprazole (PRILOSEC) 40 MG capsule     Endocrine   Type 2 diabetes mellitus with complication, without long-term current use of insulin (HCC)    Diabetes not quite yet at goal continue with [place] daily he is yet to start this continue with the metformin 1000 mg twice daily and continue Trulicity patient will follow-up with clinical pharmacy short-term      Relevant Medications   Dulaglutide (TRULICITY) 1.5 NU/2.7OZ SOPN   metFORMIN (GLUCOPHAGE) 1000 MG tablet   rosuvastatin (CRESTOR) 40 MG tablet   valsartan (DIOVAN) 160 MG tablet     Other   Dyslipidemia    Continue with statins      Relevant Medications   fenofibrate (TRICOR) 145 MG  tablet   rosuvastatin (CRESTOR) 40 MG tablet   Hypertensive retinopathy of both eyes, grade 1    Has had recent eye exam has follow-up      Other Visit Diagnoses     Colon cancer screening    -  Primary   Relevant Orders   Fecal occult blood, imunochemical   Uncontrolled type 2 diabetes mellitus with hyperglycemia (HCC)       Relevant Medications   Dulaglutide (TRULICITY) 1.5 DG/6.4QI SOPN   metFORMIN (GLUCOPHAGE) 1000 MG tablet   rosuvastatin (CRESTOR) 40 MG tablet   valsartan (DIOVAN) 160 MG tablet   Need for immunization against influenza       Relevant Orders   Flu Vaccine QUAD 70moIM (Fluarix, Fluzone & Alfiuria Quad PF) (Completed)      Meds ordered this encounter  Medications   amLODipine (NORVASC) 10 MG tablet    Sig: Take 1 tablet (10 mg total) by mouth daily.    Dispense:  90 tablet    Refill:  1   Dulaglutide (TRULICITY) 1.5 FF/6.3WG SOPN    Sig: Inject 1.5 mg into the skin once a week.    Dispense:  2 mL    Refill:  3   fenofibrate (TRICOR) 145 MG tablet    Sig: Take 1 tablet (145 mg total) by mouth once daily.    Dispense:  60 tablet    Refill:  1   glucose blood (TRUE METRIX BLOOD GLUCOSE TEST) test strip    Sig: Use as instructed    Dispense:  100 each    Refill:  12    twice daily per lancet instructions   metFORMIN (GLUCOPHAGE) 1000 MG tablet    Sig: Take 1 tablet (1,000 mg total) by mouth 2 (two) times daily with a meal.    Dispense:  60 tablet    Refill:  2   omeprazole (PRILOSEC) 40 MG capsule    Sig: Take 1 capsule (40 mg total) by mouth daily.    Dispense:  30 capsule    Refill:  3   rosuvastatin (CRESTOR) 40 MG tablet    Sig: Take 1 tablet (40 mg total) by mouth daily.    Dispense:  30 tablet    Refill:  2   valsartan (DIOVAN) 160 MG tablet    Sig: Take 1 tablet (160 mg total) by mouth daily.    Dispense:  90 tablet    Refill:  1   TRUEplus Lancets 28G MISC    Sig: Use to measure blood sugar twice a day    Dispense:  100 each     Refill:  1   38 minutes spent extra time needed because of language barrier  patient also needs to get colon cancer screening and patient did receive a flu vaccine  follow-up: Return in about 4 months (around 03/24/2023) for htn, diabetes.    Asencion Noble, MD

## 2022-11-23 NOTE — Assessment & Plan Note (Signed)
Reflux likely playing a role in the patient's chest pain continue omeprazole

## 2022-11-23 NOTE — Assessment & Plan Note (Signed)
Continue with statins.  ?

## 2022-11-23 NOTE — Patient Instructions (Addendum)
Refills on all medications sent to pharmacy downstairs please start the [place] as prescribed  Follow lifestyle medicine handout given with regards to diet that is the first page in the handout you need to be eating 3 meals a day mostly plant-based  The only labs we need today is for you to pick up a cancer screening kit processed that and bring it back so we can screen for colon cancer  Flu vaccine was given  Return to Dr. Joya Gaskins in 4 months and Lurena Joiner the pharmacist you saw last year in 6 weeks for your diabetes  Resurtidos de [employer] a la farmacia de [place], inicie [place] segn lo recetado.  Siga el folleto de medicina sobre el estilo de vida que se proporciona con respecto a la dieta, que se encuentra en la primera pgina del folleto. Debe realizar 3 comidas al da, principalmente a base de plantas.  Los nicos laboratorios que necesitamos hoy es que usted recoja un kit de deteccin de cncer procesado y lo traiga de vuelta para que podamos Public affairs consultant de colon.  Se puso la vacuna contra la gripe  Regrese con el Dr. Joya Gaskins en 4 meses y con Lurena Joiner, el farmacutico que vio el ao pasado, en 6 semanas por su diabetes.

## 2022-11-23 NOTE — Assessment & Plan Note (Signed)
Has had recent eye exam has follow-up

## 2022-11-23 NOTE — Assessment & Plan Note (Signed)
Diabetes not quite yet at goal continue with [place] daily he is yet to start this continue with the metformin 1000 mg twice daily and continue Trulicity patient will follow-up with clinical pharmacy short-term

## 2022-11-28 ENCOUNTER — Other Ambulatory Visit: Payer: Self-pay

## 2022-11-28 ENCOUNTER — Other Ambulatory Visit: Payer: Self-pay | Admitting: Critical Care Medicine

## 2022-11-28 NOTE — Telephone Encounter (Signed)
Requested medication (s) are due for refill today: review  Requested medication (s) are on the active medication list: no  Last refill:  10/13/21  Future visit scheduled: yes had OV 11/23/22  Notes to clinic:  original prescription was discontinued on 04/13/2022 by Walter Stain, MD for the following reason: Reorder.      Requested Prescriptions  Pending Prescriptions Disp Refills   tadalafil (CIALIS) 10 MG tablet 10 tablet 1    Sig: Take 1 tablet (10 mg total) by mouth every other day as needed for erectile dysfunction.     Urology: Erectile Dysfunction Agents Passed - 11/28/2022 10:48 AM      Passed - AST in normal range and within 360 days    AST  Date Value Ref Range Status  08/23/2022 26 0 - 40 IU/L Final         Passed - ALT in normal range and within 360 days    ALT  Date Value Ref Range Status  08/23/2022 37 0 - 44 IU/L Final         Passed - Last BP in normal range    BP Readings from Last 1 Encounters:  11/23/22 129/78         Passed - Valid encounter within last 12 months    Recent Outpatient Visits           5 days ago Type 2 diabetes mellitus with complication, without long-term current use of insulin [facility])   [place] Walter Stain, MD   2 weeks ago Controlled type 2 diabetes mellitus with hyperglycemia, unspecified whether long term insulin use [facility])   [place], [place] L, RPH-CPP   2 months ago Uncontrolled type 2 diabetes mellitus with hyperglycemia [facility])   [place], [place] L, RPH-CPP   3 months ago Controlled type 2 diabetes mellitus with hyperglycemia, unspecified whether long term insulin use [facility])   [place], [place], [place]   7 months ago Controlled type 2 diabetes mellitus without complication, without long-term current use of insulin [facility])   [place] Walter Stain, MD       Future Appointments             In 1 month Daisy Blossom, Jarome [place], [place]   In 4 months Joya Gaskins Burnett Harry, MD [place]

## 2022-11-29 ENCOUNTER — Other Ambulatory Visit: Payer: Self-pay

## 2022-11-29 MED ORDER — TADALAFIL 10 MG PO TABS
10.0000 mg | ORAL_TABLET | ORAL | 1 refills | Status: DC | PRN
  Filled 2022-11-29: qty 10, 30d supply, fill #0

## 2022-12-01 LAB — FECAL OCCULT BLOOD, IMMUNOCHEMICAL: Fecal Occult Bld: NEGATIVE

## 2022-12-02 NOTE — Progress Notes (Signed)
Let pt know fecal occult NEG no colon ca

## 2022-12-04 ENCOUNTER — Telehealth: Payer: Self-pay

## 2022-12-04 NOTE — Telephone Encounter (Signed)
-----   Message from Elsie Stain, MD sent at 12/02/2022  2:59 PM EST ----- Let pt know fecal occult NEG no colon ca

## 2022-12-04 NOTE — Telephone Encounter (Signed)
Pt was called and no vm was left due to mailbox being full. Information has been sent to nurse pool.  Interpreter [phone]

## 2022-12-06 ENCOUNTER — Other Ambulatory Visit: Payer: Self-pay

## 2022-12-27 NOTE — Progress Notes (Unsigned)
S:     PCP: Dr. Joya Gaskins   62 y.o. male who presents for diabetes evaluation, education, and management. PMH is significant for HTN, GERD, T2DM, HLD.    Patient was referred by Primary Care Provider, Freeman Caldron, on 08/23/2022.   At last visit with the pharmacy team on 11/09/2022, patient was instructed to start Wilder Glade that was prescribed at a previous visit. A1c was 7.3% at that time.   Today, patient arrives in good spirits and presents without any assistance. Visit is conducted with an interpreter. Patient reported some stomach pain, describing as what feels like an air bubble. He stated this has since resolved. He also reported his psoriatic lesions have worsened on his back, however, he ran out of his steroid cream.   Family/Social History:  -Fhx: stroke, DM -Tobacco: former smoker  -Alcohol: denies current use  Current diabetes medications include: metformin 1000 mg BID (only taking in the AM), Trulicity 1.5 mg weekly (Fridays), Farxiga 5 mg once daily (not taking regularly) Current hyperlipidemia medications include: Crestor 40 mg daily, fenofibrate 145 mg daily   Patient reports adherence with medications, however, he is only taking metformin once daily and stated he has 6 bottles of Farxiga at home, but was not sure if this was something he needed to take regularly as he is not currently.  Insurance coverage: none  Patient denies hypoglycemic events.  Reported home fasting blood sugars: 120-140s; reports a few recent readings in the 200s   Patient denies nocturia (nighttime urination).  Patient denies neuropathy (nerve pain). Patient denies visual changes. Patient reports self foot exams.   Patient reported dietary habits: -Admits to drinking [place]  -Reports that his diet is "better". Does not go into much detail but tells me he is limiting his carbs and does not eat any sweets.  -Denies drinking any fruit juices, soda, or other sugar-sweetened beverages     Patient-reported exercise habits:  - Tells me he exercises ~20 minutes every day.    O:   Lab Results  Component Value Date   HGBA1C 7.3 (A) 11/09/2022   There were no vitals filed for this visit.  Lipid Panel     Component Value Date/Time   CHOL 213 (H) 09/28/2022 1526   TRIG 391 (H) 09/28/2022 1526   HDL 31 (L) 09/28/2022 1526   CHOLHDL 6.9 (H) 09/28/2022 1526   LDLCALC 114 (H) 09/28/2022 1526    Clinical Atherosclerotic Cardiovascular Disease (ASCVD): No  The 10-year ASCVD risk score (Arnett DK, et al., 2019) is: 31.9%   Values used to calculate the score:     Age: 81 years     Sex: Male     Is Non-Hispanic African American: No     Diabetic: Yes     Tobacco smoker: No     Systolic Blood Pressure: [id number] mmHg     Is BP treated: Yes     HDL Cholesterol: 31 mg/dL     Total Cholesterol: 213 mg/dL    A/P: Diabetes longstanding currently uncontrolled. Patient is able to verbalize appropriate hypoglycemia management plan. Medication adherence appears suboptimal. Control is suboptimal due to non-adherence. -Continued GLP-1 Trulicity (dulaglutide) 1.5 mg once weekly  -Continued SGLT2-I Farxiga (dapagliflozin) 5 mg once daily. Instructed to take as directed  - Continued  metformin 1000 mg BID. Instructed to take as directed.  -Patient educated on purpose, proper use, and potential adverse effects of [place].  -Extensively discussed pathophysiology of diabetes, recommended lifestyle interventions, dietary effects on  blood sugar control.  -Counseled on s/sx of and management of hypoglycemia.  -Next A1c anticipated 02/2023.   ASCVD risk - primary prevention in patient with diabetes. Last LDL is 114 not at goal of <70 mg/dL. high intensity statin indicated.  -Continued rosuvastatin 40 mg once daily -F/u labs: lipid panel  Written patient instructions provided. Patient verbalized understanding of treatment plan.  Total time in face to face counseling 30 minutes.    Follow-up:   Pharmacist 4-6 weeks. PCP clinic visit in   Maryan Puls, PharmD PGY-1 [facility] Pharmacy Resident

## 2022-12-28 ENCOUNTER — Ambulatory Visit: Payer: Self-pay | Attending: Critical Care Medicine | Admitting: Pharmacist

## 2022-12-28 ENCOUNTER — Other Ambulatory Visit: Payer: Self-pay

## 2022-12-28 DIAGNOSIS — E785 Hyperlipidemia, unspecified: Secondary | ICD-10-CM

## 2022-12-28 DIAGNOSIS — E1165 Type 2 diabetes mellitus with hyperglycemia: Secondary | ICD-10-CM

## 2022-12-28 LAB — POCT GLYCOSYLATED HEMOGLOBIN (HGB A1C): HbA1c, POC (controlled diabetic range): 7.4 % — AB (ref 0.0–7.0)

## 2022-12-28 MED ORDER — TADALAFIL 10 MG PO TABS
10.0000 mg | ORAL_TABLET | ORAL | 1 refills | Status: DC | PRN
  Filled 2022-12-28: qty 10, 20d supply, fill #0
  Filled 2023-04-11: qty 10, 20d supply, fill #1

## 2022-12-28 MED ORDER — CLOBETASOL PROPIONATE 0.05 % EX CREA
TOPICAL_CREAM | Freq: Two times a day (BID) | CUTANEOUS | 1 refills | Status: DC
  Filled 2022-12-28: qty 30, 15d supply, fill #0

## 2022-12-29 LAB — LIPID PANEL
Chol/HDL Ratio: 6.8 ratio — ABNORMAL HIGH (ref 0.0–5.0)
Cholesterol, Total: 212 mg/dL — ABNORMAL HIGH (ref 100–199)
HDL: 31 mg/dL — ABNORMAL LOW (ref >39)
LDL Chol Calc (NIH): 108 mg/dL — ABNORMAL HIGH (ref 0–99)
Triglycerides: 425 mg/dL — ABNORMAL HIGH (ref 0–149)
VLDL Cholesterol Cal: 73 mg/dL — ABNORMAL HIGH (ref 5–40)

## 2022-12-29 NOTE — Progress Notes (Signed)
Walter Phillips this pt still too high LDL  he needs '80mg'$  crestor pls help and change RX and call pt

## 2023-01-01 ENCOUNTER — Other Ambulatory Visit: Payer: Self-pay

## 2023-01-01 ENCOUNTER — Other Ambulatory Visit: Payer: Self-pay | Admitting: Pharmacist

## 2023-01-01 MED ORDER — ATORVASTATIN CALCIUM 80 MG PO TABS
80.0000 mg | ORAL_TABLET | Freq: Every day | ORAL | 1 refills | Status: DC
  Filled 2023-01-01: qty 90, 90d supply, fill #0

## 2023-01-05 ENCOUNTER — Other Ambulatory Visit: Payer: Self-pay

## 2023-01-08 ENCOUNTER — Other Ambulatory Visit: Payer: Self-pay

## 2023-02-08 ENCOUNTER — Encounter: Payer: Self-pay | Admitting: Pharmacist

## 2023-02-08 ENCOUNTER — Other Ambulatory Visit: Payer: Self-pay

## 2023-02-08 ENCOUNTER — Ambulatory Visit: Payer: Self-pay | Attending: Critical Care Medicine | Admitting: Pharmacist

## 2023-02-08 DIAGNOSIS — E1165 Type 2 diabetes mellitus with hyperglycemia: Secondary | ICD-10-CM

## 2023-02-08 DIAGNOSIS — E118 Type 2 diabetes mellitus with unspecified complications: Secondary | ICD-10-CM

## 2023-02-08 MED ORDER — METFORMIN HCL 1000 MG PO TABS
1000.0000 mg | ORAL_TABLET | Freq: Two times a day (BID) | ORAL | 2 refills | Status: DC
  Filled 2023-02-08: qty 60, 30d supply, fill #0

## 2023-02-08 MED ORDER — TRULICITY 0.75 MG/0.5ML ~~LOC~~ SOAJ
0.7500 mg | SUBCUTANEOUS | 0 refills | Status: DC
  Filled 2023-02-08: qty 2, 28d supply, fill #0

## 2023-02-08 NOTE — Progress Notes (Addendum)
S:     PCP: Dr. Delford Field   62 y.o. male who presents for diabetes evaluation, education, and management. PMH is significant for HTN, GERD, T2DM, HLD. Patient was referred by Primary Care Provider, Dr. Delford Field, on 11/23/2022.  Last visit with the pharmacy team on 12/28/2022. No medication changes were made at that visit. Lipid panel collected on 12/28/2022 with LDL 108 and rosuvastatin 40 mg was changed to atorvastatin 80 mg daily.    Today, patient arrives in good spirits and presents without any assistance. Visit is conducted with an interpreter Marylene Land [phone]. He also reported his psoriatic lesions have worsened on his back, he has steroid cream but was requesting a liquid medication.   Family/Social History:  -Fhx: stroke, DM -Tobacco: former smoker  -Alcohol: denies current use  Current diabetes medications include: metformin 1000 mg BID (only taking in the AM), Trulicity 1.5 mg weekly (Fridays) (not taking - ran out of medication), Farxiga 5 mg once daily  Current hyperlipidemia medications include: atorvastatin 80 mg daily, fenofibrate 145 mg daily   Patient reports adherence with medications, however, he is only taking metformin once a day and ran out of Trulicity. Last dose of Trulicity about 3 weeks ago.   Insurance coverage: none  Patient denies hypoglycemic events.   Reported home fasting blood sugars: 140-150s.  Patient denies nocturia (nighttime urination).  Patient denies neuropathy (nerve pain). Patient denies visual changes. Patient reports self foot exams.   Patient reported dietary habits: Reports no changes since last seen -Admits to drinking [place]  -Reports that his diet is "better". Does not go into much detail but tells me he is limiting his carbs and does not eat any sweets.  -Denies drinking any fruit juices, soda, or other sugar-sweetened beverages    Patient-reported exercise habits:  - Tells me he exercises ~20 minutes every day.   O:   Lab  Results  Component Value Date   HGBA1C 7.4 (A) 12/28/2022   There were no vitals filed for this visit.  Lipid Panel     Component Value Date/Time   CHOL 212 (H) 12/28/2022 1049   TRIG 425 (H) 12/28/2022 1049   HDL 31 (L) 12/28/2022 1049   CHOLHDL 6.8 (H) 12/28/2022 1049   LDLCALC 108 (H) 12/28/2022 1049    Clinical Atherosclerotic Cardiovascular Disease (ASCVD): No  The 10-year ASCVD risk score (Arnett DK, et al., 2019) is: 31.8%   Values used to calculate the score:     Age: 22 years     Sex: Male     Is Non-Hispanic African American: No     Diabetic: Yes     Tobacco smoker: No     Systolic Blood Pressure: 129 mmHg     Is BP treated: Yes     HDL Cholesterol: 31 mg/dL     Total Cholesterol: 212 mg/dL    A/P: Diabetes longstanding currently uncontrolled. Patient is able to verbalize appropriate hypoglycemia management plan. Medication adherence appears suboptimal. Control is suboptimal due to non-adherence. -Restart GLP-1 Trulicity (dulaglutide) 0.75 mg once weekly  -Continued SGLT2-I Farxiga (dapagliflozin) 5 mg once daily. Instructed to take as directed  - Continued  metformin 1000 mg BID. Instructed to take as directed.  -If continued to take metformin daily instead to BID, could consider switching to metformin XR at next visit.  -Extensively discussed pathophysiology of diabetes, recommended lifestyle interventions, dietary effects on blood sugar control.  -Counseled on s/sx of and management of hypoglycemia.  -Next A1c anticipated  02/2023.   ASCVD risk - primary prevention in patient with diabetes. Last LDL is 108 not at goal of <13 mg/dL. high intensity statin indicated.  -Continued atorvastatin 80 mg   Psoriasis -Encouraged patient to discuss with PCP at upcoming appointment.   Written patient instructions provided. Patient verbalized understanding of treatment plan.  Total time in face to face counseling 30 minutes.    Follow-up:  Pharmacist 4 weeks. PCP  clinic visit on 03/28/2023  Georga Hacking, PharmD PGY-1 Pharmacy Resident

## 2023-03-16 ENCOUNTER — Other Ambulatory Visit: Payer: Self-pay

## 2023-03-16 ENCOUNTER — Ambulatory Visit: Payer: Self-pay | Attending: Critical Care Medicine | Admitting: Pharmacist

## 2023-03-16 ENCOUNTER — Encounter: Payer: Self-pay | Admitting: Pharmacist

## 2023-03-16 DIAGNOSIS — Z7985 Long-term (current) use of injectable non-insulin antidiabetic drugs: Secondary | ICD-10-CM

## 2023-03-16 DIAGNOSIS — E118 Type 2 diabetes mellitus with unspecified complications: Secondary | ICD-10-CM

## 2023-03-16 DIAGNOSIS — E1165 Type 2 diabetes mellitus with hyperglycemia: Secondary | ICD-10-CM

## 2023-03-16 DIAGNOSIS — Z7984 Long term (current) use of oral hypoglycemic drugs: Secondary | ICD-10-CM

## 2023-03-16 LAB — POCT GLYCOSYLATED HEMOGLOBIN (HGB A1C): HbA1c, POC (controlled diabetic range): 6.9 % (ref 0.0–7.0)

## 2023-03-16 MED ORDER — TRULICITY 1.5 MG/0.5ML ~~LOC~~ SOAJ
1.5000 mg | SUBCUTANEOUS | 1 refills | Status: DC
  Filled 2023-03-16 – 2023-04-05 (×2): qty 2, 28d supply, fill #0
  Filled 2023-05-17: qty 2, 28d supply, fill #1

## 2023-03-16 MED ORDER — DAPAGLIFLOZIN PROPANEDIOL 5 MG PO TABS
5.0000 mg | ORAL_TABLET | Freq: Every day | ORAL | 1 refills | Status: DC
  Filled 2023-03-16: qty 90, 90d supply, fill #0

## 2023-03-16 NOTE — Progress Notes (Signed)
    S:     PCP: Dr. Delford Field   62 y.o. male who presents for diabetes evaluation, education, and management. PMH is significant for HTN, GERD, T2DM, HLD. Patient was referred by Primary Care Provider, Dr. Delford Field, on 11/23/2022. Pharmacy has seen him several times since. We had to restart his Trulicity at last visit because he had been without for ~3 weeks.   Today, patient arrives in good spirits and presents without any assistance. Visit is conducted with an interpreter Marylene Land [phone]. He has no complaints today.   Family/Social History:  -Fhx: stroke, DM -Tobacco: former smoker  -Alcohol: denies current use  Current diabetes medications include: metformin 1000 mg BID,Trulicity 0.75mg  weekly, Farxiga 5 mg once daily  Current hyperlipidemia medications include: atorvastatin 80 mg daily, fenofibrate 145 mg daily  Current antihypertensive medications include: amlodipine 10 mg daily, valsartan 160 mg daily   Patient reports adherence with medications.  Insurance coverage: none  Patient denies hypoglycemic events.   Reported home fasting blood sugars: 140-150s.  Patient denies nocturia (nighttime urination).  Patient denies neuropathy (nerve pain). Patient denies visual changes. Patient reports self foot exams.   Patient reported dietary habits: Reports no changes since last seen -Admits to drinking [place]  -Reports that his diet is "better". Does not go into much detail but tells me he is limiting his carbs and does not eat any sweets.  -Denies drinking any fruit juices, soda, or other sugar-sweetened beverages    Patient-reported exercise habits:  - Tells me he exercises ~20 minutes every day.   O:   Lab Results  Component Value Date   HGBA1C 6.9 03/16/2023   There were no vitals filed for this visit.  Lipid Panel     Component Value Date/Time   CHOL 212 (H) 12/28/2022 1049   TRIG 425 (H) 12/28/2022 1049   HDL 31 (L) 12/28/2022 1049   CHOLHDL 6.8 (H) 12/28/2022  1049   LDLCALC 108 (H) 12/28/2022 1049    Clinical Atherosclerotic Cardiovascular Disease (ASCVD): No  The 10-year ASCVD risk score (Arnett DK, et al., 2019) is: 31.8%   Values used to calculate the score:     Age: 49 years     Sex: Male     Is Non-Hispanic African American: No     Diabetic: Yes     Tobacco smoker: No     Systolic Blood Pressure: 129 mmHg     Is BP treated: Yes     HDL Cholesterol: 31 mg/dL     Total Cholesterol: 212 mg/dL    A/P: Diabetes longstanding currently at goal. Commended patient for this! Patient is able to verbalize appropriate hypoglycemia management plan. Medication adherence appears optimal.  -Increase dose of GLP-1 Trulicity (dulaglutide) back up to 1.5 mg once weekly  -Continued SGLT2-I Farxiga (dapagliflozin) 5 mg once daily. Instructed to take as directed  - Continued  metformin 1000 mg BID. Instructed to take as directed.  -Extensively discussed pathophysiology of diabetes, recommended lifestyle interventions, dietary effects on blood sugar control.  -Counseled on s/sx of and management of hypoglycemia.  -Next A1c anticipated 8/24.   Written patient instructions provided. Patient verbalized understanding of treatment plan.  Total time in face to face counseling 30 minutes.    Follow-up:  Pharmacist prn. PCP clinic visit on 03/28/2023  Butch Penny, PharmD, BCACP, CPP Clinical Pharmacist [facility] [place] [phone]

## 2023-03-22 ENCOUNTER — Other Ambulatory Visit: Payer: Self-pay

## 2023-03-28 ENCOUNTER — Encounter: Payer: Self-pay | Admitting: Critical Care Medicine

## 2023-03-28 ENCOUNTER — Other Ambulatory Visit: Payer: Self-pay

## 2023-03-28 ENCOUNTER — Ambulatory Visit: Payer: Self-pay | Attending: Critical Care Medicine | Admitting: Critical Care Medicine

## 2023-03-28 VITALS — BP 131/75 | HR 63 | Temp 98.0°F | Ht 62.0 in | Wt 157.0 lb

## 2023-03-28 DIAGNOSIS — E118 Type 2 diabetes mellitus with unspecified complications: Secondary | ICD-10-CM

## 2023-03-28 DIAGNOSIS — I10 Essential (primary) hypertension: Secondary | ICD-10-CM

## 2023-03-28 DIAGNOSIS — E1165 Type 2 diabetes mellitus with hyperglycemia: Secondary | ICD-10-CM

## 2023-03-28 DIAGNOSIS — E781 Pure hyperglyceridemia: Secondary | ICD-10-CM

## 2023-03-28 DIAGNOSIS — L409 Psoriasis, unspecified: Secondary | ICD-10-CM

## 2023-03-28 DIAGNOSIS — Z7984 Long term (current) use of oral hypoglycemic drugs: Secondary | ICD-10-CM

## 2023-03-28 LAB — GLUCOSE, POCT (MANUAL RESULT ENTRY): POC Glucose: 191 mg/dL — AB (ref 70–99)

## 2023-03-28 MED ORDER — CLOBETASOL PROPIONATE 0.05 % EX CREA
TOPICAL_CREAM | Freq: Two times a day (BID) | CUTANEOUS | 1 refills | Status: DC
  Filled 2023-03-28: qty 30, 15d supply, fill #0

## 2023-03-28 MED ORDER — AMLODIPINE BESYLATE 10 MG PO TABS
10.0000 mg | ORAL_TABLET | Freq: Every day | ORAL | 1 refills | Status: DC
  Filled 2023-03-28: qty 90, 90d supply, fill #0
  Filled 2023-07-19: qty 90, 90d supply, fill #1

## 2023-03-28 MED ORDER — METFORMIN HCL 1000 MG PO TABS
1000.0000 mg | ORAL_TABLET | Freq: Two times a day (BID) | ORAL | 2 refills | Status: DC
Start: 2023-03-28 — End: 2023-10-09
  Filled 2023-03-28: qty 60, 30d supply, fill #0
  Filled 2023-05-17 – 2023-07-19 (×2): qty 60, 30d supply, fill #1

## 2023-03-28 MED ORDER — ATORVASTATIN CALCIUM 80 MG PO TABS
80.0000 mg | ORAL_TABLET | Freq: Every day | ORAL | 1 refills | Status: DC
  Filled 2023-03-28 – 2023-04-05 (×2): qty 90, 90d supply, fill #0
  Filled 2023-07-19: qty 90, 90d supply, fill #1

## 2023-03-28 MED ORDER — TRUEPLUS LANCETS 28G MISC
1 refills | Status: DC
  Filled 2023-03-28: qty 100, 50d supply, fill #0

## 2023-03-28 MED ORDER — VALSARTAN 160 MG PO TABS
160.0000 mg | ORAL_TABLET | Freq: Every day | ORAL | 1 refills | Status: DC
  Filled 2023-03-28: qty 90, 90d supply, fill #0
  Filled 2023-07-19: qty 30, 30d supply, fill #1

## 2023-03-28 MED ORDER — FENOFIBRATE 145 MG PO TABS
145.0000 mg | ORAL_TABLET | Freq: Every day | ORAL | 1 refills | Status: DC
  Filled 2023-03-28: qty 60, 60d supply, fill #0

## 2023-03-28 NOTE — Assessment & Plan Note (Signed)
Blood pressure well controlled no change in medicines 

## 2023-03-28 NOTE — Progress Notes (Signed)
Established Patient Office Visit  Subjective:  Patient ID: Walter Phillips, male    DOB: Feb 03, 1961  Age: 62 y.o. MRN: [id number]  CC:  Chief Complaint  Patient presents with   Hypertension    HTN & DM f/u.  Concerns about spots on lower back X15 yrs    HPI 11/2021 Walter Phillips presents for 67-month diabetes follow-up blood sugars have been 116 and 96 he does have hypertriglyceridemia this needs to be repeated checked.  On arrival blood pressure 132/81.  This visit was assisted by Spanish interpreter [name] [phone]  Patient has no other complaints does need refills on medication  He is due an eye exam he states he will get this accomplished  Patient still has left knee pain he had this injected previously will refer to orthopedic surgery   6/15 this visit was assisted by video Spanish interpreter [name] [phone] Patient seen in return follow-up and he has had increasing blood sugars 150 160.  He has not been able to exercise as much and he ran out of his Trulicity and did not have it refilled after February.  Patient is also run out of his valsartan for 3 days.  On arrival blood pressure elevated 143/80  Patient is on the amlodipine 10 mg daily maintains the metformin  Patient's been eating more processed food as well.  He also eats a lot of plan Taine and pork and hashbrowns. Patient needs another colon cancer screening.  He did have a recent eye exam which showed mild retinopathy due to hypertension not due to diabetes, mild cataract dry eyes normal pressure repeat exam will be 1 year  From the standpoint of the patient's pulmonary fibrosis he is improved he says he is back to 85% of where he was before he was ill with COVID in 2020  Patient needs a urine microalbumin at this visit and a foot exam  11/23/22  Patient seen in return follow-up visit assisted by Spanish interpreter [name] [phone].  Patient states his breathing is under good control with no difficulty.He has been followed  by clinical pharmacy and has had improvement in his diabetes There are no other complaints The patient was having chest pain had a negative ECG stress test and has had no further pain since that time  5/28 Patient seen in return follow-up and complains of rash on his back.  The patient is taking the Trulicity and [place] and metformin.  He saw our clinical pharmacist who made these changes.  Blood sugar today is 191 but at home he stays in the 120s to 140s.  He did not eat this morning.   Past Medical History:  Diagnosis Date   Chronic respiratory failure with hypoxia (HCC) 10/15/2019   Diabetes mellitus, type II (HCC)    Dyslipidemia    Hypertension     Past Surgical History:  Procedure Laterality Date   APPENDECTOMY     LAPAROSCOPIC CHOLECYSTECTOMY     LAPAROSCOPIC SIGMOID COLECTOMY     VENTRAL HERNIA REPAIR  05/27/2014    Family History  Problem Relation Age of Onset   Early death Mother        childbirth   Cancer Father    Stomach cancer Sister 52   Stroke Sister    Diabetes Daughter     Social History   Socioeconomic History   Marital status: Married    Spouse name: Not on file   Number of children: Not on file   Years of education: Not on  file   Highest education level: Not on file  Occupational History   Not on file  Tobacco Use   Smoking status: Former    Packs/day: .5    Types: Cigarettes   Smokeless tobacco: Never   Tobacco comments:    Few a cigarettes a week years ago.   Vaping Use   Vaping Use: Never used  Substance and Sexual Activity   Alcohol use: Not Currently    Comment: occ   Drug use: No   Sexual activity: Not on file  Other Topics Concern   Not on file  Social History Narrative   Lives with wife.    Social Determinants of Health   Financial Resource Strain: Low Risk  (02/08/2023)   Overall Financial Resource Strain (CARDIA)    Difficulty of Paying Living Expenses: Not very hard  Food Insecurity: No Food Insecurity (02/08/2023)    Hunger Vital Sign    Worried About Running Out of Food in the Last Year: Never true    Ran Out of Food in the Last Year: Never true  Transportation Needs: No Transportation Needs (02/08/2023)   PRAPARE - Administrator, Civil Service (Medical): No    Lack of Transportation (Non-Medical): No  Physical Activity: Inactive (02/08/2023)   Exercise Vital Sign    Days of Exercise per Week: 0 days    Minutes of Exercise per Session: 0 min  Stress: No Stress Concern Present (02/08/2023)   [employer] of Occupational Health - Occupational Stress Questionnaire    Feeling of Stress : Not at all  Social Connections: Socially Integrated (02/08/2023)   Social Connection and Isolation Panel [NHANES]    Frequency of Communication with Friends and Family: More than three times a week    Frequency of Social Gatherings with Friends and Family: More than three times a week    Attends Religious Services: 1 to 4 times per year    Active Member of [employer] or Organizations: Yes    Attends Banker Meetings: 1 to 4 times per year    Marital Status: Married  Catering manager Violence: Not At Risk (02/08/2023)   Humiliation, Afraid, Rape, and Kick questionnaire    Fear of Current or Ex-Partner: No    Emotionally Abused: No    Physically Abused: No    Sexually Abused: No    Outpatient Medications Prior to Visit  Medication Sig Dispense Refill   Blood Glucose Monitoring Suppl (TRUE METRIX METER) w/Device KIT Use to measure blood sugar twice a day 1 kit 0   dapagliflozin propanediol (FARXIGA) 5 MG TABS tablet Take 1 tablet (5 mg total) by mouth daily before breakfast. 90 tablet 1   Dulaglutide (TRULICITY) 1.5 MG/0.5ML SOPN Inject 1.5 mg into the skin once a week. 6 mL 1   glucose blood (TRUE METRIX BLOOD GLUCOSE TEST) test strip Use as instructed 100 each 12   olopatadine (PATANOL) 0.1 % ophthalmic solution Place 1 drop into the left eye 2 (two) times daily. 5 mL 0   omeprazole (PRILOSEC)  40 MG capsule Take 1 capsule (40 mg total) by mouth daily. 30 capsule 3   tadalafil (CIALIS) 10 MG tablet Take 1 tablet (10 mg total) by mouth every other day as needed for erectile dysfunction. 10 tablet 1   amLODipine (NORVASC) 10 MG tablet Take 1 tablet (10 mg total) by mouth daily. 90 tablet 1   atorvastatin (LIPITOR) 80 MG tablet Take 1 tablet (80 mg total) by mouth daily.  90 tablet 1   clobetasol cream (TEMOVATE) 0.05 % Apply 1 application topically to the affected area(s) 2 (two) times daily. 30 g 1   fenofibrate (TRICOR) 145 MG tablet Take 1 tablet (145 mg total) by mouth once daily. 60 tablet 1   metFORMIN (GLUCOPHAGE) 1000 MG tablet Take 1 tablet (1,000 mg total) by mouth 2 (two) times daily with a meal. 60 tablet 2   TRUEplus Lancets 28G MISC Use to measure blood sugar twice a day 100 each 1   valsartan (DIOVAN) 160 MG tablet Take 1 tablet (160 mg total) by mouth daily. 90 tablet 1   No facility-administered medications prior to visit.    No Known Allergies  ROS Review of Systems  Constitutional: Negative.   HENT: Negative.  Negative for ear pain, postnasal drip, rhinorrhea, sinus pressure, sore throat, trouble swallowing and voice change.   Eyes: Negative.   Respiratory: Negative.  Negative for apnea, cough, choking, chest tightness, shortness of breath, wheezing and stridor.   Cardiovascular: Negative.  Negative for chest pain, palpitations and leg swelling.  Gastrointestinal: Negative.  Negative for abdominal distention, abdominal pain, nausea and vomiting.  Genitourinary: Negative.   Musculoskeletal: Negative.  Negative for arthralgias and myalgias.  Skin:  Positive for rash.  Allergic/Immunologic: Negative.  Negative for environmental allergies and food allergies.  Neurological: Negative.  Negative for dizziness, syncope, weakness and headaches.  Hematological: Negative.  Negative for adenopathy. Does not bruise/bleed easily.  Psychiatric/Behavioral: Negative.  Negative  for agitation and sleep disturbance. The patient is not nervous/anxious.       Objective:    Physical Exam Vitals reviewed.  Constitutional:      Appearance: Normal appearance. He is well-developed. He is not diaphoretic.  HENT:     Head: Normocephalic and atraumatic.     Nose: No nasal deformity, septal deviation, mucosal edema or rhinorrhea.     Right Sinus: No maxillary sinus tenderness or frontal sinus tenderness.     Left Sinus: No maxillary sinus tenderness or frontal sinus tenderness.     Mouth/Throat:     Pharynx: No oropharyngeal exudate.  Eyes:     General: No scleral icterus.    Conjunctiva/sclera: Conjunctivae normal.     Pupils: Pupils are equal, round, and reactive to light.  Neck:     Thyroid: No thyromegaly.     Vascular: No carotid bruit or JVD.     Trachea: Trachea normal. No tracheal tenderness or tracheal deviation.  Cardiovascular:     Rate and Rhythm: Normal rate and regular rhythm.     Chest Wall: PMI is not displaced.     Pulses: Normal pulses. No decreased pulses.     Heart sounds: Normal heart sounds, S1 normal and S2 normal. Heart sounds not distant. No murmur heard.    No systolic murmur is present.     No diastolic murmur is present.     No friction rub. No gallop. No S3 or S4 sounds.  Pulmonary:     Effort: No tachypnea, accessory muscle usage or respiratory distress.     Breath sounds: No stridor. No decreased breath sounds, wheezing, rhonchi or rales.  Chest:     Chest wall: No tenderness.  Abdominal:     General: Bowel sounds are normal. There is no distension.     Palpations: Abdomen is soft. Abdomen is not rigid.     Tenderness: There is no abdominal tenderness. There is no guarding or rebound.  Musculoskeletal:  General: Normal range of motion.     Cervical back: Normal range of motion and neck supple. No edema, erythema or rigidity. No muscular tenderness. Normal range of motion.     Comments: Normal foot exam except for mild  onychomycosis both toenails of great toes and decrease sensation medial aspect posterior heel on foot left  Lymphadenopathy:     Head:     Right side of head: No submental or submandibular adenopathy.     Left side of head: No submental or submandibular adenopathy.     Cervical: No cervical adenopathy.  Skin:    General: Skin is warm and dry.     Coloration: Skin is not pale.     Findings: Rash present.     Nails: There is no clubbing.     Comments: Rash on the back arms and legs compatible with psoriasis  Neurological:     Mental Status: He is alert and oriented to person, place, and time.     Sensory: No sensory deficit.  Psychiatric:        Speech: Speech normal.        Behavior: Behavior normal.     BP 131/75 (BP Location: Left Arm, Patient Position: Sitting, Cuff Size: Normal)   Pulse 63   Temp 98 F (36.7 C) (Oral)   Ht 5\' 2"  (1.575 m)   Wt 157 lb (71.2 kg)   SpO2 96%   BMI 28.72 kg/m  Wt Readings from Last 3 Encounters:  03/28/23 157 lb (71.2 kg)  11/23/22 157 lb 3.2 oz (71.3 kg)  08/31/22 162 lb (73.5 kg)     Health Maintenance Due  Topic Date Due   Diabetic kidney evaluation - Urine ACR  04/14/2023    There are no preventive care reminders to display for this patient.  No results found for: "TSH" Lab Results  Component Value Date   WBC 5.1 05/19/2020   HGB 15.6 05/19/2020   HCT 47.1 05/19/2020   MCV 90 05/19/2020   PLT 199 05/19/2020   Lab Results  Component Value Date   NA 137 08/23/2022   K 4.5 08/23/2022   CO2 25 08/23/2022   GLUCOSE 195 (H) 08/23/2022   BUN 13 08/23/2022   CREATININE 0.61 (L) 08/23/2022   BILITOT 1.1 08/23/2022   ALKPHOS 148 (H) 08/23/2022   AST 26 08/23/2022   ALT 37 08/23/2022   PROT 7.9 08/23/2022   ALBUMIN 4.6 08/23/2022   CALCIUM 9.8 08/23/2022   ANIONGAP 10 11/07/2019   EGFR 108 08/23/2022   Lab Results  Component Value Date   CHOL 212 (H) 12/28/2022   Lab Results  Component Value Date   HDL 31 (L)  12/28/2022   Lab Results  Component Value Date   LDLCALC 108 (H) 12/28/2022   Lab Results  Component Value Date   TRIG 425 (H) 12/28/2022   Lab Results  Component Value Date   CHOLHDL 6.8 (H) 12/28/2022   Lab Results  Component Value Date   HGBA1C 6.9 03/16/2023      Assessment & Plan:   Problem List Items Addressed This Visit       Cardiovascular and Mediastinum   HTN (hypertension)    Blood pressure well-controlled no change in medicines      Relevant Medications   amLODipine (NORVASC) 10 MG tablet   atorvastatin (LIPITOR) 80 MG tablet   fenofibrate (TRICOR) 145 MG tablet   valsartan (DIOVAN) 160 MG tablet     Endocrine   Type  2 diabetes mellitus with complication, without long-term current use of insulin (HCC) - Primary    Diabetes appears to be under good control no change in medications      Relevant Medications   atorvastatin (LIPITOR) 80 MG tablet   metFORMIN (GLUCOPHAGE) 1000 MG tablet   valsartan (DIOVAN) 160 MG tablet   Other Relevant Orders   POCT glucose (manual entry) (Completed)   Microalbumin / creatinine urine ratio     Musculoskeletal and Integument   Psoriasis    Involving the back arms and legs  Will represcribe topical steroids and see if we can get this patient the orange card and send him to dermatology for potential molecular biological therapy such as Humira        Other   Hypertriglyceridemia    Continue cholesterol management      Relevant Medications   amLODipine (NORVASC) 10 MG tablet   atorvastatin (LIPITOR) 80 MG tablet   fenofibrate (TRICOR) 145 MG tablet   valsartan (DIOVAN) 160 MG tablet   Other Visit Diagnoses     Uncontrolled type 2 diabetes mellitus with hyperglycemia (HCC)       Relevant Medications   atorvastatin (LIPITOR) 80 MG tablet   metFORMIN (GLUCOPHAGE) 1000 MG tablet   valsartan (DIOVAN) 160 MG tablet      Meds ordered this encounter  Medications   amLODipine (NORVASC) 10 MG tablet    Sig:  Take 1 tablet (10 mg total) by mouth daily.    Dispense:  90 tablet    Refill:  1   atorvastatin (LIPITOR) 80 MG tablet    Sig: Take 1 tablet (80 mg total) by mouth daily.    Dispense:  90 tablet    Refill:  1    Stop rosuvastatin   clobetasol cream (TEMOVATE) 0.05 %    Sig: Apply 1 application topically to the affected area(s) 2 (two) times daily.    Dispense:  30 g    Refill:  1   fenofibrate (TRICOR) 145 MG tablet    Sig: Take 1 tablet (145 mg total) by mouth once daily.    Dispense:  60 tablet    Refill:  1   metFORMIN (GLUCOPHAGE) 1000 MG tablet    Sig: Take 1 tablet (1,000 mg total) by mouth 2 (two) times daily with a meal.    Dispense:  60 tablet    Refill:  2   TRUEplus Lancets 28G MISC    Sig: Use to measure blood sugar twice a day    Dispense:  100 each    Refill:  1   valsartan (DIOVAN) 160 MG tablet    Sig: Take 1 tablet (160 mg total) by mouth daily.    Dispense:  90 tablet    Refill:  1   follow-up: Return in about 6 months (around 09/28/2023) for htn, diabetes.    Shan Levans, MD

## 2023-03-28 NOTE — Assessment & Plan Note (Signed)
Diabetes appears to be under good control no change in medications 

## 2023-03-28 NOTE — Assessment & Plan Note (Signed)
Involving the back arms and legs  Will represcribe topical steroids and see if we can get this patient the orange card and send him to dermatology for potential molecular biological therapy such as Humira

## 2023-03-28 NOTE — Assessment & Plan Note (Signed)
Continue cholesterol management

## 2023-03-28 NOTE — Patient Instructions (Addendum)
All medications refill sent to pharmacy no change in dosing  Cream given for the places in her back  Please get the application for financial assistance and the orange card process that this will allow [place] to send you to a skin doctor  Return to Dr. Delford Field 6 months  Todos los resurtidos de medicamentos se envan a la farmacia sin cambios en la dosis.  Crema dada para los lugares de su espalda.  Obtenga la solicitud de asistencia financiera y el proceso de la tarjeta naranja que nos permitir enviarlo a TEFL teacher.  Regreso al Dr. Delford Field 6 meses

## 2023-03-31 LAB — MICROALBUMIN / CREATININE URINE RATIO
Creatinine, Urine: 161.4 mg/dL
Microalb/Creat Ratio: 38 mg/g creat — ABNORMAL HIGH (ref 0–29)
Microalbumin, Urine: 60.8 ug/mL

## 2023-04-04 NOTE — Progress Notes (Signed)
/  S:     No chief complaint on file.  62 y.o. male who presents for diabetes evaluation, education, and management.  PMH is significant for T2DM, HTN, HLD, hypertensive retinopathy, GERD, and chronic respiratory failure. Patient was last seen by Primary Care Provider, Dr. Delford Field, on 03/28/2023.   Pharmacy last saw him on 03/16/2023. A1c at that visit was 6.9%. Trulicity was increased to 1.5 mg weekly. Today, patient arrives in good spirits and presents without any assistance. Appointment assistance with an interpreter: Maureen Ralphs [phone].   Family/Social History:  Fhx: cancer, stroke, diabetes Former smoker  Current diabetes medications include: farxiga 5 mg daily, trulicity 1.5 mg weekly, metformin 1000 mg BID Current hypertension medications include: valsartan 160 mg daily, amlodipine 10 mg daily  Current hyperlipidemia medications include: atorvastatin 80 mg daily, fenofibrate 145 mg daily  Patient reports adherence to taking all medications.  Do you feel that your medications are working for you? yes Have you been experiencing any side effects to the medications prescribed? yes Do you have any problems obtaining medications due to transportation or finances? no Insurance coverage: self pay  Patient denies hypoglycemic events.  Reported home fasting blood sugars: 130-140s  Reported 2 hour post-meal/random blood sugars: 150-160s.  Patient reports nocturia (nighttime urination).  Patient denies neuropathy (nerve pain). Patient denies visual changes. Patient reports self foot exams.   Patient reported dietary habits: Eats 2-3 meals/day No changes since last seen.  Within the past 12 months, did you worry whether your food would run out before you got money to buy more? no Within the past 12 months, did the food you bought run out, and you didn't have money to get more? no  Patient-reported exercise habits: minimal exercise  O:   ROS  Physical Exam   Lab Results   Component Value Date   HGBA1C 6.9 03/16/2023   Vitals:   04/05/23 1748  BP: 136/86   Lipid Panel     Component Value Date/Time   CHOL 212 (H) 12/28/2022 1049   TRIG 425 (H) 12/28/2022 1049   HDL 31 (L) 12/28/2022 1049   CHOLHDL 6.8 (H) 12/28/2022 1049   LDLCALC 108 (H) 12/28/2022 1049    Clinical Atherosclerotic Cardiovascular Disease (ASCVD): Yes  The 10-year ASCVD risk score (Arnett DK, et al., 2019) is: 34.4%   Values used to calculate the score:     Age: 42 years     Sex: Male     Is Non-Hispanic African American: No     Diabetic: Yes     Tobacco smoker: No     Systolic Blood Pressure: 136 mmHg     Is BP treated: Yes     HDL Cholesterol: 31 mg/dL     Total Cholesterol: 212 mg/dL   Patient is participating in a Managed Medicaid Plan: No  A/P: Diabetes longstanding currently controlled, at goal. Patient is able to verbalize appropriate hypoglycemia management plan. Medication adherence appears suboptimal as patient is taking trulicity two times a week instead of weekly.  -Continued Trulicity (dulaglutide) 1.5 mg weekly. Instructed to pick up at the pharmacy and take as directed -Continued SGLT2-I Farxiga (dapagliflozin) 5 mg daily. Counseled on sick day rules. -Continued metformin 1000 mg BID.  -Patient educated on purpose, proper use, and potential adverse effects of trulicity, farxiga, and metformin.  -Extensively discussed pathophysiology of diabetes, recommended lifestyle interventions, dietary effects on blood sugar control.  -Counseled on s/sx of and management of hypoglycemia.  -Next A1c anticipated 8/24.   ASCVD  risk - primary prevention in patient with diabetes. Last LDL is 108 not at goal of <29 mg/dL. ASCVD risk factors include diabetes, blood pressure and 10-year ASCVD risk score of 32.6%. high intensity statin indicated.  -Continued atorvastatin 80 mg daily.   Hypertension longstanding currently controlled. Blood pressure goal of <130/80 mmHg. Medication  adherence optimal. Blood pressure control is suboptimal due to 136/86 mmHg in clinic but patient did not take antihypertensive medications before appointments. -Continued valsartan 160 mg daily and amlodipine 10 mg daily. - Purchased BP cuff to record BP at home and bring to next appointment.  Written patient instructions provided. Patient verbalized understanding of treatment plan.  Total time in face to face counseling 30 minutes.    Follow-up:  Pharmacist 05/17/2023. PCP clinic visit  10/02/2023   Patient seen with  Alesia Banda, PharmD Candidate [employer] 2025    Butch Penny, PharmD, [place], CPP Clinical Pharmacist [facility] [phone]

## 2023-04-05 ENCOUNTER — Ambulatory Visit: Payer: Self-pay | Attending: Critical Care Medicine | Admitting: Pharmacist

## 2023-04-05 ENCOUNTER — Encounter: Payer: Self-pay | Admitting: Pharmacist

## 2023-04-05 ENCOUNTER — Other Ambulatory Visit: Payer: Self-pay

## 2023-04-05 VITALS — BP 136/86

## 2023-04-05 DIAGNOSIS — Z7985 Long-term (current) use of injectable non-insulin antidiabetic drugs: Secondary | ICD-10-CM

## 2023-04-05 DIAGNOSIS — Z7984 Long term (current) use of oral hypoglycemic drugs: Secondary | ICD-10-CM

## 2023-04-05 DIAGNOSIS — E118 Type 2 diabetes mellitus with unspecified complications: Secondary | ICD-10-CM

## 2023-04-11 ENCOUNTER — Other Ambulatory Visit: Payer: Self-pay

## 2023-05-17 ENCOUNTER — Encounter: Payer: Self-pay | Admitting: Pharmacist

## 2023-05-17 ENCOUNTER — Ambulatory Visit: Payer: Self-pay | Attending: Family Medicine | Admitting: Pharmacist

## 2023-05-17 ENCOUNTER — Other Ambulatory Visit: Payer: Self-pay

## 2023-05-17 DIAGNOSIS — E785 Hyperlipidemia, unspecified: Secondary | ICD-10-CM

## 2023-05-17 DIAGNOSIS — E118 Type 2 diabetes mellitus with unspecified complications: Secondary | ICD-10-CM

## 2023-05-17 DIAGNOSIS — Z7984 Long term (current) use of oral hypoglycemic drugs: Secondary | ICD-10-CM

## 2023-05-17 DIAGNOSIS — Z7985 Long-term (current) use of injectable non-insulin antidiabetic drugs: Secondary | ICD-10-CM

## 2023-05-17 DIAGNOSIS — E781 Pure hyperglyceridemia: Secondary | ICD-10-CM

## 2023-05-17 NOTE — Progress Notes (Signed)
/    S:     No chief complaint on file.  62 y.o. male who presents for diabetes evaluation, education, and management. PMH is significant for T2DM, HTN, HLD, hypertensive retinopathy, GERD, and chronic respiratory failure. Patient was last seen by Primary Care Provider, Dr. Delford Field, on 03/28/2023. We saw him on 04/05/2023 and continued her regimen.  A1c shows good control. Patient with no complaints today. Endorses medication adherence. Appointment assistance with an interpreter: Maureen Ralphs [phone].   Family/Social History:  Fhx: cancer, stroke, diabetes Former smoker  Current diabetes medications include: farxiga 5 mg daily, trulicity 1.5 mg weekly, metformin 1000 mg BID Patient reports adherence to taking all medications.  Insurance coverage: self pay  Patient denies hypoglycemic events.  Reported home fasting blood sugars: 130-140s  Reported 2 hour post-meal/random blood sugars: 150-160s.  Patient denies nocturia (nighttime urination).  Patient denies neuropathy (nerve pain). Patient denies visual changes. Patient reports self foot exams.   Patient reported dietary habits: Eats 2-3 meals/day No changes since last seen.  Patient-reported exercise habits: minimal exercise  O:   ROS  Physical Exam   Lab Results  Component Value Date   HGBA1C 6.9 03/16/2023   There were no vitals filed for this visit.  Lipid Panel     Component Value Date/Time   CHOL 212 (H) 12/28/2022 1049   TRIG 425 (H) 12/28/2022 1049   HDL 31 (L) 12/28/2022 1049   CHOLHDL 6.8 (H) 12/28/2022 1049   LDLCALC 108 (H) 12/28/2022 1049    Clinical Atherosclerotic Cardiovascular Disease (ASCVD): Yes  The 10-year ASCVD risk score (Arnett DK, et al., 2019) is: 36.2%   Values used to calculate the score:     Age: 6 years     Sex: Male     Is Non-Hispanic African American: No     Diabetic: Yes     Tobacco smoker: No     Systolic Blood Pressure: 136 mmHg     Is BP treated: Yes     HDL Cholesterol: 31  mg/dL     Total Cholesterol: 212 mg/dL   Patient is participating in a Managed Medicaid Plan: No  A/P: Diabetes longstanding currently controlled, at goal. Patient is able to verbalize appropriate hypoglycemia management plan. Medication adherence appears suboptimal as patient is taking trulicity two times a week instead of weekly.  -Continued Trulicity (dulaglutide) 1.5 mg weekly.  -Continued SGLT2-I Farxiga (dapagliflozin) 5 mg daily. Counseled on sick day rules. -Continued metformin 1000 mg BID.  -Patient educated on purpose, proper use, and potential adverse effects of trulicity, farxiga, and metformin.  -Extensively discussed pathophysiology of diabetes, recommended lifestyle interventions, dietary effects on blood sugar control.  -Counseled on s/sx of and management of hypoglycemia.  -Next A1c anticipated 8/24.   ASCVD risk - primary prevention in patient with diabetes. Last LDL is 108 not at goal of <23 mg/dL. ASCVD risk factors include diabetes, blood pressure and 10-year ASCVD risk score of 36.2%. High intensity statin indicated.  -Continued atorvastatin 80 mg daily.  -Continued fenofibrate 145 mg daily.  -Lipid panel today  Written patient instructions provided. Patient verbalized understanding of treatment plan.  Total time in face to face counseling 30 minutes.    Follow-up:  Pharmacist 05/17/2023. PCP clinic visit  10/02/2023   Patient seen with  Alesia Banda, PharmD Candidate [employer]    Butch Penny, PharmD, [place], CPP Clinical Pharmacist [facility] [phone]

## 2023-05-18 LAB — LIPID PANEL
Chol/HDL Ratio: 5.4 ratio — ABNORMAL HIGH (ref 0.0–5.0)
Cholesterol, Total: 179 mg/dL (ref 100–199)
HDL: 33 mg/dL — ABNORMAL LOW (ref >39)
LDL Chol Calc (NIH): 104 mg/dL — ABNORMAL HIGH (ref 0–99)
Triglycerides: 244 mg/dL — ABNORMAL HIGH (ref 0–149)
VLDL Cholesterol Cal: 42 mg/dL — ABNORMAL HIGH (ref 5–40)

## 2023-05-19 NOTE — Progress Notes (Signed)
Let patient know cholesterol is improved but he needs to continue to follow more of a plant-based diet and low-fat diet

## 2023-05-21 ENCOUNTER — Telehealth: Payer: Self-pay

## 2023-05-21 NOTE — Telephone Encounter (Signed)
-----   Message from Shan Levans sent at 05/19/2023  6:37 AM EDT ----- Let patient know cholesterol is improved but he needs to continue to follow more of a plant-based diet and low-fat diet

## 2023-05-21 NOTE — Telephone Encounter (Signed)
Pt was called and vm was left, Information has been sent to nurse pool.   Interpreter id #[id number]

## 2023-05-23 ENCOUNTER — Other Ambulatory Visit: Payer: Self-pay

## 2023-07-16 ENCOUNTER — Other Ambulatory Visit: Payer: Self-pay

## 2023-07-19 ENCOUNTER — Encounter: Payer: Self-pay | Admitting: Pharmacist

## 2023-07-19 ENCOUNTER — Other Ambulatory Visit: Payer: Self-pay

## 2023-07-19 ENCOUNTER — Other Ambulatory Visit: Payer: Self-pay | Admitting: Critical Care Medicine

## 2023-07-19 ENCOUNTER — Ambulatory Visit: Payer: Self-pay | Attending: Critical Care Medicine | Admitting: Pharmacist

## 2023-07-19 VITALS — BP 131/79 | HR 60

## 2023-07-19 DIAGNOSIS — Z7985 Long-term (current) use of injectable non-insulin antidiabetic drugs: Secondary | ICD-10-CM

## 2023-07-19 DIAGNOSIS — E118 Type 2 diabetes mellitus with unspecified complications: Secondary | ICD-10-CM

## 2023-07-19 DIAGNOSIS — Z7984 Long term (current) use of oral hypoglycemic drugs: Secondary | ICD-10-CM

## 2023-07-19 LAB — POCT GLYCOSYLATED HEMOGLOBIN (HGB A1C): HbA1c, POC (controlled diabetic range): 7.3 % — AB (ref 0.0–7.0)

## 2023-07-19 MED ORDER — TADALAFIL 10 MG PO TABS
10.0000 mg | ORAL_TABLET | ORAL | 1 refills | Status: DC | PRN
  Filled 2023-07-19: qty 10, 20d supply, fill #0

## 2023-07-19 MED ORDER — TRULICITY 3 MG/0.5ML ~~LOC~~ SOAJ
3.0000 mg | SUBCUTANEOUS | 1 refills | Status: DC
  Filled 2023-07-19: qty 2, 28d supply, fill #0

## 2023-07-19 MED ORDER — DAPAGLIFLOZIN PROPANEDIOL 10 MG PO TABS
10.0000 mg | ORAL_TABLET | Freq: Every day | ORAL | 3 refills | Status: DC
  Filled 2023-07-19: qty 30, 30d supply, fill #0

## 2023-07-19 NOTE — Progress Notes (Signed)
S:     62 y.o. male who presents for diabetes evaluation, education, and management. PMH is significant for T2DM, HTN, HLD, hypertensive retinopathy, GERD, and chronic respiratory failure. Patient's chart reports diabetes was diagnosed in 2022.    Patient was last seen by Primary Care Provider, Dr. Delford Field, on 03/28/2023 and by pharmacist on 05/17/2023. At last visit, diabetes medication regimen was continued. Lipid panel from visit reports LDL of 104 mg/dL, downtrending from 725 mg/dL 6 mo ago and 366 mg/dL 9 mo ago.  Patient arrives in good spirits and presents with appointment assistance with an interpreter: Margurite Auerbach [phone]. He reports no issues with medications but does need refills. He endorses right elbow pain that started recently. He had tried taking Tylenol but it did not help.   Family/Social History:  Fhx: cancer, stroke, diabetes Former smoker  Current diabetes medications include: Farxiga 5 mg daily, Trulicity 1.5 mg weekly, metformin 1000 mg BID Current hypertension medications include: amlodipine 10 mg, valsartan 160 mg  Current hyperlipidemia medications include: atorvastatin 80 mg, fenofibrate 145 mg daily   Patient reports adherence to taking all medications as prescribed. However, fill history shows patient has not filled medications recently.   Do you feel that your medications are working for you? Yes Have you been experiencing any side effects to the medications prescribed? No Do you have any problems obtaining medications due to transportation or finances? No Insurance coverage: Self-pay  Patient denies hypoglycemic events.  Reported home fasting blood sugars: 120-140s; today's reading was 140   - Also checks around 8 pm and reports that it is typically around 140s   Patient denies nocturia (nighttime urination).  Patient denies neuropathy (nerve pain). Patient denies visual changes. Patient reports self foot exams.   Patient reported dietary habits: Eats  2-3 meals/day - no changes since last seen   Patient-reported exercise habits: minimal exercise   O:   Lab Results  Component Value Date   HGBA1C 7.3 (A) 07/19/2023   Vitals:   07/19/23 1005  BP: 131/79  Pulse: 60   Lipid Panel     Component Value Date/Time   CHOL 179 05/17/2023 0900   TRIG 244 (H) 05/17/2023 0900   HDL 33 (L) 05/17/2023 0900   CHOLHDL 5.4 (H) 05/17/2023 0900   LDLCALC 104 (H) 05/17/2023 0900    Clinical Atherosclerotic Cardiovascular Disease (ASCVD): Yes  The 10-year ASCVD risk score (Arnett DK, et al., 2019) is: 29.8%   Values used to calculate the score:     Age: 89 years     Sex: Male     Is Non-Hispanic African American: No     Diabetic: Yes     Tobacco smoker: No     Systolic Blood Pressure: 131 mmHg     Is BP treated: Yes     HDL Cholesterol: 33 mg/dL     Total Cholesterol: 179 mg/dL   Patient is participating in a Managed Medicaid Plan: No   A/P: Diabetes longstanding, currently uncontrolled. A1c of 7.3% at today's visit. Patient is able to verbalize appropriate hypoglycemia management plan. Medication adherence appears suboptimal based on fill history. Control is suboptimal most likely due to medication adherence, need for medication optimization and lifestyle factors. Counseled patient on incorporating exercise into lifestyle and dietary changes to help achieve A1c goal. -Increase Farxiga to 10 mg daily  -Increase Trulicity to 3 mg weekly  -Continue metformin 1000 mg BID -Extensively discussed pathophysiology of diabetes, recommended lifestyle interventions, dietary effects on blood sugar  control.  -Counseled on s/sx of and management of hypoglycemia  -Next A1c anticipated 09/2023   ASCVD risk - primary prevention in patient with diabetes. Last LDL is 108 not at goal of <16 mg/dL. ASCVD risk factors include diabetes, blood pressure and 10-year ASCVD risk score of 36.2%. High intensity statin indicated.  -Continue atorvastatin 80 mg daily   -Continue fenofibrate 145 mg daily   Hypertension longstanding. BP was 131/79 mmHg today. Blood pressure goal of <130/80 mmHg. Patient is close to goal. Medication adherence suboptimal based on fill history. -Continue amlodipine 10 mg and valsartan 160 mg   Cholesterol, tadalafil, and diabetes medications were refilled/filled today. Also, recommended patient try OTC ibuprofen for elbow pain.    Written patient instructions provided. Patient verbalized understanding of treatment plan.  Total time in face to face counseling 30 minutes.    Follow-up:  Pharmacist on 08/20/2023 PCP clinic visit on 10/02/2023   Roslyn Smiling, PharmD PGY1 Pharmacy Resident 07/19/2023 12:36 PM

## 2023-07-20 ENCOUNTER — Other Ambulatory Visit: Payer: Self-pay

## 2023-07-23 ENCOUNTER — Other Ambulatory Visit: Payer: Self-pay

## 2023-07-31 ENCOUNTER — Other Ambulatory Visit: Payer: Self-pay

## 2023-08-19 NOTE — Progress Notes (Unsigned)
S:     62 y.o. male who presents for diabetes evaluation, education, and management. PMH is significant for T2DM, HTN, HLD, hypertensive retinopathy, GERD, and chronic respiratory failure. Walter Phillips's chart reports diabetes was diagnosed in 2022.    Walter Phillips was last seen by Primary Care Provider, Dr. Delford Field, on 03/28/2023 and by pharmacist on 07/19/2023. At last visit, A1c slightly elevated 7.3%. Fill hx revealed medication non-adherence. Counseled Walter Phillips on importance of taking medications as prescribed and increased Farxiga to 10 mg and Trulicity to 3 mg weekly. On last lipid panel in July LDL was 104 and TG 244. At last visit, encouraged adherence to atorvastatin and fenofibrate. BP was close to goal (131/79), encouraged medication adherence.  Walter Phillips arrives in good spirits and presents with appointment assistance with an interpreter: Marlynn Perking [phone]. Walter Phillips reports adherence to medications as prescribed. Tolerating Trulicity well. Denies GI side effects other than occasional loose stool (~once a week). Denies appetite suppression. Walter Phillips reports he has not noticed much improvement in BG since last visit. Fastings in AM are about the same. Although has had some improvement in afternoon BG. Reports he needs refills on metformin and atorvastatin. Has not been taking fenofibrate.  Family/Social History:  Fhx: cancer, stroke, diabetes Former smoker  Current diabetes medications include: Farxiga 10 mg daily, Trulicity 3 mg weekly (Wednesdays), metformin 1000 mg BID Current hypertension medications include: amlodipine 10 mg, valsartan 160 mg  Current hyperlipidemia medications include: atorvastatin 80 mg, fenofibrate 145 mg daily   Walter Phillips reports adherence to taking all medications as prescribed.   Do you feel that your medications are working for you? Yes Have you been experiencing any side effects to the medications prescribed? No Do you have any problems obtaining medications due to  transportation or finances? No Insurance coverage: Self-pay  Walter Phillips denies hypoglycemic events.  Reported home fasting blood sugars: fastings 120-140s In afternoon after eating BG is 120s  Walter Phillips denies nocturia (nighttime urination).  Walter Phillips denies neuropathy (nerve pain). Walter Phillips denies visual changes. Walter Phillips reports self foot exams.   Walter Phillips reported dietary habits: Eats 2-3 meals/day - no changes since last seen   Walter Phillips-reported exercise habits: minimal exercise   O:   Lab Results  Component Value Date   HGBA1C 7.3 (A) 07/19/2023   There were no vitals filed for this visit.  Lipid Panel     Component Value Date/Time   CHOL 179 05/17/2023 0900   TRIG 244 (H) 05/17/2023 0900   HDL 33 (L) 05/17/2023 0900   CHOLHDL 5.4 (H) 05/17/2023 0900   LDLCALC 104 (H) 05/17/2023 0900    Clinical Atherosclerotic Cardiovascular Disease (ASCVD): Yes  The 10-year ASCVD risk score (Arnett DK, et al., 2019) is: 29.8%   Values used to calculate the score:     Age: 11 years     Sex: Male     Is Non-Hispanic African American: No     Diabetic: Yes     Tobacco smoker: No     Systolic Blood Pressure: 131 mmHg     Is BP treated: Yes     HDL Cholesterol: 33 mg/dL     Total Cholesterol: 179 mg/dL   Walter Phillips is participating in a Managed Medicaid Plan: No   A/P: Diabetes longstanding, currently uncontrolled based on A1c 7.3% at last visit. Walter Phillips reported fasting BG is close to goal, but fasting BG has not changed since increasing Trulicity and Farxiga at last visit. PP BG have improved some with Walter Phillips reported afternoon BG always 120s.  Walter Phillips expressed concerned with higher BG readings in AM versus afternoon after he has eaten that day. Discussed and explained that he may be experiencing dawn phenomenon and this is not something to worry about. Walter Phillips is able to verbalize appropriate hypoglycemia management plan. Medication adherence appears improved. Control is suboptimal most  likely due to need for medication optimization. Will increase Trulicity to max dose today given he is tolerating well. -Increase Trulicity to 4.5 mg weekly  -Continue Farxiga 10 mg daily -Continue metformin 1000 mg BID -Extensively discussed pathophysiology of diabetes, recommended lifestyle interventions, dietary effects on blood sugar control.  -Counseled on s/sx of and management of hypoglycemia  -Next A1c anticipated 09/2023   ASCVD risk - primary prevention in Walter Phillips with diabetes. Last LDL is 104 not at goal of <81 mg/dL. ASCVD risk factors include diabetes, blood pressure and 10-year ASCVD risk score of 29.8%. High intensity statin indicated.  -Continue atorvastatin 80 mg daily - refilled today -F/u lipid panel today to determine need for restarting fenofibrate.  Written Walter Phillips instructions provided. Walter Phillips verbalized understanding of treatment plan.  Total time in face to face counseling 30 minutes.    Follow-up:  Pharmacist in 1 month PCP visit in December   Jarrett Ables, PharmD PGY-1 Pharmacy Resident

## 2023-08-20 ENCOUNTER — Ambulatory Visit: Payer: Self-pay | Attending: Critical Care Medicine | Admitting: Pharmacist

## 2023-08-20 ENCOUNTER — Other Ambulatory Visit: Payer: Self-pay

## 2023-08-20 ENCOUNTER — Encounter: Payer: Self-pay | Admitting: Pharmacist

## 2023-08-20 DIAGNOSIS — E781 Pure hyperglyceridemia: Secondary | ICD-10-CM

## 2023-08-20 DIAGNOSIS — E118 Type 2 diabetes mellitus with unspecified complications: Secondary | ICD-10-CM

## 2023-08-20 DIAGNOSIS — Z7984 Long term (current) use of oral hypoglycemic drugs: Secondary | ICD-10-CM

## 2023-08-20 DIAGNOSIS — E785 Hyperlipidemia, unspecified: Secondary | ICD-10-CM

## 2023-08-20 DIAGNOSIS — Z7985 Long-term (current) use of injectable non-insulin antidiabetic drugs: Secondary | ICD-10-CM

## 2023-08-20 MED ORDER — TRULICITY 4.5 MG/0.5ML ~~LOC~~ SOAJ
4.5000 mg | SUBCUTANEOUS | 3 refills | Status: AC
  Filled 2023-08-20 – 2023-10-09 (×2): qty 2, 28d supply, fill #0

## 2023-08-20 MED ORDER — ATORVASTATIN CALCIUM 80 MG PO TABS
80.0000 mg | ORAL_TABLET | Freq: Every day | ORAL | 3 refills | Status: DC
  Filled 2023-08-20: qty 90, 90d supply, fill #0

## 2023-08-21 LAB — LIPID PANEL
Chol/HDL Ratio: 4.3 ratio (ref 0.0–5.0)
Cholesterol, Total: 151 mg/dL (ref 100–199)
HDL: 35 mg/dL — ABNORMAL LOW (ref >39)
LDL Chol Calc (NIH): 78 mg/dL (ref 0–99)
Triglycerides: 228 mg/dL — ABNORMAL HIGH (ref 0–149)
VLDL Cholesterol Cal: 38 mg/dL (ref 5–40)

## 2023-08-24 ENCOUNTER — Telehealth: Payer: Self-pay

## 2023-08-24 NOTE — Telephone Encounter (Signed)
Using [employer]#[id number]. Pt given lab results per notes of Dr. Alvis Lemmings on 08/21/23. Pt verbalized understanding.   Walter Register, MD 08/21/2023  6:08 PM EDT     Cholesterol has improved compared to last set of labs.

## 2023-08-29 ENCOUNTER — Other Ambulatory Visit: Payer: Self-pay

## 2023-09-24 ENCOUNTER — Ambulatory Visit: Payer: Self-pay | Admitting: Pharmacist

## 2023-10-02 ENCOUNTER — Ambulatory Visit: Payer: Self-pay | Admitting: Critical Care Medicine

## 2023-10-06 NOTE — Progress Notes (Unsigned)
Established Patient Office Visit  Subjective:  Patient ID: Walter Phillips, male    DOB: October 28, 1961  Age: 62 y.o. MRN: [id number]  CC:  No chief complaint on file.   HPI 11/2021 [facility] presents for 53-month diabetes follow-up blood sugars have been 116 and 96 he does have hypertriglyceridemia this needs to be repeated checked.  On arrival blood pressure 132/81.  This visit was assisted by Spanish interpreter [name] [phone]  Patient has no other complaints does need refills on medication  He is due an eye exam he states he will get this accomplished  Patient still has left knee pain he had this injected previously will refer to orthopedic surgery   6/15 this visit was assisted by video Spanish interpreter [name] [phone] Patient seen in return follow-up and he has had increasing blood sugars 150 160.  He has not been able to exercise as much and he ran out of his Trulicity and did not have it refilled after February.  Patient is also run out of his valsartan for 3 days.  On arrival blood pressure elevated 143/80  Patient is on the amlodipine 10 mg daily maintains the metformin  Patient's been eating more processed food as well.  He also eats a lot of plan Taine and pork and hashbrowns. Patient needs another colon cancer screening.  He did have a recent eye exam which showed mild retinopathy due to hypertension not due to diabetes, mild cataract dry eyes normal pressure repeat exam will be 1 year  From the standpoint of the patient's pulmonary fibrosis he is improved he says he is back to 85% of where he was before he was ill with COVID in 2020  Patient needs a urine microalbumin at this visit and a foot exam  11/23/22  Patient seen in return follow-up visit assisted by Spanish interpreter [name] [phone].  Patient states his breathing is under good control with no difficulty.He has been followed by clinical pharmacy and has had improvement in his diabetes There are no other  complaints The patient was having chest pain had a negative ECG stress test and has had no further pain since that time  5/28 Patient seen in return follow-up and complains of rash on his back.  The patient is taking the Trulicity and [place] and metformin.  He saw our clinical pharmacist who made these changes.  Blood sugar today is 191 but at home he stays in the 120s to 140s.  He did not eat this morning.   Past Medical History:  Diagnosis Date   Chronic respiratory failure with hypoxia (HCC) 10/15/2019   Diabetes mellitus, type II (HCC)    Dyslipidemia    Hypertension     Past Surgical History:  Procedure Laterality Date   APPENDECTOMY     LAPAROSCOPIC CHOLECYSTECTOMY     LAPAROSCOPIC SIGMOID COLECTOMY     VENTRAL HERNIA REPAIR  05/27/2014    Family History  Problem Relation Age of Onset   Early death Mother        childbirth   Cancer Father    Stomach cancer Sister 11   Stroke Sister    Diabetes Daughter     Social History   Socioeconomic History   Marital status: Married    Spouse name: Not on file   Number of children: Not on file   Years of education: Not on file   Highest education level: Not on file  Occupational History   Not on file  Tobacco Use  Smoking status: Former    Current packs/day: 0.50    Types: Cigarettes   Smokeless tobacco: Never   Tobacco comments:    Few a cigarettes a week years ago.   Vaping Use   Vaping status: Never Used  Substance and Sexual Activity   Alcohol use: Not Currently    Comment: occ   Drug use: No   Sexual activity: Not on file  Other Topics Concern   Not on file  Social History Narrative   Lives with wife.    Social Determinants of Health   Financial Resource Strain: Low Risk  (02/08/2023)   Overall Financial Resource Strain (CARDIA)    Difficulty of Paying Living Expenses: Not very hard  Food Insecurity: No Food Insecurity (02/08/2023)   Hunger Vital Sign    Worried About Running Out of Food in the Last  Year: Never true    Ran Out of Food in the Last Year: Never true  Transportation Needs: No Transportation Needs (02/08/2023)   PRAPARE - Administrator, Civil Service (Medical): No    Lack of Transportation (Non-Medical): No  Physical Activity: Inactive (02/08/2023)   Exercise Vital Sign    Days of Exercise per Week: 0 days    Minutes of Exercise per Session: 0 min  Stress: No Stress Concern Present (02/08/2023)   [employer] of Occupational Health - Occupational Stress Questionnaire    Feeling of Stress : Not at all  Social Connections: Socially Integrated (02/08/2023)   Social Connection and Isolation Panel [NHANES]    Frequency of Communication with Friends and Family: More than three times a week    Frequency of Social Gatherings with Friends and Family: More than three times a week    Attends Religious Services: 1 to 4 times per year    Active Member of [employer] or Organizations: Yes    Attends Banker Meetings: 1 to 4 times per year    Marital Status: Married  Catering manager Violence: Not At Risk (02/08/2023)   Humiliation, Afraid, Rape, and Kick questionnaire    Fear of Current or Ex-Partner: No    Emotionally Abused: No    Physically Abused: No    Sexually Abused: No    Outpatient Medications Prior to Visit  Medication Sig Dispense Refill   amLODipine (NORVASC) 10 MG tablet Take 1 tablet (10 mg total) by mouth daily. 90 tablet 1   atorvastatin (LIPITOR) 80 MG tablet Take 1 tablet (80 mg total) by mouth daily. 90 tablet 3   Blood Glucose Monitoring Suppl (TRUE METRIX METER) w/Device KIT Use to measure blood sugar twice a day 1 kit 0   clobetasol cream (TEMOVATE) 0.05 % Apply 1 application topically to the affected area(s) 2 (two) times daily. 30 g 1   dapagliflozin propanediol (FARXIGA) 10 MG TABS tablet Take 1 tablet (10 mg total) by mouth daily before breakfast. 30 tablet 3   Dulaglutide (TRULICITY) 4.5 MG/0.5ML SOPN Inject 4.5 mg as directed once  a week. 2 mL 3   fenofibrate (TRICOR) 145 MG tablet Take 1 tablet (145 mg total) by mouth once daily. 60 tablet 1   glucose blood (TRUE METRIX BLOOD GLUCOSE TEST) test strip Use as instructed 100 each 12   metFORMIN (GLUCOPHAGE) 1000 MG tablet Take 1 tablet (1,000 mg total) by mouth 2 (two) times daily with a meal. 60 tablet 2   olopatadine (PATANOL) 0.1 % ophthalmic solution Place 1 drop into the left eye 2 (two) times daily.  5 mL 0   omeprazole (PRILOSEC) 40 MG capsule Take 1 capsule (40 mg total) by mouth daily. 30 capsule 3   tadalafil (CIALIS) 10 MG tablet Take 1 tablet (10 mg total) by mouth every other day as needed for erectile dysfunction. 10 tablet 1   TRUEplus Lancets 28G MISC Use to measure blood sugar twice a day 100 each 1   valsartan (DIOVAN) 160 MG tablet Take 1 tablet (160 mg total) by mouth daily. 90 tablet 1   No facility-administered medications prior to visit.    No Known Allergies  ROS Review of Systems  Constitutional: Negative.   HENT: Negative.  Negative for ear pain, postnasal drip, rhinorrhea, sinus pressure, sore throat, trouble swallowing and voice change.   Eyes: Negative.   Respiratory: Negative.  Negative for apnea, cough, choking, chest tightness, shortness of breath, wheezing and stridor.   Cardiovascular: Negative.  Negative for chest pain, palpitations and leg swelling.  Gastrointestinal: Negative.  Negative for abdominal distention, abdominal pain, nausea and vomiting.  Genitourinary: Negative.   Musculoskeletal: Negative.  Negative for arthralgias and myalgias.  Skin:  Positive for rash.  Allergic/Immunologic: Negative.  Negative for environmental allergies and food allergies.  Neurological: Negative.  Negative for dizziness, syncope, weakness and headaches.  Hematological: Negative.  Negative for adenopathy. Does not bruise/bleed easily.  Psychiatric/Behavioral: Negative.  Negative for agitation and sleep disturbance. The patient is not  nervous/anxious.       Objective:    Physical Exam Vitals reviewed.  Constitutional:      Appearance: Normal appearance. He is well-developed. He is not diaphoretic.  HENT:     Head: Normocephalic and atraumatic.     Nose: No nasal deformity, septal deviation, mucosal edema or rhinorrhea.     Right Sinus: No maxillary sinus tenderness or frontal sinus tenderness.     Left Sinus: No maxillary sinus tenderness or frontal sinus tenderness.     Mouth/Throat:     Pharynx: No oropharyngeal exudate.  Eyes:     General: No scleral icterus.    Conjunctiva/sclera: Conjunctivae normal.     Pupils: Pupils are equal, round, and reactive to light.  Neck:     Thyroid: No thyromegaly.     Vascular: No carotid bruit or JVD.     Trachea: Trachea normal. No tracheal tenderness or tracheal deviation.  Cardiovascular:     Rate and Rhythm: Normal rate and regular rhythm.     Chest Wall: PMI is not displaced.     Pulses: Normal pulses. No decreased pulses.     Heart sounds: Normal heart sounds, S1 normal and S2 normal. Heart sounds not distant. No murmur heard.    No systolic murmur is present.     No diastolic murmur is present.     No friction rub. No gallop. No S3 or S4 sounds.  Pulmonary:     Effort: No tachypnea, accessory muscle usage or respiratory distress.     Breath sounds: No stridor. No decreased breath sounds, wheezing, rhonchi or rales.  Chest:     Chest wall: No tenderness.  Abdominal:     General: Bowel sounds are normal. There is no distension.     Palpations: Abdomen is soft. Abdomen is not rigid.     Tenderness: There is no abdominal tenderness. There is no guarding or rebound.  Musculoskeletal:        General: Normal range of motion.     Cervical back: Normal range of motion and neck supple. No edema, erythema  or rigidity. No muscular tenderness. Normal range of motion.     Comments: Normal foot exam except for mild onychomycosis both toenails of great toes and decrease  sensation medial aspect posterior heel on foot left  Lymphadenopathy:     Head:     Right side of head: No submental or submandibular adenopathy.     Left side of head: No submental or submandibular adenopathy.     Cervical: No cervical adenopathy.  Skin:    General: Skin is warm and dry.     Coloration: Skin is not pale.     Findings: Rash present.     Nails: There is no clubbing.     Comments: Rash on the back arms and legs compatible with psoriasis  Neurological:     Mental Status: He is alert and oriented to person, place, and time.     Sensory: No sensory deficit.  Psychiatric:        Speech: Speech normal.        Behavior: Behavior normal.     There were no vitals taken for this visit. Wt Readings from Last 3 Encounters:  03/28/23 157 lb (71.2 kg)  11/23/22 157 lb 3.2 oz (71.3 kg)  08/31/22 162 lb (73.5 kg)     Health Maintenance Due  Topic Date Due   OPHTHALMOLOGY EXAM  04/13/2023   FOOT EXAM  04/14/2023   INFLUENZA VACCINE  05/31/2023   Diabetic kidney evaluation - eGFR measurement  08/24/2023    There are no preventive care reminders to display for this patient.  No results found for: "TSH" Lab Results  Component Value Date   WBC 5.1 05/19/2020   HGB 15.6 05/19/2020   HCT 47.1 05/19/2020   MCV 90 05/19/2020   PLT 199 05/19/2020   Lab Results  Component Value Date   NA 137 08/23/2022   K 4.5 08/23/2022   CO2 25 08/23/2022   GLUCOSE 195 (H) 08/23/2022   BUN 13 08/23/2022   CREATININE 0.61 (L) 08/23/2022   BILITOT 1.1 08/23/2022   ALKPHOS 148 (H) 08/23/2022   AST 26 08/23/2022   ALT 37 08/23/2022   PROT 7.9 08/23/2022   ALBUMIN 4.6 08/23/2022   CALCIUM 9.8 08/23/2022   ANIONGAP 10 11/07/2019   EGFR 108 08/23/2022   Lab Results  Component Value Date   CHOL 151 08/20/2023   Lab Results  Component Value Date   HDL 35 (L) 08/20/2023   Lab Results  Component Value Date   LDLCALC 78 08/20/2023   Lab Results  Component Value Date   TRIG  228 (H) 08/20/2023   Lab Results  Component Value Date   CHOLHDL 4.3 08/20/2023   Lab Results  Component Value Date   HGBA1C 7.3 (A) 07/19/2023      Assessment & Plan:   Problem List Items Addressed This Visit   None   No orders of the defined types were placed in this encounter.  follow-up: No follow-ups on file.    Shan Levans, MD

## 2023-10-09 ENCOUNTER — Other Ambulatory Visit: Payer: Self-pay

## 2023-10-09 ENCOUNTER — Encounter: Payer: Self-pay | Admitting: Critical Care Medicine

## 2023-10-09 ENCOUNTER — Ambulatory Visit: Payer: Self-pay | Attending: Critical Care Medicine | Admitting: Critical Care Medicine

## 2023-10-09 VITALS — BP 130/77 | HR 67 | Wt 156.0 lb

## 2023-10-09 DIAGNOSIS — E781 Pure hyperglyceridemia: Secondary | ICD-10-CM

## 2023-10-09 DIAGNOSIS — E1165 Type 2 diabetes mellitus with hyperglycemia: Secondary | ICD-10-CM

## 2023-10-09 DIAGNOSIS — Z7985 Long-term (current) use of injectable non-insulin antidiabetic drugs: Secondary | ICD-10-CM

## 2023-10-09 DIAGNOSIS — Z23 Encounter for immunization: Secondary | ICD-10-CM

## 2023-10-09 DIAGNOSIS — E118 Type 2 diabetes mellitus with unspecified complications: Secondary | ICD-10-CM

## 2023-10-09 DIAGNOSIS — Z7984 Long term (current) use of oral hypoglycemic drugs: Secondary | ICD-10-CM

## 2023-10-09 DIAGNOSIS — H35033 Hypertensive retinopathy, bilateral: Secondary | ICD-10-CM

## 2023-10-09 DIAGNOSIS — I10 Essential (primary) hypertension: Secondary | ICD-10-CM

## 2023-10-09 DIAGNOSIS — E785 Hyperlipidemia, unspecified: Secondary | ICD-10-CM

## 2023-10-09 DIAGNOSIS — E119 Type 2 diabetes mellitus without complications: Secondary | ICD-10-CM

## 2023-10-09 DIAGNOSIS — L409 Psoriasis, unspecified: Secondary | ICD-10-CM

## 2023-10-09 LAB — POCT GLYCOSYLATED HEMOGLOBIN (HGB A1C): HbA1c, POC (controlled diabetic range): 7.3 % — AB (ref 0.0–7.0)

## 2023-10-09 LAB — GLUCOSE, POCT (MANUAL RESULT ENTRY): POC Glucose: 141 mg/dL — AB (ref 70–99)

## 2023-10-09 MED ORDER — TRUEPLUS LANCETS 28G MISC
1 refills | Status: AC
  Filled 2023-10-09: qty 100, 50d supply, fill #0
  Filled 2024-04-08: qty 100, 50d supply, fill #1

## 2023-10-09 MED ORDER — VALSARTAN 160 MG PO TABS
160.0000 mg | ORAL_TABLET | Freq: Every day | ORAL | 1 refills | Status: AC
  Filled 2023-10-09: qty 90, 90d supply, fill #0

## 2023-10-09 MED ORDER — AMLODIPINE BESYLATE 10 MG PO TABS
10.0000 mg | ORAL_TABLET | Freq: Every day | ORAL | 1 refills | Status: DC
  Filled 2023-10-09: qty 90, 90d supply, fill #0
  Filled 2024-04-08: qty 90, 90d supply, fill #1

## 2023-10-09 MED ORDER — FENOFIBRATE 145 MG PO TABS
145.0000 mg | ORAL_TABLET | Freq: Every day | ORAL | 1 refills | Status: AC
  Filled 2023-10-09: qty 60, 60d supply, fill #0

## 2023-10-09 MED ORDER — TRUE METRIX BLOOD GLUCOSE TEST VI STRP
ORAL_STRIP | 12 refills | Status: AC
  Filled 2023-10-09: qty 100, 50d supply, fill #0
  Filled 2024-04-08: qty 100, 50d supply, fill #1

## 2023-10-09 MED ORDER — ATORVASTATIN CALCIUM 80 MG PO TABS
80.0000 mg | ORAL_TABLET | Freq: Every day | ORAL | 3 refills | Status: AC
  Filled 2023-10-09: qty 90, 90d supply, fill #0
  Filled 2024-04-08: qty 90, 90d supply, fill #1
  Filled 2024-09-02: qty 30, 30d supply, fill #2

## 2023-10-09 MED ORDER — METFORMIN HCL 1000 MG PO TABS
1000.0000 mg | ORAL_TABLET | Freq: Two times a day (BID) | ORAL | 2 refills | Status: DC
  Filled 2023-10-09: qty 60, 30d supply, fill #0
  Filled 2024-04-08: qty 60, 30d supply, fill #1
  Filled 2024-06-25: qty 60, 30d supply, fill #2

## 2023-10-09 MED ORDER — CLOBETASOL PROPIONATE 0.05 % EX CREA
TOPICAL_CREAM | Freq: Two times a day (BID) | CUTANEOUS | 1 refills | Status: DC
  Filled 2023-10-09: qty 30, 15d supply, fill #0
  Filled 2024-06-25: qty 30, 15d supply, fill #1

## 2023-10-09 MED ORDER — OMEPRAZOLE 40 MG PO CPDR
40.0000 mg | DELAYED_RELEASE_CAPSULE | Freq: Every day | ORAL | 3 refills | Status: AC
  Filled 2023-10-09: qty 30, 30d supply, fill #0

## 2023-10-09 MED ORDER — TADALAFIL 10 MG PO TABS
10.0000 mg | ORAL_TABLET | ORAL | 1 refills | Status: DC | PRN
  Filled 2023-10-09: qty 10, 20d supply, fill #0
  Filled 2024-04-08: qty 10, 20d supply, fill #1

## 2023-10-09 MED ORDER — DAPAGLIFLOZIN PROPANEDIOL 10 MG PO TABS
10.0000 mg | ORAL_TABLET | Freq: Every day | ORAL | 3 refills | Status: AC
  Filled 2023-10-09: qty 30, 30d supply, fill #0

## 2023-10-09 NOTE — Assessment & Plan Note (Signed)
Needs follow-up eye exam he will make an appointment

## 2023-10-09 NOTE — Assessment & Plan Note (Signed)
Not quite at goal continue with high-dose Trulicity and metformin and [place]

## 2023-10-09 NOTE — Assessment & Plan Note (Signed)
Currently at goal continue with current medication including the valsartan 160 mg daily amlodipine 10 mg daily will check labs

## 2023-10-09 NOTE — Patient Instructions (Signed)
Go to [facility] for eye exam for diabetes All medication refilled Labs today Return 6 months

## 2023-10-09 NOTE — Progress Notes (Signed)
Walter Phillips [phone]

## 2023-10-09 NOTE — Assessment & Plan Note (Signed)
Triglyceride remains high continue with fenofibrate

## 2023-10-09 NOTE — Assessment & Plan Note (Signed)
Continue with topical steroid

## 2023-10-09 NOTE — Assessment & Plan Note (Signed)
Cholesterol close to goal continue with fenofibrate and atorvastatin

## 2023-10-12 LAB — COMPREHENSIVE METABOLIC PANEL
ALT: 24 [IU]/L (ref 0–44)
AST: 22 [IU]/L (ref 0–40)
Albumin: 4.7 g/dL (ref 3.9–4.9)
Alkaline Phosphatase: 150 [IU]/L — ABNORMAL HIGH (ref 44–121)
BUN/Creatinine Ratio: 26 — ABNORMAL HIGH (ref 10–24)
BUN: 17 mg/dL (ref 8–27)
Bilirubin Total: 1.3 mg/dL — ABNORMAL HIGH (ref 0.0–1.2)
CO2: 25 mmol/L (ref 20–29)
Calcium: 10.1 mg/dL (ref 8.6–10.2)
Chloride: 102 mmol/L (ref 96–106)
Creatinine, Ser: 0.66 mg/dL — ABNORMAL LOW (ref 0.76–1.27)
Globulin, Total: 3.4 g/dL (ref 1.5–4.5)
Glucose: 141 mg/dL — ABNORMAL HIGH (ref 70–99)
Potassium: 4.7 mmol/L (ref 3.5–5.2)
Sodium: 144 mmol/L (ref 134–144)
Total Protein: 8.1 g/dL (ref 6.0–8.5)
eGFR: 105 mL/min/{1.73_m2} (ref >59)

## 2023-10-12 LAB — MICROALBUMIN / CREATININE URINE RATIO
Creatinine, Urine: 199.5 mg/dL
Microalb/Creat Ratio: 41 mg/g{creat} — ABNORMAL HIGH (ref 0–29)
Microalbumin, Urine: 81.2 ug/mL

## 2023-10-13 NOTE — Progress Notes (Signed)
Let pt know urine study normal,  kidney liver normal

## 2023-10-15 ENCOUNTER — Telehealth: Payer: Self-pay

## 2023-10-15 NOTE — Telephone Encounter (Signed)
Pt was called and is aware of results, DOB was confirmed.   Interpreter id # [id number]

## 2023-10-15 NOTE — Telephone Encounter (Signed)
-----   Message from Shan Levans sent at 10/13/2023  7:49 AM EST ----- Let pt know urine study normal,  kidney liver normal

## 2023-10-18 ENCOUNTER — Other Ambulatory Visit: Payer: Self-pay

## 2023-10-26 ENCOUNTER — Ambulatory Visit: Payer: Self-pay | Attending: Internal Medicine | Admitting: Internal Medicine

## 2023-10-26 ENCOUNTER — Other Ambulatory Visit: Payer: Self-pay

## 2023-10-26 ENCOUNTER — Encounter: Payer: Self-pay | Admitting: Internal Medicine

## 2023-10-26 VITALS — BP 149/77 | Temp 97.9°F | Ht 62.0 in | Wt 160.0 lb

## 2023-10-26 DIAGNOSIS — H8110 Benign paroxysmal vertigo, unspecified ear: Secondary | ICD-10-CM

## 2023-10-26 DIAGNOSIS — I10 Essential (primary) hypertension: Secondary | ICD-10-CM

## 2023-10-26 DIAGNOSIS — R519 Headache, unspecified: Secondary | ICD-10-CM

## 2023-10-26 MED ORDER — MECLIZINE HCL 12.5 MG PO TABS
12.5000 mg | ORAL_TABLET | Freq: Every day | ORAL | 0 refills | Status: AC | PRN
  Filled 2023-10-26: qty 10, 10d supply, fill #0

## 2023-10-26 NOTE — Patient Instructions (Signed)
Vrtigo posicional benigno Benign Positional Vertigo El vrtigo es la sensacin de que usted o todo lo que lo rodea se mueve cuando en realidad eso no sucede. El vrtigo posicional benigno es el tipo de vrtigo ms comn. Generalmente se trata de una enfermedad no daina (benigna). Esta afeccin es posicional. Esto significa que los sntomas son desencadenados por ciertos movimientos y posiciones. Esta afeccin puede ser peligrosa si ocurre mientras est haciendo algo que podra suponer un riesgo para usted o para los dems. Incluye actividades como conducir u operar [place]. Cules son las causas? El odo interno tiene canales llenos de lquido que ayudan a que el cerebro perciba el movimiento y el equilibrio. Cuando el lquido se mueve, el cerebro recibe mensajes sobre la posicin del cuerpo. En el vrtigo posicional benigno, los cristales de calcio que estn en el odo interno se desprenden y alteran la zona del odo interno. Esto hace que el cerebro reciba mensajes confusos sobre la posicin del cuerpo. Qu incrementa el riesgo? Es ms probable que sufra esta afeccin si: Es mujer. Es mayor de 50 aos. Ha sufrido una lesin en la cabeza recientemente. Tiene una enfermedad en el odo interno. Cules son los signos o sntomas? Generalmente, los sntomas de este trastorno se presentan al mover la cabeza o los ojos en diferentes direcciones. Los sntomas pueden aparecer repentinamente y suelen durar menos de un minuto. Incluyen los siguientes: Prdida del equilibrio y cadas. Sensacin de estar dando vueltas o movindose. Sensacin de que el entorno est dando vueltas o movindose. Nuseas y vmitos. Visin borrosa. Mareos. Movimientos oculares involuntarios (nistagmo). Los sntomas pueden ser leves y solo causar problemas menores, o pueden ser graves e interferir en la vida cotidiana. Los episodios de vrtigo posicional benigno pueden repetirse (volver a Research officer, trade union) con el transcurso del  tiempo. Los sntomas tambin pueden mejorar con [employer]. Cmo se diagnostica? Esta afeccin se puede diagnosticar en funcin de lo siguiente: Sus antecedentes mdicos. Un examen fsico de la cabeza, el cuello y los odos. Pruebas de posicin para detectar o estimular el vrtigo. Es posible que le pidan que gire la cabeza y [place] de posicin, como pasar de estar sentado a Teacher, music. El mdico estar pendiente por si aparecen sntomas de vrtigo. Tal vez lo deriven a Catering manager en problemas de la garganta, la nariz y el odo (otorrinolaringlogo), o a uno que se especializa en trastornos del sistema nervioso (neurlogo). Cmo se trata?  Esta afeccin se puede tratar en una sesin en la cual el mdico le pondr la cabeza en posiciones especficas para ayudar a que los cristales desplazados del odo interno se [place]. El tratamiento de esta afeccin puede llevar varias sesiones. [employer] casos son graves, tal vez haya que realizar una ciruga, pero esto no es frecuente. En algunos casos, el vrtigo posicional benigno se resuelve por s solo en el trmino de 2 o 4 semanas. Siga estas instrucciones en su casa: Seguridad Muvase lentamente. Evite algunas posiciones o determinados movimientos repentinos de la cabeza y el cuerpo como se lo haya indicado el mdico. Evite conducir y Restaurant manager, fast food maquinaria hasta que el mdico le indique que es seguro [place]. No haga ninguna tarea que podra ser peligrosa para usted o para Economist en caso de vrtigo. Si tiene dificultad para caminar o mantener el equilibrio, use un bastn para Photographer estabilidad. Si se siente mareado o inestable, sintese de inmediato. Retome sus actividades normales segn lo indicado por el mdico. Pregntele al mdico qu actividades son  seguras para usted. Instrucciones generales Use los medicamentos de venta libre y los recetados solamente como se lo haya indicado el mdico. Beba suficiente lquido como para  Pharmacologist la orina de color amarillo plido. Concurra a todas las visitas de seguimiento. Esto es importante. Comunquese con un mdico si: Tiene fiebre. Su afeccin empeora o presenta sntomas nuevos. Sus familiares o amigos advierten cambios en su comportamiento. Tiene nuseas o vmitos que empeoran. Tiene adormecimiento o sensacin de pinchazos y hormigueo. Busque ayuda de inmediato si: Tiene dificultad para hablar o para moverse. Siempre est mareado o se desmaya. Presenta dolores de cabeza intensos. Tiene debilidad en los brazos o las piernas. Presenta cambios en la audicin o la visin. Presenta rigidez en el cuello. Presenta sensibilidad a la luz. Estos sntomas pueden representar un problema grave que constituye Radio broadcast assistant. No espere a ver si los sntomas desaparecen. Solicite atencin mdica de inmediato. Comunquese con el servicio de emergencias de su localidad (911 en los [place]). No conduzca por sus propios medios [employer]. Resumen El vrtigo es la sensacin de que usted o todo lo que lo rodea se mueve cuando en realidad eso no sucede. El vrtigo posicional benigno es el tipo de vrtigo ms comn. La causa de esta afeccin es el desplazamiento de los cristales de calcio del odo interno. Esto produce una alteracin en una zona del odo interno que ayuda al cerebro a percibir el movimiento y el equilibrio. Los sntomas incluyen prdida del equilibrio y cadas, sensacin de que usted o su entorno se mueve, nuseas y vmitos, y visin borrosa. Esta afeccin se puede diagnosticar en funcin de los sntomas, un examen fsico y pruebas de posicionamiento. Siga las instrucciones de seguridad que le haya dado el mdico y vaya a todas las visitas de seguimiento. Esto es importante. Esta informacin no tiene Theme park manager el consejo del mdico. Asegrese de hacerle al mdico cualquier pregunta que tenga. Document Revised: 10/11/2020 Document Reviewed:  10/11/2020 Elsevier Patient Education  2024 [employer].

## 2023-10-26 NOTE — Progress Notes (Signed)
Patient ID: Walter Phillips, male    DOB: Apr 04, 1961  MRN: [id number]  CC: Dizziness (Headaches, dizziness , losing balance X2 weeks/Episode of almost falling on stairs 2 weeks ago - stranger caught pt. Episode on 12/24 - 12/25 of dizziness & falling "feeling like I'm drunk"/Loose stools X2 days)   Subjective: Walter Phillips is a 62 y.o. male who presents for urgent care visit.   His concerns today include:  Patient with history of DM type II, HTN, HL, psoriasis, ED  AMN Language interpreter used during this encounter. #[id number], Miguel  Discussed the use of AI scribe software for clinical note transcription with the patient, who gave verbal consent to proceed.  History of Present Illness   The patient, with a history of hypertension, presents with a two-week history of dizziness and a recent onset of headache.  He experiences the dizziness when he leans forward, goes to stand up from a chair and when getting up from bed.  He also endorses some dizziness when he turns in bed.  Episodes lasts about 20 seconds.  But for those 20 seconds he feels like he staggers when he walks.  Drinks about 3 bottles of water daily.  Denies any black stools or blood in the stools.  No associated weakness or numbness in the face or the extremities.  No associated ringing in the ears, hearing changes. Started having headaches for the past 3 days.  It feels like a band around the head and associated with blurred vision.  No photophobia, nausea or vomiting.  He took Tylenol once for the headache a few days ago which did not help.  Today the headache is mild with a score of 1/10.   In addition to these symptoms, the patient also experienced diarrhea for two days but has since resolved. The patient has not been taking his blood pressure medications since the onset of the headache.     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 90 tablet 1   atorvastatin (LIPITOR) 80 MG tablet Take 1 tablet (80 mg total) by mouth daily. 90 tablet 3   Blood Glucose Monitoring Suppl (TRUE METRIX METER) w/Device KIT Use to measure blood sugar twice a day 1 kit 0   clobetasol cream (TEMOVATE) 0.05 % Apply 1 application topically to the affected area(s) 2 (two) times daily. 30 g 1   dapagliflozin propanediol (FARXIGA) 10 MG TABS tablet Take 1 tablet (10 mg total) by mouth daily before breakfast. 30 tablet 3   Dulaglutide (TRULICITY) 4.5 MG/0.5ML SOAJ Inject 4.5 mg as directed once a week. 2 mL 3   fenofibrate (TRICOR) 145 MG tablet Take 1 tablet (145 mg total) by mouth once daily. 60 tablet 1   glucose blood (TRUE METRIX BLOOD GLUCOSE TEST) test strip Use  as instructed 100 each 12   metFORMIN (GLUCOPHAGE) 1000 MG tablet Take 1 tablet (1,000 mg total) by mouth 2 (two) times daily with a meal. 60 tablet 2   olopatadine (PATANOL) 0.1 % ophthalmic solution Place 1 drop into the left eye 2 (two) times daily. 5 mL 0   omeprazole (PRILOSEC) 40 MG capsule Take 1 capsule (40 mg total) by mouth daily. 30 capsule 3   tadalafil (CIALIS) 10 MG tablet Take 1 tablet (10 mg total) by mouth every other day as needed for erectile dysfunction. 10 tablet 1   TRUEplus Lancets 28G MISC Use to measure blood sugar twice a  day 100 each 1   valsartan (DIOVAN) 160 MG tablet Take 1 tablet (160 mg total) by mouth daily. 90 tablet 1   No current facility-administered medications on file prior to visit.    No Known Allergies  Social History   Socioeconomic History   Marital status: Married    Spouse name: Not on file   Number of children: Not on file   Years of education: Not on file   Highest education level: Not on file  Occupational History   Not on file  Tobacco Use   Smoking status: Former    Current packs/day: 0.50    Types: Cigarettes   Smokeless tobacco: Never   Tobacco comments:    Few a cigarettes a week years ago.   Vaping Use   Vaping status: Never Used  Substance and Sexual Activity   Alcohol use: Not Currently    Comment: occ   Drug use: No   Sexual activity: Not on file  Other Topics Concern   Not on file  Social History Narrative   Lives with wife.    Social Drivers of Health   Financial Resource Strain: Medium Risk (10/09/2023)   Overall Financial Resource Strain (CARDIA)    Difficulty of Paying Living Expenses: Somewhat hard  Food Insecurity: No Food Insecurity (10/09/2023)   Hunger Vital Sign    Worried About Running Out of Food in the Last Year: Never true    Ran Out of Food in the Last Year: Never true  Transportation Needs: No Transportation Needs (10/09/2023)   PRAPARE - Administrator, Civil Service (Medical): No    Lack of Transportation (Non-Medical): No  Physical Activity: Insufficiently Active (10/09/2023)   Exercise Vital Sign    Days of Exercise per Week: 3 days    Minutes of Exercise per Session: 30 min  Stress: No Stress Concern Present (10/09/2023)   [employer] of Occupational Health - Occupational Stress Questionnaire    Feeling of Stress : Not at all  Social Connections: Socially Integrated (10/09/2023)   Social Connection and Isolation Panel [NHANES]    Frequency of Communication with Friends and Family: More than three times a  week    Frequency of Social Gatherings with Friends and Family: Twice a week    Attends Religious Services: More than 4 times per year    Active Member of [employer] or Organizations: Yes    Attends Engineer, structural: More than 4 times per year    Marital Status: Married  Catering manager Violence: Not At Risk (02/08/2023)   Humiliation, Afraid, Rape, and Kick questionnaire    Fear of Current or Ex-Partner: No    Emotionally Abused: No    Physically Abused: No    Sexually Abused: No    Family History  Problem Relation Age of Onset   Early  death Mother        childbirth   Cancer Father    Stomach cancer Sister 76   Stroke Sister    Diabetes Daughter     Past Surgical History:  Procedure Laterality Date   APPENDECTOMY     LAPAROSCOPIC CHOLECYSTECTOMY     LAPAROSCOPIC SIGMOID COLECTOMY     VENTRAL HERNIA REPAIR  05/27/2014    ROS: Review of Systems Negative except as stated above  PHYSICAL EXAM: BP (!) 149/77 (BP Location: Left Arm, Patient Position: Sitting, Cuff Size: Normal)   Temp 97.9 F (36.6 C) (Oral)   Ht 5\' 2"  (1.575 m)   Wt 160 lb (72.6 kg)   SpO2 98%   BMI 29.26 kg/m   Wt Readings from Last 3 Encounters:  10/26/23 160 lb (72.6 kg)  10/09/23 156 lb (70.8 kg)  03/28/23 157 lb (71.2 kg)  Sitting: BP 179/99, P58 Standing BP: 181/94, P59  Physical Exam   General appearance - alert, well appearing, elderly Hispanic male and in no distress Mental status - normal mood, behavior, speech, dress, motor activity, and thought processes Neck -no carotid bruits Mouth: Oral mucosa moist Chest - clear to auscultation, no wheezes, rales or rhonchi, symmetric air entry Heart - normal rate, regular rhythm, normal S1, S2, no murmurs, rubs, clicks or gallops Neurological - cranial nerves II through XII intact, Romberg sign negative, normal gait and station power in the extremities 5/5 bilaterally in both upper and lower.  He has mild muscle wasting of the  intrinsic muscles of the right hand mainly between the thumb and the index finger.  Gross sensation intact. Attempted Dix-Hallpike maneuver.  No nystagmus noted.  Patient reported mild dizziness Extremities - peripheral pulses normal, no pedal edema, no clubbing or cyanosis     Latest Ref Rng & Units 10/09/2023    9:30 AM 08/23/2022   10:15 AM 12/27/2021    8:58 AM  CMP  Glucose 70 - 99 mg/dL 696  295  284   BUN 8 - 27 mg/dL 17  13  23    Creatinine 0.76 - 1.27 mg/dL 1.32  4.40  1.02   Sodium 134 - 144 mmol/L 144  137  142   Potassium 3.5 - 5.2 mmol/L 4.7  4.5  4.2   Chloride 96 - 106 mmol/L 102  97  104   CO2 20 - 29 mmol/L 25  25  25    Calcium 8.6 - 10.2 mg/dL 72.5  9.8  9.5   Total Protein 6.0 - 8.5 g/dL 8.1  7.9  7.3   Total Bilirubin 0.0 - 1.2 mg/dL 1.3  1.1  0.9   Alkaline Phos 44 - 121 IU/L 150  148  143   AST 0 - 40 IU/L 22  26  20    ALT 0 - 44 IU/L 24  37  24    Lipid Panel     Component Value Date/Time   CHOL 151 08/20/2023 0914   TRIG 228 (H) 08/20/2023 0914   HDL 35 (L) 08/20/2023 0914   CHOLHDL 4.3 08/20/2023 0914   LDLCALC 78 08/20/2023 0914    CBC    Component Value Date/Time   WBC 5.1 05/19/2020 1005   WBC 6.8 11/07/2019 1620   RBC 5.23 05/19/2020 1005   RBC 4.61 11/07/2019 1620   HGB 15.6 05/19/2020 1005   HCT 47.1 05/19/2020 1005   PLT 199 05/19/2020 1005   MCV 90 05/19/2020 1005   MCH 29.8 05/19/2020 1005  MCH 30.4 11/07/2019 1620   MCHC 33.1 05/19/2020 1005   MCHC 32.3 11/07/2019 1620   RDW 13.9 05/19/2020 1005   LYMPHSABS 1.5 05/19/2020 1005   MONOABS 0.2 10/20/2019 0222   EOSABS 0.2 05/19/2020 1005   BASOSABS 0.0 05/19/2020 1005    ASSESSMENT AND PLAN: 1. Benign paroxysmal positional vertigo, unspecified laterality (Primary) History suggest BPPV. Advised patient to go slow with position changes.  Stay hydrated by drinking 4 to 8 glasses of water daily. Referred to physical therapy for vestibular training. we will try low-dose of  Antivert for short period of time - Ambulatory referral to Physical Therapy - meclizine (ANTIVERT) 12.5 MG tablet; Take 1 tablet (12.5 mg total) by mouth daily as needed for dizziness.  Dispense: 10 tablet; Refill: 0 - CBC - Basic Metabolic Panel  2. New onset headache New onset for the past 3 days but better today.  Exam does not suggest cerebellar stroke but we will get CAT scan of the head nonetheless for new onset headache given his age.  Advised patient to restart his blood pressure medications as blood pressure is quite elevated. - CT HEAD WO CONTRAST ( ); Future  3. Essential hypertension Not at goal. See #2 above - CBC - Basic Metabolic Panel  F/U if no improvement or any worsening  Patient was given the opportunity to ask questions.  Patient verbalized understanding of the plan and was able to repeat key elements of the plan.   This documentation was completed using Paediatric nurse.  Any transcriptional errors are unintentional.  Orders Placed This Encounter  Procedures   CT HEAD WO CONTRAST ( )   CBC   Basic Metabolic Panel   Ambulatory referral to Physical Therapy     Requested Prescriptions   Signed Prescriptions Disp Refills   meclizine (ANTIVERT) 12.5 MG tablet 10 tablet 0    Sig: Take 1 tablet (12.5 mg total) by mouth daily as needed for dizziness.    No follow-ups on file.  Jonah Blue, MD, FACP

## 2023-10-27 LAB — BASIC METABOLIC PANEL
BUN/Creatinine Ratio: 23 (ref 10–24)
BUN: 12 mg/dL (ref 8–27)
CO2: 24 mmol/L (ref 20–29)
Calcium: 9.3 mg/dL (ref 8.6–10.2)
Chloride: 102 mmol/L (ref 96–106)
Creatinine, Ser: 0.53 mg/dL — ABNORMAL LOW (ref 0.76–1.27)
Glucose: 141 mg/dL — ABNORMAL HIGH (ref 70–99)
Potassium: 4.1 mmol/L (ref 3.5–5.2)
Sodium: 142 mmol/L (ref 134–144)
eGFR: 112 mL/min/{1.73_m2} (ref >59)

## 2023-10-27 LAB — CBC
Hematocrit: 46.6 % (ref 37.5–51.0)
Hemoglobin: 16 g/dL (ref 13.0–17.7)
MCH: 31.1 pg (ref 26.6–33.0)
MCHC: 34.3 g/dL (ref 31.5–35.7)
MCV: 91 fL (ref 79–97)
Platelets: 233 10*3/uL (ref 150–450)
RBC: 5.14 x10E6/uL (ref 4.14–5.80)
RDW: 12.8 % (ref 11.6–15.4)
WBC: 5.5 10*3/uL (ref 3.4–10.8)

## 2023-10-30 ENCOUNTER — Ambulatory Visit (HOSPITAL_COMMUNITY)
Admission: RE | Admit: 2023-10-30 | Discharge: 2023-10-30 | Disposition: A | Discharge status: Home / Self Care | Payer: Self-pay | Source: Ambulatory Visit | Attending: Internal Medicine | Admitting: Internal Medicine

## 2023-10-30 DIAGNOSIS — R519 Headache, unspecified: Secondary | ICD-10-CM | POA: Insufficient documentation

## 2023-11-15 ENCOUNTER — Ambulatory Visit: Payer: Self-pay | Admitting: Physical Therapy

## 2023-11-21 ENCOUNTER — Ambulatory Visit: Payer: Self-pay | Attending: Internal Medicine | Admitting: Physical Therapy

## 2023-11-21 VITALS — BP 147/82 | HR 57

## 2023-11-21 DIAGNOSIS — R42 Dizziness and giddiness: Secondary | ICD-10-CM | POA: Insufficient documentation

## 2023-11-21 DIAGNOSIS — H8110 Benign paroxysmal vertigo, unspecified ear: Secondary | ICD-10-CM | POA: Insufficient documentation

## 2023-11-21 NOTE — Therapy (Signed)
OUTPATIENT PHYSICAL THERAPY VESTIBULAR EVALUATION     Patient Name: Walter Phillips MRN: [id number] DOB:09/05/1961, 63 y.o., male Today's Date: 11/21/2023  END OF SESSION:  PT End of Session - 11/21/23 0847     Visit Number 1    Number of Visits 2    Date for PT Re-Evaluation 12/21/23    Authorization Type Self Pay    PT Start Time 0845    PT Stop Time 0926    PT Time Calculation (min) 41 min    Activity Tolerance Patient tolerated treatment well    Behavior During Therapy [facility] for tasks assessed/performed             Past Medical History:  Diagnosis Date   Chronic respiratory failure with hypoxia (HCC) 10/15/2019   Diabetes mellitus, type II (HCC)    Dyslipidemia    Hypertension    Past Surgical History:  Procedure Laterality Date   APPENDECTOMY     LAPAROSCOPIC CHOLECYSTECTOMY     LAPAROSCOPIC SIGMOID COLECTOMY     VENTRAL HERNIA REPAIR  05/27/2014   Patient Active Problem List   Diagnosis Date Noted   Mild cataract 04/13/2022   Hypertensive retinopathy of both eyes, grade 1 04/13/2022   OA (osteoarthritis) of knee 06/13/2021   GERD (gastroesophageal reflux disease) 06/13/2021   Hypertriglyceridemia 02/10/2021   Type 2 diabetes mellitus with complication, without long-term current use of insulin (HCC) 02/09/2021   Psoriasis 11/11/2020   HTN (hypertension) 06/22/2020   Erectile dysfunction and diabetes 06/22/2020   Postinflammatory pulmonary fibrosis (HCC) 12/02/2019   History of COVID-19 10/15/2019   Dyslipidemia 10/15/2019   Ventral hernia 04/17/2014    PCP: Storm Frisk, MD  REFERRING PROVIDER: Marcine Matar, MD  REFERRING DIAG: H81.10 (ICD-10-CM) - Benign paroxysmal positional vertigo, unspecified laterality  THERAPY DIAG:  Dizziness and giddiness  ONSET DATE: 10/26/2023  Rationale for Evaluation and Treatment: Rehabilitation  SUBJECTIVE:   SUBJECTIVE STATEMENT: One month ago at the end of December he got dizzy and fell down. Went  to urgent care and was prescribed Meclizine. Only took it for 2 days, not taking it anymore. Had a CT of his head which was negative. 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 When he wakes up and gets out of the bed, and when he is standing and he turns. Reports he is not that bad, but that is when he feels it.   Pt accompanied by: self and interpreter, Luis  PERTINENT HISTORY: PMH: HTN, Diabetes type II   Per Dr. Laural Benes on 10/26/23: presents with a two-week history of dizziness and a recent onset of headache.  He experiences the dizziness when he leans forward, goes to stand up from a chair and when getting up from bed.  He also endorses some dizziness when he turns in bed.  Episodes lasts about 20 seconds.  But for those 20 seconds he feels like he staggers when he walks.   PAIN:  Are you having pain? No  Vitals:   11/21/23 0904  BP: (!) 147/82  Pulse: (!) 57     PRECAUTIONS: Fall  WEIGHT BEARING RESTRICTIONS: No  FALLS: Has patient fallen in last 6 months? No and pt reports he almost had a fall, but someone caught him   PLOF: Independent and Vocation/Vocational requirements: Works full time with furniture, has a very active job.   PATIENT GOALS: Wants to feel better, doesn't want the dizziness  to come back   OBJECTIVE:  Note: Objective measures were completed at Evaluation unless otherwise noted.  DIAGNOSTIC FINDINGS: CT head 11/10/23: IMPRESSION: No acute intracranial process. No etiology is seen for the patient's headache.  COGNITION: Overall cognitive status: Within functional limits for tasks assessed    Cervical ROM:    WFL    GAIT: Gait pattern: WFL and step through pattern Distance walked: Clinic distances  Assistive device utilized: None Level of assistance: Complete Independence Comments: No unsteadiness noted   VESTIBULAR ASSESSMENT:  GENERAL OBSERVATION: Ambulates in  independently with no AD.    SYMPTOM BEHAVIOR:  Subjective history: See above.   Non-Vestibular symptoms: headaches and little pain at forehead   Type of dizziness: Imbalance (Disequilibrium)  Frequency: Every couple days, but not everyday   Duration: Couple seconds  Aggravating factors: Induced by position change: supine to sit and Induced by motion: bending down to the ground, turning body quickly, and turning head quickly  Relieving factors:  Running and doing push-ups, exercising   Progression of symptoms: better  OCULOMOTOR EXAM:  Ocular Alignment: normal  Ocular ROM: No Limitations  Spontaneous Nystagmus: absent  Gaze-Induced Nystagmus: absent  Smooth Pursuits: intact  Saccades: intact   VESTIBULAR - OCULAR REFLEX:   Slow VOR: Normal  VOR Cancellation: Normal  Head-Impulse Test: HIT Right: negative HIT Left: positive, very mild with first rep, none with 2nd, pt reporting very little dizziness    POSITIONAL TESTING: Right Dix-Hallpike: no nystagmus Left Dix-Hallpike: no nystagmus Right Roll Test: no nystagmus Left Roll Test: no nystagmus Right Sidelying: no nystagmus Left Sidelying: no nystagmus and a little dizziness coming back up  MOTION SENSITIVITY:  Motion Sensitivity Quotient Intensity: 0 = none, 1 = Lightheaded, 2 = Mild, 3 = Moderate, 4 = Severe, 5 = Vomiting  Intensity  1. Sitting to supine "A very little bit"   2. Supine to L side 0  3. Supine to R side 0  4. Supine to sitting 0  5. L Hallpike-Dix 0  6. Up from L  0  7. R Hallpike-Dix 0  8. Up from R  0  9. Sitting, head tipped to L knee   10. Head up from L knee   11. Sitting, head tipped to R knee   12. Head up from R knee   13. Sitting head turns x5   14.Sitting head nods x5   15. In stance, 180 turn to L    16. In stance, 180 turn to R                                                                                                                                TREATMENT DATE:  11/21/23  Habituation:  Brandt-Daroff: number of reps: 2 to each side, pt only with very mild dizziness with return to upright    PATIENT EDUCATION: Education details: Clinical findings, POC, Austin Miles exercises for habituation, pt negative for BPPV,  pt reporting he stopped taking his BP medication - educated that pt needs to be taking as prescribed by his PCP and if any questions on how he should be taking, should ask his PCP, discussed risks of not taking BP meds and uncontrolled BP Person educated: Patient Education method: Explanation, Verbal cues, and Handouts Education comprehension: verbalized understanding and returned demonstration  HOME EXERCISE PROGRAM: Access Code: [email or web address] URL: [email or web address] Date: 11/21/2023 Prepared by: Sherlie Ban  Exercises - Brandt-Daroff Vestibular Exercise  - 2 x daily - 7 x weekly - 4-5 reps    GOALS: Goals reviewed with patient? Yes  SHORT TERM GOALS: ALL STGS = LTGS  LONG TERM GOALS: Target date: 12/19/2023  Pt will report no dizziness with bed mobility.  Baseline:  Pt with dizziness with return to upright. Goal status: INITIAL    ASSESSMENT:  CLINICAL IMPRESSION: Patient is a 63 year old male referred to Neuro OPPT for dizziness/BPPV.   Pt's PMH is significant for: HTN, Diabetes type II. The following deficits were present during the exam: dizziness with bed mobility from sidelying <> sit. Pt negative for BPPV and oculomotor testing normal. Pt now reporting only very mild dizziness. Provided pt with Austin Miles exercises for home for habituation. Pt would like to schedule one more follow up appt just to make sure his dizziness is gone.  Pt would benefit from skilled PT to address these impairments and functional limitations to maximize functional mobility independence and decr dizziness.    OBJECTIVE IMPAIRMENTS: dizziness.   ACTIVITY LIMITATIONS: bending and bed mobility  PARTICIPATION LIMITATIONS:   N/A, pt can participate in activities, but still has very mild dizziness   PERSONAL FACTORS: Behavior pattern, Past/current experiences, and 1-2 comorbidities: HTN, Diabetes type II   are also affecting patient's functional outcome.   REHAB POTENTIAL: Excellent  CLINICAL DECISION MAKING: Stable/uncomplicated  EVALUATION COMPLEXITY: Low   PLAN:  PT FREQUENCY: 1x/week  PT DURATION: 4 weeks  PLANNED INTERVENTIONS: 97110-Therapeutic exercises, 97530- Therapeutic activity, [id number]- Neuromuscular re-education, 97535- Self Care, 16109- Manual therapy, [phone]- Canalith repositioning, Patient/Family education, and Vestibular training  PLAN FOR NEXT SESSION: how were brandt daroff exercises? D/C if pt not having dizziness anymore    Drake Leach, PT, DPT 11/21/2023, 11:17 AM

## 2023-11-28 ENCOUNTER — Ambulatory Visit: Payer: Self-pay | Admitting: Physical Therapy

## 2023-11-28 ENCOUNTER — Encounter: Payer: Self-pay | Admitting: Physical Therapy

## 2023-11-28 VITALS — BP 143/76 | HR 65

## 2023-11-28 DIAGNOSIS — R42 Dizziness and giddiness: Secondary | ICD-10-CM

## 2023-11-28 NOTE — Therapy (Signed)
OUTPATIENT PHYSICAL THERAPY VESTIBULAR TREATMENT     Patient Name: Walter Phillips MRN: [id number] DOB:05/25/1961, 63 y.o., male Today's Date: 11/28/2023  END OF SESSION:  PT End of Session - 11/28/23 0843     Visit Number 2    Number of Visits 2    Date for PT Re-Evaluation 12/21/23    Authorization Type Self Pay    PT Start Time 0842    PT Stop Time 0925    PT Time Calculation (min) 43 min    Activity Tolerance Patient tolerated treatment well    Behavior During Therapy [facility] for tasks assessed/performed             Past Medical History:  Diagnosis Date   Chronic respiratory failure with hypoxia (HCC) 10/15/2019   Diabetes mellitus, type II (HCC)    Dyslipidemia    Hypertension    Past Surgical History:  Procedure Laterality Date   APPENDECTOMY     LAPAROSCOPIC CHOLECYSTECTOMY     LAPAROSCOPIC SIGMOID COLECTOMY     VENTRAL HERNIA REPAIR  05/27/2014   Patient Active Problem List   Diagnosis Date Noted   Mild cataract 04/13/2022   Hypertensive retinopathy of both eyes, grade 1 04/13/2022   OA (osteoarthritis) of knee 06/13/2021   GERD (gastroesophageal reflux disease) 06/13/2021   Hypertriglyceridemia 02/10/2021   Type 2 diabetes mellitus with complication, without long-term current use of insulin (HCC) 02/09/2021   Psoriasis 11/11/2020   HTN (hypertension) 06/22/2020   Erectile dysfunction and diabetes 06/22/2020   Postinflammatory pulmonary fibrosis (HCC) 12/02/2019   History of COVID-19 10/15/2019   Dyslipidemia 10/15/2019   Ventral hernia 04/17/2014    PCP: Storm Frisk, MD  REFERRING PROVIDER: Marcine Matar, MD  REFERRING DIAG: H81.10 (ICD-10-CM) - Benign paroxysmal positional vertigo, unspecified laterality  THERAPY DIAG:  Dizziness and giddiness  ONSET DATE: 10/26/2023  Rationale for Evaluation and Treatment: Rehabilitation  SUBJECTIVE:   SUBJECTIVE STATEMENT: Was walking and felt dizziness and it had happened so fast and it felt  like he was going from one side to the other. This happened on Monday only. Didn't lose his balance completely. Feels like the dizziness is getting better. Has been doing the exercises and reports that they have been helping the dizziness. No dizziness when getting out of bed.   Pt accompanied by: self and in person interpreter  PERTINENT HISTORY: PMH: HTN, Diabetes type II   Per Dr. Laural Benes on 10/26/23: presents with a two-week history of dizziness and a recent onset of headache.  He experiences the dizziness when he leans forward, goes to stand up from a chair and when getting up from bed.  He also endorses some dizziness when he turns in bed.  Episodes lasts about 20 seconds.  But for those 20 seconds he feels like he staggers when he walks.   PAIN:  Are you having pain? No  Vitals:   11/28/23 0900  BP: (!) 143/76  Pulse: 65    PRECAUTIONS: Fall  WEIGHT BEARING RESTRICTIONS: No  FALLS: Has patient fallen in last 6 months? No and pt reports he almost had a fall, but someone caught him   PLOF: Independent and Vocation/Vocational requirements: Works full time with furniture, has a very active job.   PATIENT GOALS: Wants to feel better, doesn't want the dizziness to come back   OBJECTIVE:  Note: Objective measures were completed at Evaluation unless otherwise noted.  DIAGNOSTIC FINDINGS: CT head 11/10/23: IMPRESSION: No acute intracranial process. No etiology is seen  for the patient's headache.  COGNITION: Overall cognitive status: Within functional limits for tasks assessed    Cervical ROM:    WFL  GAIT: Gait pattern: WFL and step through pattern Distance walked: Clinic distances  Assistive device utilized: None Level of assistance: Complete Independence Comments: No unsteadiness noted   VESTIBULAR ASSESSMENT:  GENERAL OBSERVATION: Ambulates in independently with no AD.    SYMPTOM BEHAVIOR:  Subjective history: See above.   Non-Vestibular symptoms: headaches and  little pain at forehead   Type of dizziness: Imbalance (Disequilibrium)  Frequency: Every couple days, but not everyday   Duration: Couple seconds  Aggravating factors: Induced by position change: supine to sit and Induced by motion: bending down to the ground, turning body quickly, and turning head quickly  Relieving factors:  Running and doing push-ups, exercising   Progression of symptoms: better  OCULOMOTOR EXAM:  Ocular Alignment: normal  Ocular ROM: No Limitations  Spontaneous Nystagmus: absent  Gaze-Induced Nystagmus: absent  Smooth Pursuits: intact  Saccades: intact   VESTIBULAR - OCULAR REFLEX:   Slow VOR: Normal  VOR Cancellation: Normal  Head-Impulse Test: HIT Right: negative HIT Left: positive, very mild with first rep, none with 2nd, pt reporting very little dizziness    POSITIONAL TESTING: Right Dix-Hallpike: no nystagmus Left Dix-Hallpike: no nystagmus Right Roll Test: no nystagmus Left Roll Test: no nystagmus Right Sidelying: no nystagmus Left Sidelying: no nystagmus and a little dizziness coming back up  MOTION SENSITIVITY:  Motion Sensitivity Quotient Intensity: 0 = none, 1 = Lightheaded, 2 = Mild, 3 = Moderate, 4 = Severe, 5 = Vomiting  Intensity  1. Sitting to supine "A very little bit"   2. Supine to L side 0  3. Supine to R side 0  4. Supine to sitting 0  5. L Hallpike-Dix 0  6. Up from L  0  7. R Hallpike-Dix 0  8. Up from R  0  9. Sitting, head tipped to L knee   10. Head up from L knee   11. Sitting, head tipped to R knee   12. Head up from R knee   13. Sitting head turns x5   14.Sitting head nods x5   15. In stance, 180 turn to L    16. In stance, 180 turn to R                                                                                                                                TREATMENT DATE: 11/28/23  Answered all of pt's questions during session with in-person interpreter: Discussed etiology of pt's dizziness - PT  discussing likely that is was BPPV, but by the time that pt got to vestibular eval, it had resolved and pt still with lingering motion sensitivity with return to upright ans prescribed Austin Miles exercises for home. Pt has had an improvement in his dizziness since eval after doing his exercises and only had one instance  of dizziness during ambulation which only lasting 1 or 2 seconds. Have been unable to reproduce pt's dizziness during PT. Educated on etiology of BPPV and risk factors/reoccurrence. Discussed at this time, pt does not need skilled PT, but will keep pt's chart open for the next 30 days in case any spinning dizziness returns. Then pt can call back to make an appt. Discussed if dizziness does not return and it does in the future, then would need a new referral from his physician to return to PT. Pt verbalized understanding. Also educated to make sure that pt stays well hydrated and to make sure he is taking his BP medication as prescribed.   Vitals:   11/28/23 0900  BP: (!) 143/76  Pulse: 65    Gait with head turns 20', pt with no dizziness, reports feelings some off balance in the legs, 2nd time that pt performed, pt with no dizziness or unsteadiness  Gait with head nods 20', no dizziness or unsteadiness   Gaze Adaptation: x1 Viewing Horizontal: Position: Standing, Time: 30 seconds, Reps: 2, and Comment: with incr the speed the 2nd rep, no dizziness     PATIENT EDUCATION: Education details: See above for education.   Person educated: Patient Education method: Explanation, Verbal cues, and Handouts Education comprehension: verbalized understanding and returned demonstration  HOME EXERCISE PROGRAM: Access Code: [email or web address] URL: [email or web address] Date: 11/21/2023 Prepared by: Sherlie Ban  Exercises - Brandt-Daroff Vestibular Exercise  - 2 x daily - 7 x weekly - 4-5 reps    GOALS: Goals reviewed with patient? Yes  SHORT TERM GOALS: ALL STGS =  LTGS  LONG TERM GOALS: Target date: 12/19/2023  Pt will report no dizziness with bed mobility.  Baseline:  pt with no dizziness with bed mobility.  Goal status: MET    ASSESSMENT:  CLINICAL IMPRESSION: Since eval, pt only reports one very brief instance of dizziness for 1-2 seconds during gait at work. Otherwise, pt has had no episodes and has been doing his [employer] exercises. Educated all of pt's questions regarding etiology of BPPV and how it seemed like pt's diagnosis was BPPV and then it had resolved by the time he got to PT and had some lingering motion sensitivity and was given [employer] exercises. Discussed due to improvement in pt's dizziness, pt no longer needs skilled PT at this time. Pt in agreement with plan. Will hold pt's chart open for 30 days just in case.   OBJECTIVE IMPAIRMENTS: dizziness.   ACTIVITY LIMITATIONS: bending and bed mobility  PARTICIPATION LIMITATIONS:  N/A, pt can participate in activities, but still has very mild dizziness   PERSONAL FACTORS: Behavior pattern, Past/current experiences, and 1-2 comorbidities: HTN, Diabetes type II   are also affecting patient's functional outcome.   REHAB POTENTIAL: Excellent  CLINICAL DECISION MAKING: Stable/uncomplicated  EVALUATION COMPLEXITY: Low   PLAN:  PT FREQUENCY: 1x/week  PT DURATION: 4 weeks  PLANNED INTERVENTIONS: 97110-Therapeutic exercises, 97530- Therapeutic activity, [id number]- Neuromuscular re-education, 97535- Self Care, 16109- Manual therapy, [phone]- Canalith repositioning, Patient/Family education, and Vestibular training  PLAN FOR NEXT SESSION: will hold pt's chart open for the next month in case dizziness returns, otherwise D/C??    Drake Leach, PT, DPT 11/28/2023, 11:46 AM

## 2024-01-28 ENCOUNTER — Other Ambulatory Visit: Payer: Self-pay

## 2024-02-04 ENCOUNTER — Other Ambulatory Visit: Payer: Self-pay

## 2024-04-07 ENCOUNTER — Ambulatory Visit: Payer: Self-pay | Admitting: Family Medicine

## 2024-04-08 ENCOUNTER — Encounter: Payer: Self-pay | Admitting: Family Medicine

## 2024-04-08 ENCOUNTER — Other Ambulatory Visit: Payer: Self-pay

## 2024-04-08 ENCOUNTER — Ambulatory Visit: Payer: Self-pay | Attending: Family Medicine | Admitting: Family Medicine

## 2024-04-08 VITALS — BP 124/72 | HR 66 | Ht 62.0 in | Wt 152.0 lb

## 2024-04-08 DIAGNOSIS — E782 Mixed hyperlipidemia: Secondary | ICD-10-CM

## 2024-04-08 DIAGNOSIS — Z1211 Encounter for screening for malignant neoplasm of colon: Secondary | ICD-10-CM

## 2024-04-08 DIAGNOSIS — E118 Type 2 diabetes mellitus with unspecified complications: Secondary | ICD-10-CM

## 2024-04-08 DIAGNOSIS — Z7984 Long term (current) use of oral hypoglycemic drugs: Secondary | ICD-10-CM

## 2024-04-08 DIAGNOSIS — Z7985 Long-term (current) use of injectable non-insulin antidiabetic drugs: Secondary | ICD-10-CM

## 2024-04-08 DIAGNOSIS — Z125 Encounter for screening for malignant neoplasm of prostate: Secondary | ICD-10-CM

## 2024-04-08 DIAGNOSIS — E1142 Type 2 diabetes mellitus with diabetic polyneuropathy: Secondary | ICD-10-CM

## 2024-04-08 LAB — POCT GLYCOSYLATED HEMOGLOBIN (HGB A1C): HbA1c, POC (controlled diabetic range): 7.9 % — AB (ref 0.0–7.0)

## 2024-04-08 MED ORDER — GABAPENTIN 300 MG PO CAPS
300.0000 mg | ORAL_CAPSULE | Freq: Every day | ORAL | 1 refills | Status: AC
  Filled 2024-04-08: qty 90, 90d supply, fill #0

## 2024-04-08 MED ORDER — GLIPIZIDE ER 2.5 MG PO TB24
2.5000 mg | ORAL_TABLET | Freq: Every day | ORAL | 1 refills | Status: DC
  Filled 2024-04-08: qty 90, 90d supply, fill #0
  Filled 2024-09-02: qty 83, 83d supply, fill #1

## 2024-04-08 NOTE — Patient Instructions (Signed)
 VISIT SUMMARY:  Today, you were seen for cramping and numbness in your right toe. We discussed your diabetes management and made some adjustments to your medications. We also talked about the importance of routine health screenings.  YOUR PLAN:  -TYPE 2 DIABETES MELLITUS: Type 2 diabetes is a condition where your body does not use insulin  properly, leading to high blood sugar levels. Your recent A1c level is 7.9%, which indicates that your blood sugar is not well controlled. We are adding glipizide 2.5 mg once daily to help lower your blood sugar. We also ordered blood tests to check your kidney and liver function, as well as your cholesterol levels.  -DIABETIC NEUROPATHY: Diabetic neuropathy is nerve damage caused by high blood sugar levels, leading to symptoms like numbness and cramping. You are experiencing intermittent numbness in your right second toe, likely due to this condition. We are prescribing gabapentin to be taken once daily at night to help manage these symptoms.  -GENERAL HEALTH MAINTENANCE: Routine health screenings are important to catch any potential issues early. Since you are over 50, you need a colon cancer screening. We have ordered a stool test for this purpose. Please also make sure to pick up your medication refills.  INSTRUCTIONS:  Please follow up with the blood tests for kidney, liver function, and cholesterol as soon as possible. Make sure to take your new medication, glipizide, as prescribed. Take gabapentin at night to help with the numbness in your toe. Complete the stool test for colon cancer screening. Ensure you pick up your medication refills. Follow up with us  if you have any questions or concerns.

## 2024-04-08 NOTE — Progress Notes (Signed)
 Subjective:  Patient ID: Walter Phillips, male    DOB: 24-Dec-1960  Age: 63 y.o. MRN: [id number]  CC: Medical Management of Chronic Issues (Cramping(r) toe)     Discussed the use of AI scribe software for clinical note transcription with the patient, who gave verbal consent to proceed.  History of Present Illness Walter Phillips is a 63 year old male with type II diabetes, hyperlipidemia who presents with right toe cramping and numbness.  He experiences intermittent cramping and numbness in the second toe of his right foot for the past couple of weeks. The numbness is not constant.  He manages diabetes with metformin , Farxiga , and Trulicity , with a recent A1c of 7.9.  Endorses adhering to his atorvastatin  as well as fenofibrate .   He has not eaten today and has not taken his medications.    Past Medical History:  Diagnosis Date   Chronic respiratory failure with hypoxia (HCC) 10/15/2019   Diabetes mellitus, type II (HCC)    Dyslipidemia    Hypertension     Past Surgical History:  Procedure Laterality Date   APPENDECTOMY     LAPAROSCOPIC CHOLECYSTECTOMY     LAPAROSCOPIC SIGMOID COLECTOMY     VENTRAL HERNIA REPAIR  05/27/2014    Family History  Problem Relation Age of Onset   Early death Mother        childbirth   Cancer Father    Stomach cancer Sister 40   Stroke Sister    Diabetes Daughter     Social History   Socioeconomic History   Marital status: Married    Spouse name: Not on file   Number of children: Not on file   Years of education: Not on file   Highest education level: Not on file  Occupational History   Not on file  Tobacco Use   Smoking status: Former    Current packs/day: 0.50    Types: Cigarettes   Smokeless tobacco: Never   Tobacco comments:    Few a cigarettes a week years ago.   Vaping Use   Vaping status: Never Used  Substance and Sexual Activity   Alcohol use: Not Currently    Comment: occ   Drug use: No   Sexual activity: Not on file   Other Topics Concern   Not on file  Social History Narrative   Lives with wife.    Social Drivers of Health   Financial Resource Strain: Medium Risk (10/09/2023)   Overall Financial Resource Strain (CARDIA)    Difficulty of Paying Living Expenses: Somewhat hard  Food Insecurity: No Food Insecurity (10/09/2023)   Hunger Vital Sign    Worried About Running Out of Food in the Last Year: Never true    Ran Out of Food in the Last Year: Never true  Transportation Needs: No Transportation Needs (10/09/2023)   PRAPARE - Administrator, Civil Service (Medical): No    Lack of Transportation (Non-Medical): No  Physical Activity: Insufficiently Active (10/09/2023)   Exercise Vital Sign    Days of Exercise per Week: 3 days    Minutes of Exercise per Session: 30 min  Stress: No Stress Concern Present (10/09/2023)   [employer] of Occupational Health - Occupational Stress Questionnaire    Feeling of Stress : Not at all  Social Connections: Socially Integrated (10/09/2023)   Social Connection and Isolation Panel [NHANES]    Frequency of Communication with Friends and Family: More than three times a week    Frequency of Social  Gatherings with Friends and Family: Twice a week    Attends Religious Services: More than 4 times per year    Active Member of Clubs or Organizations: Yes    Attends Engineer, structural: More than 4 times per year    Marital Status: Married    No Known Allergies  Outpatient Medications Prior to Visit  Medication Sig Dispense Refill   amLODipine  (NORVASC ) 10 MG tablet Take 1 tablet (10 mg total) by mouth daily. 90 tablet 1   atorvastatin  (LIPITOR) 80 MG tablet Take 1 tablet (80 mg total) by mouth daily. 90 tablet 3   Blood Glucose Monitoring Suppl (TRUE METRIX METER) w/Device KIT Use to measure blood sugar twice a day 1 kit 0   clobetasol  cream (TEMOVATE ) 0.05 % Apply 1 application topically to the affected area(s) 2 (two) times daily. 30 g  1   dapagliflozin  propanediol (FARXIGA ) 10 MG TABS tablet Take 1 tablet (10 mg total) by mouth daily before breakfast. 30 tablet 3   Dulaglutide  (TRULICITY ) 4.5 MG/0.5ML SOAJ Inject 4.5 mg as directed once a week. 2 mL 3   fenofibrate  (TRICOR ) 145 MG tablet Take 1 tablet (145 mg total) by mouth once daily. 60 tablet 1   glucose blood (TRUE METRIX BLOOD GLUCOSE TEST) test strip Use as instructed 100 each 12   meclizine  (ANTIVERT ) 12.5 MG tablet Take 1 tablet (12.5 mg total) by mouth daily as needed for dizziness. 10 tablet 0   metFORMIN  (GLUCOPHAGE ) 1000 MG tablet Take 1 tablet (1,000 mg total) by mouth 2 (two) times daily with a meal. 60 tablet 2   olopatadine  (PATANOL) 0.1 % ophthalmic solution Place 1 drop into the left eye 2 (two) times daily. 5 mL 0   omeprazole  (PRILOSEC) 40 MG capsule Take 1 capsule (40 mg total) by mouth daily. 30 capsule 3   tadalafil  (CIALIS ) 10 MG tablet Take 1 tablet (10 mg total) by mouth every other day as needed for erectile dysfunction. 10 tablet 1   TRUEplus Lancets 28G MISC Use to measure blood sugar twice a day 100 each 1   valsartan  (DIOVAN ) 160 MG tablet Take 1 tablet (160 mg total) by mouth daily. 90 tablet 1   No facility-administered medications prior to visit.     ROS Review of Systems  Constitutional:  Negative for activity change and appetite change.  HENT:  Negative for sinus pressure and sore throat.   Respiratory:  Negative for chest tightness, shortness of breath and wheezing.   Cardiovascular:  Negative for chest pain and palpitations.  Gastrointestinal:  Negative for abdominal distention, abdominal pain and constipation.  Genitourinary: Negative.   Musculoskeletal: Negative.   Neurological:  Positive for numbness.  Psychiatric/Behavioral:  Negative for behavioral problems and dysphoric mood.     Objective:  BP 124/72   Pulse 66   Ht 5\' 2"  (1.575 m)   Wt 152 lb (68.9 kg)   SpO2 99%   BMI 27.80 kg/m      04/08/2024    9:40 AM  11/28/2023    9:00 AM 11/21/2023    9:04 AM  BP/Weight  Systolic BP 124 143 147  Diastolic BP 72 76 82  Wt. (Lbs) 152    BMI 27.8 kg/m2        Physical Exam Constitutional:      Appearance: He is well-developed.  Cardiovascular:     Rate and Rhythm: Normal rate.     Heart sounds: Normal heart sounds. No murmur heard. Pulmonary:  Effort: Pulmonary effort is normal.     Breath sounds: Normal breath sounds. No wheezing or rales.  Chest:     Chest wall: No tenderness.  Abdominal:     General: Bowel sounds are normal. There is no distension.     Palpations: Abdomen is soft. There is no mass.     Tenderness: There is no abdominal tenderness.  Musculoskeletal:        General: Normal range of motion.     Right lower leg: No edema.     Left lower leg: No edema.  Neurological:     Mental Status: He is alert and oriented to person, place, and time.  Psychiatric:        Mood and Affect: Mood normal.        Latest Ref Rng & Units 10/26/2023   10:46 AM 10/09/2023    9:30 AM 08/23/2022   10:15 AM  CMP  Glucose 70 - 99 mg/dL 478  295  621   BUN 8 - 27 mg/dL 12  17  13    Creatinine 0.76 - 1.27 mg/dL 3.08  6.57  8.46   Sodium 134 - 144 mmol/L 142  144  137   Potassium 3.5 - 5.2 mmol/L 4.1  4.7  4.5   Chloride 96 - 106 mmol/L 102  102  97   CO2 20 - 29 mmol/L 24  25  25    Calcium  8.6 - 10.2 mg/dL 9.3  96.2  9.8   Total Protein 6.0 - 8.5 g/dL  8.1  7.9   Total Bilirubin 0.0 - 1.2 mg/dL  1.3  1.1   Alkaline Phos 44 - 121 IU/L  150  148   AST 0 - 40 IU/L  22  26   ALT 0 - 44 IU/L  24  37     Lipid Panel     Component Value Date/Time   CHOL 151 08/20/2023 0914   TRIG 228 (H) 08/20/2023 0914   HDL 35 (L) 08/20/2023 0914   CHOLHDL 4.3 08/20/2023 0914   LDLCALC 78 08/20/2023 0914    CBC    Component Value Date/Time   WBC 5.5 10/26/2023 1046   WBC 6.8 11/07/2019 1620   RBC 5.14 10/26/2023 1046   RBC 4.61 11/07/2019 1620   HGB 16.0 10/26/2023 1046   HCT 46.6  10/26/2023 1046   PLT 233 10/26/2023 1046   MCV 91 10/26/2023 1046   MCH 31.1 10/26/2023 1046   MCH 30.4 11/07/2019 1620   MCHC 34.3 10/26/2023 1046   MCHC 32.3 11/07/2019 1620   RDW 12.8 10/26/2023 1046   LYMPHSABS 1.5 05/19/2020 1005   MONOABS 0.2 10/20/2019 0222   EOSABS 0.2 05/19/2020 1005   BASOSABS 0.0 05/19/2020 1005    Lab Results  Component Value Date   HGBA1C 7.9 (A) 04/08/2024   Lab Results  Component Value Date   HGBA1C 7.9 (A) 04/08/2024   HGBA1C 7.3 (A) 10/09/2023   HGBA1C 7.3 (A) 07/19/2023       1. Type 2 diabetes mellitus with complication, without long-term current use of insulin  (HCC) (Primary) Suboptimally controlled with 1C of 7.9 Glipizide added to regimen Continue Trulicity , metformin , Farxiga  Counseled on Diabetic diet, my plate method, 952 minutes of moderate intensity exercise/week Blood sugar logs with fasting goals of 80-120 mg/dl, random of less than 841 and in the event of sugars less than 60 mg/dl or greater than 324 mg/dl encouraged to notify the clinic. Advised on the need for annual eye  exams, annual foot exams, Pneumonia vaccine. - POCT glycosylated hemoglobin (Hb A1C) - CMP14+EGFR - glipiZIDE (GLUCOTROL XL) 2.5 MG 24 hr tablet; Take 1 tablet (2.5 mg total) by mouth daily with breakfast.  Dispense: 90 tablet; Refill: 1 - Microalbumin / creatinine urine ratio - LP+Non-HDL Cholesterol  2. Screening for colon cancer - Fecal occult blood, imunochemical  3. Diabetic polyneuropathy associated with type 2 diabetes mellitus (HCC) Could explain his stool symptoms Will initiate gabapentin - gabapentin (NEURONTIN) 300 MG capsule; Take 1 capsule (300 mg total) by mouth at bedtime.  Dispense: 90 capsule; Refill: 1  4. Screening for prostate cancer - PSA, total and free  5. Long term current use of oral hypoglycemic drug - CMP14+EGFR  6. Mixed hyperlipidemia Continue atorvastatin  Low-cholesterol diet   Meds ordered this encounter   Medications   gabapentin (NEURONTIN) 300 MG capsule    Sig: Take 1 capsule (300 mg total) by mouth at bedtime.    Dispense:  90 capsule    Refill:  1   glipiZIDE (GLUCOTROL XL) 2.5 MG 24 hr tablet    Sig: Take 1 tablet (2.5 mg total) by mouth daily with breakfast.    Dispense:  90 tablet    Refill:  1    Follow-up: Return in about 6 months (around 10/08/2024) for Chronic medical conditions.       Joaquin Mulberry, MD, FAAFP. [facility] [place], [place] [phone]   04/08/2024, 11:11 AM

## 2024-04-10 LAB — CMP14+EGFR
ALT: 23 IU/L (ref 0–44)
AST: 21 IU/L (ref 0–40)
Albumin: 4.4 g/dL (ref 3.9–4.9)
Alkaline Phosphatase: 143 IU/L — ABNORMAL HIGH (ref 44–121)
BUN/Creatinine Ratio: 25 — ABNORMAL HIGH (ref 10–24)
BUN: 14 mg/dL (ref 8–27)
Bilirubin Total: 1.1 mg/dL (ref 0.0–1.2)
CO2: 19 mmol/L — ABNORMAL LOW (ref 20–29)
Calcium: 9.5 mg/dL (ref 8.6–10.2)
Chloride: 103 mmol/L (ref 96–106)
Creatinine, Ser: 0.55 mg/dL — ABNORMAL LOW (ref 0.76–1.27)
Globulin, Total: 2.9 g/dL (ref 1.5–4.5)
Glucose: 153 mg/dL — ABNORMAL HIGH (ref 70–99)
Potassium: 4.5 mmol/L (ref 3.5–5.2)
Sodium: 140 mmol/L (ref 134–144)
Total Protein: 7.3 g/dL (ref 6.0–8.5)
eGFR: 111 mL/min/{1.73_m2} (ref >59)

## 2024-04-10 LAB — LP+NON-HDL CHOLESTEROL
Cholesterol, Total: 153 mg/dL (ref 100–199)
HDL: 37 mg/dL — ABNORMAL LOW (ref >39)
LDL Chol Calc (NIH): 78 mg/dL (ref 0–99)
Total Non-HDL-Chol (LDL+VLDL): 116 mg/dL (ref 0–129)
Triglycerides: 229 mg/dL — ABNORMAL HIGH (ref 0–149)
VLDL Cholesterol Cal: 38 mg/dL (ref 5–40)

## 2024-04-10 LAB — MICROALBUMIN / CREATININE URINE RATIO
Creatinine, Urine: 91.6 mg/dL
Microalb/Creat Ratio: 56 mg/g{creat} — ABNORMAL HIGH (ref 0–29)
Microalbumin, Urine: 51.6 ug/mL

## 2024-04-10 LAB — PSA, TOTAL AND FREE
PSA, Free Pct: 44 %
PSA, Free: 0.22 ng/mL
Prostate Specific Ag, Serum: 0.5 ng/mL (ref 0.0–4.0)

## 2024-04-11 ENCOUNTER — Ambulatory Visit: Payer: Self-pay | Admitting: Family Medicine

## 2024-05-14 NOTE — Progress Notes (Signed)
 Walter Phillips is a 63 y.o.  with  reports that he has never smoked. He has never used smokeless tobacco. with  Active Ambulatory Problems    Diagnosis Date Noted  . No Active Ambulatory Problems   Resolved Ambulatory Problems    Diagnosis Date Noted  . No Resolved Ambulatory Problems   Past Medical History:  Diagnosis Date  . Diabetes type 2, controlled    (CMD)   . GERD (gastroesophageal reflux disease)   . Hyperlipemia   . Hypertension    who presents today at Urgent Care for  Chief Complaint  Patient presents with  . Rectal Bleeding    Reported having discomfort on his stomach. Reported  3 BM with dark blood on first, lighter blood  on 2nd, and 3rd a bit more lighter. Denies any rectal pain, or hemorrhoids. Denies constipation       History of Present Illness This is a 63 year old male presenting with discomfort in his stomach and blood in his stool.  The patient reports experiencing blood in his stool for approximately a year, with the most recent episode occurring on 05/13/2024 around 6:00 PM. He describes three bowel movements: the first with dark blood, the second lighter, and the third with a bit more and lighter blood. The stools were tarry, similar to coffee grounds, and mixed with diarrhea. He reports no rectal pain, hemorrhoids, or constipation. He does not take ibuprofen but uses Tylenol . He occasionally takes omeprazole  but does not use it daily. He does not experience any pain but has noticed increased motility or movement in the area. His diet includes a significant amount of fast food.      Patient has no co-morbidities  or medications that might increase the risk for developing a severe infection     reports that he has never smoked. He has never used smokeless tobacco.  Review of Systems  Review of systems is otherwise negative except as noted in the HPI and Assessment/MDM   Physical Exam  BP 133/88 (BP Location: Left arm, Patient Position: Sitting)    Pulse 74   Temp 97.8 F (36.6 C) (Tympanic)   Resp 16   Ht 1.575 m (5' 2)   Wt 74.4 kg (164 lb)   SpO2 97%   BMI 30.00 kg/m   Constitutional:      General: Patient is not in acute distress.    Appearance: Normal appearance.  Neurological:     General: No focal deficit present.     Mental Status: alert and oriented to person, place, and time.  HENT:     Eyes:     Conjunctiva/sclera: Conjunctivae normal. Pharynx normal    There is no associated anterior cervical lymphadenopathy    Pupils: Pupils are equal, round, and reactive to light.     TM's normal with no visible effusion  Cardiovascular:     Heart sounds: Normal heart sounds. No murmur heard.    No Lower extremity edema noted Pulmonary:     Effort: Pulmonary effort is normal. No respiratory distress.     Breath sounds: No wheezing, rhonchi or rales.  Abdominal:     General: Bowel sounds are normal. There is no distension.     Tenderness: There is no abdominal tenderness. Musculoskeletal:        General: Normal range of motion.     Neck:  No rigidity.  Skin:    General: Skin is warm and dry.  Psychiatric:        Mood  and Affect: Mood normal.   No orders to display            DIAGNOSIS/PLAN     1. Gastrointestinal hemorrhage associated with acute gastritis  Ambulatory referral to Gastroenterology   CBC with Differential      Assessment & Plan Initial Assessment: 63 year old male with discomfort in the stomach and hematochezia. Reports three bowel movements with dark to light blood. Denies rectal pain, hemorrhoids, and constipation.  Differential Diagnosis: - Ulcer: Dark stools suggest upper GI bleeding. Resume omeprazole . CBC to assess red blood cells. Referral to gastroenterologist for EGD. - Large bowel bleeding: Bright red blood indicates bleeding closer to rectum. Referral to gastroenterologist for colonoscopy.  ED Course: - CBC ordered. - Omeprazole  prescribed to be taken daily, 30 minutes  before first meal.  Final Assessment: CBC ordered to assess red blood cells. Omeprazole  prescribed for ulcer treatment. Referral to gastroenterologist for further evaluation including EGD and colonoscopy.  Clinical Impression: - Hematochezia - Possible ulcer - Possible large bowel bleeding  Disposition: - Follow-Up: Gastroenterologist referral for EGD and colonoscopy. Dietary modifications suggested.  Patient Education: Avoid spicy foods. Increase intake of vegetables, especially those high in fiber.  Portions of this note were created using the aid of voice recognition Dragon/DAX dictation software.   We discussed risks and side effects of medications, and also discussed red flags which would warrant immediate follow-up.   Urgent Care Disposition:  Home Care

## 2024-05-15 ENCOUNTER — Ambulatory Visit: Payer: Self-pay | Admitting: Internal Medicine

## 2024-06-25 ENCOUNTER — Other Ambulatory Visit: Payer: Self-pay | Admitting: Critical Care Medicine

## 2024-06-25 ENCOUNTER — Other Ambulatory Visit: Payer: Self-pay

## 2024-06-26 ENCOUNTER — Other Ambulatory Visit: Payer: Self-pay

## 2024-06-26 MED ORDER — TADALAFIL 10 MG PO TABS
10.0000 mg | ORAL_TABLET | ORAL | 2 refills | Status: AC | PRN
  Filled 2024-06-26: qty 10, 20d supply, fill #0
  Filled 2024-09-02: qty 10, 20d supply, fill #1
  Filled 2024-11-19: qty 10, 20d supply, fill #2

## 2024-06-26 NOTE — Telephone Encounter (Signed)
 Requested Prescriptions  Pending Prescriptions Disp Refills   tadalafil  (CIALIS ) 10 MG tablet 10 tablet 2    Sig: Take 1 tablet (10 mg total) by mouth every other day as needed for erectile dysfunction.     Urology: Erectile Dysfunction Agents Passed - 06/26/2024  5:07 PM      Passed - AST in normal range and within 360 days    AST  Date Value Ref Range Status  04/08/2024 21 0 - 40 IU/L Final         Passed - ALT in normal range and within 360 days    ALT  Date Value Ref Range Status  04/08/2024 23 0 - 44 IU/L Final         Passed - Last BP in normal range    BP Readings from Last 1 Encounters:  04/08/24 124/72         Passed - Valid encounter within last 12 months    Recent Outpatient Visits           2 months ago Type 2 diabetes mellitus with complication, without long-term current use of insulin  (HCC)   [facility] Dept Of [place]. [facility] Delbert Clam, MD   8 months ago Benign paroxysmal positional vertigo, unspecified laterality   [facility] - A Dept Of [place]. [facility], [facility] Vicci Sober B, MD   8 months ago Type 2 diabetes mellitus with complication, without long-term current use of insulin  [facility])   [facility] - A Dept Of [place]. [facility] Brien Belvie BRAVO, MD   10 months ago Dyslipidemia   [facility] - A Dept Of [place]. [facility] Fleeta Morris, [place] L, RPH-CPP   11 months ago Type 2 diabetes mellitus with complication, without long-term current use of insulin  [facility])   [facility] - A Dept Of . [facility] Fleeta Morris Garnette LITTIE, RPH-CPP

## 2024-06-27 ENCOUNTER — Other Ambulatory Visit: Payer: Self-pay

## 2024-08-15 ENCOUNTER — Other Ambulatory Visit: Payer: Self-pay

## 2024-09-02 ENCOUNTER — Other Ambulatory Visit: Payer: Self-pay | Admitting: Family Medicine

## 2024-09-02 ENCOUNTER — Other Ambulatory Visit: Payer: Self-pay

## 2024-09-02 DIAGNOSIS — E1165 Type 2 diabetes mellitus with hyperglycemia: Secondary | ICD-10-CM

## 2024-09-02 MED ORDER — METFORMIN HCL 1000 MG PO TABS
1000.0000 mg | ORAL_TABLET | Freq: Two times a day (BID) | ORAL | 0 refills | Status: AC
  Filled 2024-09-02: qty 180, 90d supply, fill #0

## 2024-09-02 MED ORDER — AMLODIPINE BESYLATE 10 MG PO TABS
10.0000 mg | ORAL_TABLET | Freq: Every day | ORAL | 0 refills | Status: DC
  Filled 2024-09-02: qty 90, 90d supply, fill #0

## 2024-09-03 ENCOUNTER — Other Ambulatory Visit: Payer: Self-pay

## 2024-09-04 ENCOUNTER — Other Ambulatory Visit: Payer: Self-pay

## 2024-10-08 ENCOUNTER — Ambulatory Visit: Payer: Self-pay | Attending: Family Medicine | Admitting: Family Medicine

## 2024-10-08 ENCOUNTER — Encounter: Payer: Self-pay | Admitting: Family Medicine

## 2024-10-08 ENCOUNTER — Other Ambulatory Visit: Payer: Self-pay

## 2024-10-08 VITALS — BP 138/68 | HR 66 | Temp 98.3°F | Ht 62.0 in | Wt 153.0 lb

## 2024-10-08 DIAGNOSIS — E785 Hyperlipidemia, unspecified: Secondary | ICD-10-CM

## 2024-10-08 DIAGNOSIS — J329 Chronic sinusitis, unspecified: Secondary | ICD-10-CM

## 2024-10-08 DIAGNOSIS — Z23 Encounter for immunization: Secondary | ICD-10-CM

## 2024-10-08 DIAGNOSIS — E118 Type 2 diabetes mellitus with unspecified complications: Secondary | ICD-10-CM

## 2024-10-08 DIAGNOSIS — I10 Essential (primary) hypertension: Secondary | ICD-10-CM

## 2024-10-08 DIAGNOSIS — E1142 Type 2 diabetes mellitus with diabetic polyneuropathy: Secondary | ICD-10-CM

## 2024-10-08 DIAGNOSIS — Z7984 Long term (current) use of oral hypoglycemic drugs: Secondary | ICD-10-CM

## 2024-10-08 DIAGNOSIS — B351 Tinea unguium: Secondary | ICD-10-CM

## 2024-10-08 DIAGNOSIS — K219 Gastro-esophageal reflux disease without esophagitis: Secondary | ICD-10-CM

## 2024-10-08 LAB — POCT GLYCOSYLATED HEMOGLOBIN (HGB A1C): HbA1c, POC (controlled diabetic range): 8.6 % — AB (ref 0.0–7.0)

## 2024-10-08 MED ORDER — TERBINAFINE HCL 250 MG PO TABS
250.0000 mg | ORAL_TABLET | Freq: Every day | ORAL | 0 refills | Status: AC
  Filled 2024-10-08: qty 90, 90d supply, fill #0

## 2024-10-08 MED ORDER — GLIPIZIDE ER 5 MG PO TB24
5.0000 mg | ORAL_TABLET | Freq: Every day | ORAL | 1 refills | Status: AC
  Filled 2024-10-08: qty 90, 90d supply, fill #0

## 2024-10-08 MED ORDER — LEVOCETIRIZINE DIHYDROCHLORIDE 5 MG PO TABS
5.0000 mg | ORAL_TABLET | Freq: Every evening | ORAL | 1 refills | Status: AC
  Filled 2024-10-08: qty 30, 30d supply, fill #0

## 2024-10-08 MED ORDER — FLUTICASONE PROPIONATE 50 MCG/ACT NA SUSP
2.0000 | Freq: Every day | NASAL | 1 refills | Status: AC
  Filled 2024-10-08: qty 16, 25d supply, fill #0

## 2024-10-08 MED ORDER — AMLODIPINE BESYLATE 10 MG PO TABS
10.0000 mg | ORAL_TABLET | Freq: Every day | ORAL | 1 refills | Status: AC
  Filled 2024-10-08: qty 90, 90d supply, fill #0

## 2024-10-08 NOTE — Patient Instructions (Signed)
 VISIT SUMMARY:  Today, you were seen for brown morning sputum and foot itching. We also reviewed your diabetes management and noted an increase in your A1c level.  YOUR PLAN:  -TYPE 2 DIABETES MELLITUS WITH DIABETIC POLYNEUROPATHY: Your A1c level has increased to 8.6, which means your blood sugar control has worsened. This can lead to symptoms like numbness or tingling in your feet, known as diabetic polyneuropathy. We have increased your glipizide  dose to 5 mg daily and you should continue taking Trulicity , Farxiga , and metformin  as prescribed.  -ONYCHOMYCOSIS: Onychomycosis is a fungal infection of the nails, which can cause itching and discomfort. We have prescribed an oral antifungal medication to treat this condition.  -CHRONIC SINUSITIS: Chronic sinusitis is a long-term inflammation of the sinuses, which can cause symptoms like brown sputum. We have ordered a blood count to check for anemia or bleeding, and prescribed Xyzal  and a nasal spray to help manage your sinus symptoms.  -GENERAL HEALTH MAINTENANCE: You are due for a flu vaccination. Please schedule this at your earliest convenience.  INSTRUCTIONS:  Please follow up with us  after completing the nasal spray treatment or sooner if your symptoms worsen. Make sure to get your flu vaccination. Continue monitoring your blood sugar levels and take your medications as prescribed.

## 2024-10-08 NOTE — Progress Notes (Signed)
 Subjective:  Patient ID: Walter Phillips, male    DOB: 1961/05/04  Age: 63 y.o. MRN: [id number]  CC: Medical Management of Chronic Issues Luna discharge   from the mouth )     Discussed the use of AI scribe software for clinical note transcription with the patient, who gave verbal consent to proceed.  History of Present Illness Walter Phillips is a 63 year old male with hyperlipidemia, type II diabetes who presents with brown morning sputum and foot itching.  He has had brown sputum each morning for the past few weeks, mainly on waking and when brushing his teeth. The sputum is brown rather than yellow, with occasional mucus in the back of his throat. He has no abdominal pain, diarrhea, constipation, nausea, vomiting, or nasal congestion. He also takes omeprazole  for acid reflux. He has intermittent itching on the top of the toes of his right foot for 6 months, worse at night. He has no numbness or foot falling asleep.  He is on gabapentin  for diabetic neuropathy.  His A1c has increased from 7.9 to 8.6. He takes Trulicity , Farxiga , metformin , and glipizide .  Endorses adherence with his antihypertensive and his statin.   Past Medical History:  Diagnosis Date   Chronic respiratory failure with hypoxia (HCC) 10/15/2019   Diabetes mellitus, type II (HCC)    Dyslipidemia    Hypertension     Past Surgical History:  Procedure Laterality Date   APPENDECTOMY     LAPAROSCOPIC CHOLECYSTECTOMY     LAPAROSCOPIC SIGMOID COLECTOMY     VENTRAL HERNIA REPAIR  05/27/2014    Family History  Problem Relation Age of Onset   Early death Mother        childbirth   Cancer Father    Stomach cancer Sister 32   Stroke Sister    Diabetes Daughter     Social History   Socioeconomic History   Marital status: Married    Spouse name: Not on file   Number of children: Not on file   Years of education: Not on file   Highest education level: Not on file  Occupational History   Not on file   Tobacco Use   Smoking status: Former    Current packs/day: 0.50    Types: Cigarettes   Smokeless tobacco: Never   Tobacco comments:    Few a cigarettes a week years ago.   Vaping Use   Vaping status: Never Used  Substance and Sexual Activity   Alcohol use: Not Currently    Comment: occ   Drug use: No   Sexual activity: Not on file  Other Topics Concern   Not on file  Social History Narrative   Lives with wife.    Social Drivers of Health   Financial Resource Strain: Medium Risk (10/09/2023)   Overall Financial Resource Strain (CARDIA)    Difficulty of Paying Living Expenses: Somewhat hard  Food Insecurity: No Food Insecurity (10/09/2023)   Hunger Vital Sign    Worried About Running Out of Food in the Last Year: Never true    Ran Out of Food in the Last Year: Never true  Transportation Needs: No Transportation Needs (10/09/2023)   PRAPARE - Administrator, Civil Service (Medical): No    Lack of Transportation (Non-Medical): No  Physical Activity: Insufficiently Active (10/09/2023)   Exercise Vital Sign    Days of Exercise per Week: 3 days    Minutes of Exercise per Session: 30 min  Stress: No Stress Concern  Present (10/09/2023)   [employer] of Occupational Health - Occupational Stress Questionnaire    Feeling of Stress : Not at all  Social Connections: Socially Integrated (10/09/2023)   Social Connection and Isolation Panel    Frequency of Communication with Friends and Family: More than three times a week    Frequency of Social Gatherings with Friends and Family: Twice a week    Attends Religious Services: More than 4 times per year    Active Member of [employer] or Organizations: Yes    Attends Engineer, Structural: More than 4 times per year    Marital Status: Married    No Known Allergies  Outpatient Medications Prior to Visit  Medication Sig Dispense Refill   atorvastatin  (LIPITOR) 80 MG tablet Take 1 tablet (80 mg total) by mouth  daily. 90 tablet 3   Blood Glucose Monitoring Suppl (TRUE METRIX METER) w/Device KIT Use to measure blood sugar twice a day 1 kit 0   clobetasol  cream (TEMOVATE ) 0.05 % Apply 1 application topically to the affected area(s) 2 (two) times daily. 30 g 1   dapagliflozin  propanediol (FARXIGA ) 10 MG TABS tablet Take 1 tablet (10 mg total) by mouth daily before breakfast. 30 tablet 3   Dulaglutide  (TRULICITY ) 4.5 MG/0.5ML SOAJ Inject 4.5 mg as directed once a week. 2 mL 3   fenofibrate  (TRICOR ) 145 MG tablet Take 1 tablet (145 mg total) by mouth once daily. 60 tablet 1   gabapentin  (NEURONTIN ) 300 MG capsule Take 1 capsule (300 mg total) by mouth at bedtime. 90 capsule 1   glucose blood (TRUE METRIX BLOOD GLUCOSE TEST) test strip Use as instructed 100 each 12   meclizine  (ANTIVERT ) 12.5 MG tablet Take 1 tablet (12.5 mg total) by mouth daily as needed for dizziness. 10 tablet 0   metFORMIN  (GLUCOPHAGE ) 1000 MG tablet Take 1 tablet (1,000 mg total) by mouth 2 (two) times daily with a meal. 180 tablet 0   olopatadine  (PATANOL) 0.1 % ophthalmic solution Place 1 drop into the left eye 2 (two) times daily. 5 mL 0   omeprazole  (PRILOSEC) 40 MG capsule Take 1 capsule (40 mg total) by mouth daily. 30 capsule 3   tadalafil  (CIALIS ) 10 MG tablet Take 1 tablet (10 mg total) by mouth every other day as needed for erectile dysfunction. 10 tablet 2   TRUEplus Lancets 28G MISC Use to measure blood sugar twice a day 100 each 1   valsartan  (DIOVAN ) 160 MG tablet Take 1 tablet (160 mg total) by mouth daily. 90 tablet 1   amLODipine  (NORVASC ) 10 MG tablet Take 1 tablet (10 mg total) by mouth daily. 90 tablet 0   glipiZIDE  (GLUCOTROL  XL) 2.5 MG 24 hr tablet Take 1 tablet (2.5 mg total) by mouth daily with breakfast. 90 tablet 1   No facility-administered medications prior to visit.     ROS Review of Systems  Constitutional:  Negative for activity change and appetite change.  HENT:  Negative for sinus pressure and sore  throat.   Respiratory:  Negative for chest tightness, shortness of breath and wheezing.   Cardiovascular:  Negative for chest pain and palpitations.  Gastrointestinal:  Negative for abdominal distention, abdominal pain and constipation.  Genitourinary: Negative.   Musculoskeletal: Negative.   Skin:  Negative for color change, rash and wound.  Psychiatric/Behavioral:  Negative for behavioral problems and dysphoric mood.     Objective:  BP 138/68   Pulse 66   Temp 98.3 F (36.8 C) (Oral)  Ht 5' 2 (1.575 m)   Wt 153 lb (69.4 kg)   SpO2 96%   BMI 27.98 kg/m      10/08/2024    8:41 AM 04/08/2024    9:40 AM 11/28/2023    9:00 AM  BP/Weight  Systolic BP 138 124 143  Diastolic BP 68 72 76  Wt. (Lbs) 153 152   BMI 27.98 kg/m2 27.8 kg/m2       Physical Exam Constitutional:      Appearance: He is well-developed.  Cardiovascular:     Rate and Rhythm: Normal rate.     Heart sounds: Normal heart sounds. No murmur heard. Pulmonary:     Effort: Pulmonary effort is normal.     Breath sounds: Normal breath sounds. No wheezing or rales.  Chest:     Chest wall: No tenderness.  Abdominal:     General: Bowel sounds are normal. There is no distension.     Palpations: Abdomen is soft. There is no mass.     Tenderness: There is no abdominal tenderness.  Musculoskeletal:        General: Normal range of motion.     Right lower leg: No edema.     Left lower leg: No edema.  Skin:    Comments: Thickened right toenails x5. No tinea pedis in web spaces bilaterally Left eft foot is normal  Neurological:     Mental Status: He is alert and oriented to person, place, and time.  Psychiatric:        Mood and Affect: Mood normal.        Latest Ref Rng & Units 04/08/2024   10:43 AM 10/26/2023   10:46 AM 10/09/2023    9:30 AM  CMP  Glucose 70 - 99 mg/dL 846  858  858   BUN 8 - 27 mg/dL 14  12  17    Creatinine 0.76 - 1.27 mg/dL 9.44  9.46  9.33   Sodium 134 - 144 mmol/L 140  142  144    Potassium 3.5 - 5.2 mmol/L 4.5  4.1  4.7   Chloride 96 - 106 mmol/L 103  102  102   CO2 20 - 29 mmol/L 19  24  25    Calcium  8.6 - 10.2 mg/dL 9.5  9.3  89.8   Total Protein 6.0 - 8.5 g/dL 7.3   8.1   Total Bilirubin 0.0 - 1.2 mg/dL 1.1   1.3   Alkaline Phos 44 - 121 IU/L 143   150   AST 0 - 40 IU/L 21   22   ALT 0 - 44 IU/L 23   24     Lipid Panel     Component Value Date/Time   CHOL 153 04/08/2024 1043   TRIG 229 (H) 04/08/2024 1043   HDL 37 (L) 04/08/2024 1043   CHOLHDL 4.3 08/20/2023 0914   LDLCALC 78 04/08/2024 1043    CBC    Component Value Date/Time   WBC 5.5 10/26/2023 1046   WBC 6.8 11/07/2019 1620   RBC 5.14 10/26/2023 1046   RBC 4.61 11/07/2019 1620   HGB 16.0 10/26/2023 1046   HCT 46.6 10/26/2023 1046   PLT 233 10/26/2023 1046   MCV 91 10/26/2023 1046   MCH 31.1 10/26/2023 1046   MCH 30.4 11/07/2019 1620   MCHC 34.3 10/26/2023 1046   MCHC 32.3 11/07/2019 1620   RDW 12.8 10/26/2023 1046   LYMPHSABS 1.5 05/19/2020 1005   MONOABS 0.2 10/20/2019 0222   EOSABS 0.2  05/19/2020 1005   BASOSABS 0.0 05/19/2020 1005    Lab Results  Component Value Date   HGBA1C 8.6 (A) 10/08/2024    Lab Results  Component Value Date   HGBA1C 8.6 (A) 10/08/2024   HGBA1C 7.9 (A) 04/08/2024   HGBA1C 7.3 (A) 10/09/2023       Assessment & Plan Type 2 diabetes mellitus with diabetic polyneuropathy A1c increased to 8.6, indicating poor glycemic control.  Goal is less than 7.0 symptoms suggest diabetic polyneuropathy. - Increased glipizide  to 5 mg daily from 2.5 mg. - Continue Trulicity , Farxiga , and metformin . - Continue gabapentin  for diabetic neuropathy  Onychomycosis Symptoms consistent with onychomycosis. - Prescribed oral antifungal medication.  Chronic sinusitis Brown discharge suggests sinus issues. - Ordered blood count to check for anemia or bleeding. - Prescribed Xyzal  for sinus secretion. - Prescribed nasal spray for one month.  GERD - Continue PPI -  Will check CBC especially in the setting of chronic sputum production  Hypertension - Controlled - Continue current antihypertensive regimen -Counseled on blood pressure goal of less than 130/80, low-sodium, DASH diet, medication compliance, 150 minutes of moderate intensity exercise per week. Discussed medication compliance, adverse effects.  Dyslipidemia - LDL at goal but triglycerides slightly elevated - Continue statin  General Health Maintenance Encounter for immunization-due for flu vaccination which has been administered      Meds ordered this encounter  Medications   fluticasone  (FLONASE ) 50 MCG/ACT nasal spray    Sig: Place 2 sprays into both nostrils daily.    Dispense:  16 g    Refill:  1   levocetirizine (XYZAL  ALLERGY 24HR) 5 MG tablet    Sig: Take 1 tablet (5 mg total) by mouth every evening.    Dispense:  30 tablet    Refill:  1   glipiZIDE  (GLUCOTROL  XL) 5 MG 24 hr tablet    Sig: Take 1 tablet (5 mg total) by mouth daily with breakfast.    Dispense:  90 tablet    Refill:  1    Dose increase   amLODipine  (NORVASC ) 10 MG tablet    Sig: Take 1 tablet (10 mg total) by mouth daily.    Dispense:  90 tablet    Refill:  1   terbinafine  (LAMISIL ) 250 MG tablet    Sig: Take 1 tablet (250 mg total) by mouth daily.    Dispense:  90 tablet    Refill:  0    Follow-up: Return in about 3 months (around 01/06/2025) for Chronic medical conditions.       Corrina Sabin, MD, FAAFP. [facility] [place], [place] [phone]   10/08/2024, 10:58 AM

## 2024-10-09 ENCOUNTER — Ambulatory Visit: Payer: Self-pay | Admitting: Family Medicine

## 2024-10-09 LAB — CBC WITH DIFFERENTIAL/PLATELET
Basophils Absolute: 0 x10E3/uL (ref 0.0–0.2)
Basos: 1 %
EOS (ABSOLUTE): 0.1 x10E3/uL (ref 0.0–0.4)
Eos: 2 %
Hematocrit: 49.8 % (ref 37.5–51.0)
Hemoglobin: 16.6 g/dL (ref 13.0–17.7)
Immature Grans (Abs): 0 x10E3/uL (ref 0.0–0.1)
Immature Granulocytes: 0 %
Lymphocytes Absolute: 1.7 x10E3/uL (ref 0.7–3.1)
Lymphs: 28 %
MCH: 30.2 pg (ref 26.6–33.0)
MCHC: 33.3 g/dL (ref 31.5–35.7)
MCV: 91 fL (ref 79–97)
Monocytes Absolute: 0.5 x10E3/uL (ref 0.1–0.9)
Monocytes: 8 %
Neutrophils Absolute: 3.7 x10E3/uL (ref 1.4–7.0)
Neutrophils: 61 %
Platelets: 266 x10E3/uL (ref 150–450)
RBC: 5.5 x10E6/uL (ref 4.14–5.80)
RDW: 13.2 % (ref 11.6–15.4)
WBC: 6 x10E3/uL (ref 3.4–10.8)

## 2024-10-09 LAB — BASIC METABOLIC PANEL WITH GFR
BUN/Creatinine Ratio: 23 (ref 10–24)
BUN: 15 mg/dL (ref 8–27)
CO2: 25 mmol/L (ref 20–29)
Calcium: 9.9 mg/dL (ref 8.6–10.2)
Chloride: 101 mmol/L (ref 96–106)
Creatinine, Ser: 0.65 mg/dL — ABNORMAL LOW (ref 0.76–1.27)
Glucose: 146 mg/dL — ABNORMAL HIGH (ref 70–99)
Potassium: 4.3 mmol/L (ref 3.5–5.2)
Sodium: 140 mmol/L (ref 134–144)
eGFR: 105 mL/min/1.73 (ref >59)

## 2024-11-19 ENCOUNTER — Other Ambulatory Visit: Payer: Self-pay

## 2024-11-19 ENCOUNTER — Other Ambulatory Visit: Payer: Self-pay | Admitting: Critical Care Medicine

## 2024-11-21 ENCOUNTER — Other Ambulatory Visit: Payer: Self-pay

## 2024-11-21 ENCOUNTER — Telehealth: Payer: Self-pay

## 2024-11-21 MED ORDER — CLOBETASOL PROPIONATE 0.05 % EX CREA
TOPICAL_CREAM | Freq: Two times a day (BID) | CUTANEOUS | 1 refills | Status: AC
  Filled 2024-11-21: qty 30, 15d supply, fill #0

## 2024-11-21 NOTE — Telephone Encounter (Signed)
 Patient was called and informed of medication refill.

## 2024-11-21 NOTE — Telephone Encounter (Signed)
 Refilled

## 2024-11-21 NOTE — Telephone Encounter (Signed)
 Last filled by Dr. Brien

## 2024-11-21 NOTE — Telephone Encounter (Signed)
 Patient requesting refills on clobetasol  cream (TEMOVATE ) 0.05 %

## 2025-04-08 ENCOUNTER — Ambulatory Visit: Payer: Self-pay | Admitting: Family Medicine
